# Patient Record
Sex: Male | Born: 1975 | Race: Black or African American | Hispanic: No | Marital: Married | Smoking: Former smoker
Health system: Southern US, Community
[De-identification: ages and names within clinical notes are randomized; demographics above are authoritative.]

## PROBLEM LIST (undated history)

## (undated) ENCOUNTER — Emergency Department (HOSPITAL_COMMUNITY): Payer: Managed Care, Other (non HMO)

## (undated) DIAGNOSIS — E119 Type 2 diabetes mellitus without complications: Secondary | ICD-10-CM

## (undated) DIAGNOSIS — L0390 Cellulitis, unspecified: Secondary | ICD-10-CM

## (undated) DIAGNOSIS — D869 Sarcoidosis, unspecified: Secondary | ICD-10-CM

## (undated) DIAGNOSIS — Z87442 Personal history of urinary calculi: Secondary | ICD-10-CM

## (undated) DIAGNOSIS — M549 Dorsalgia, unspecified: Secondary | ICD-10-CM

## (undated) DIAGNOSIS — I10 Essential (primary) hypertension: Secondary | ICD-10-CM

## (undated) HISTORY — DX: Cellulitis, unspecified: L03.90

## (undated) HISTORY — PX: OTHER SURGICAL HISTORY: SHX169

## (undated) HISTORY — DX: Dorsalgia, unspecified: M54.9

---

## 2012-07-08 ENCOUNTER — Encounter (HOSPITAL_BASED_OUTPATIENT_CLINIC_OR_DEPARTMENT_OTHER): Payer: Self-pay | Admitting: Emergency Medicine

## 2012-07-08 ENCOUNTER — Emergency Department (HOSPITAL_BASED_OUTPATIENT_CLINIC_OR_DEPARTMENT_OTHER)
Admission: EM | Admit: 2012-07-08 | Discharge: 2012-07-08 | Disposition: A | Discharge status: Home / Self Care | Payer: Self-pay | Attending: Emergency Medicine | Admitting: Emergency Medicine

## 2012-07-08 DIAGNOSIS — R109 Unspecified abdominal pain: Secondary | ICD-10-CM | POA: Insufficient documentation

## 2012-07-08 DIAGNOSIS — E860 Dehydration: Secondary | ICD-10-CM | POA: Insufficient documentation

## 2012-07-08 DIAGNOSIS — R197 Diarrhea, unspecified: Secondary | ICD-10-CM | POA: Insufficient documentation

## 2012-07-08 DIAGNOSIS — K529 Noninfective gastroenteritis and colitis, unspecified: Secondary | ICD-10-CM

## 2012-07-08 DIAGNOSIS — K5289 Other specified noninfective gastroenteritis and colitis: Secondary | ICD-10-CM | POA: Insufficient documentation

## 2012-07-08 LAB — CBC WITH DIFFERENTIAL/PLATELET
HCT: 47.2 % (ref 39.0–52.0)
Hemoglobin: 16.2 g/dL (ref 13.0–17.0)
Lymphocytes Relative: 38 % (ref 12–46)
Lymphs Abs: 1.9 10*3/uL (ref 0.7–4.0)
Monocytes Relative: 18 % — ABNORMAL HIGH (ref 3–12)
Neutro Abs: 2.2 10*3/uL (ref 1.7–7.7)
Neutrophils Relative %: 44 % (ref 43–77)
RBC: 6.17 MIL/uL — ABNORMAL HIGH (ref 4.22–5.81)

## 2012-07-08 LAB — URINALYSIS, ROUTINE W REFLEX MICROSCOPIC
Glucose, UA: NEGATIVE mg/dL
Leukocytes, UA: NEGATIVE
Nitrite: NEGATIVE
Specific Gravity, Urine: 1.031 — ABNORMAL HIGH (ref 1.005–1.030)
pH: 5.5 (ref 5.0–8.0)

## 2012-07-08 LAB — URINE MICROSCOPIC-ADD ON

## 2012-07-08 LAB — BASIC METABOLIC PANEL
BUN: 10 mg/dL (ref 6–23)
CO2: 25 mEq/L (ref 19–32)
Chloride: 100 mEq/L (ref 96–112)
GFR calc Af Amer: 82 mL/min — ABNORMAL LOW (ref >90)
Glucose, Bld: 99 mg/dL (ref 70–99)
Potassium: 4 mEq/L (ref 3.5–5.1)

## 2012-07-08 MED ORDER — IBUPROFEN 600 MG PO TABS: 600.0000 mg | ORAL_TABLET | Freq: Four times a day (QID) | ORAL | Status: DC | PRN

## 2012-07-08 MED ORDER — SODIUM CHLORIDE 0.9 % IV BOLUS (SEPSIS)
1000.0000 mL | Freq: Once | INTRAVENOUS | Status: AC
  Administered 2012-07-08: 1000 mL via INTRAVENOUS

## 2012-07-08 NOTE — ED Notes (Signed)
Vomiting and diarrhea since 07/06/12.  Also c/o chills, body aches, and fever on 07/06/12.  Pt. has taken Dayquil and Nyquil without relief.  Pt's son dx. with viral illness last Friday.

## 2012-07-08 NOTE — ED Provider Notes (Signed)
History     CSN: [id number]  Arrival date & time 07/08/12  1009   First MD Initiated Contact with Patient 07/08/12 1024      Chief Complaint  Patient presents with  . Emesis  . Diarrhea    (Consider location/radiation/quality/duration/timing/severity/associated sxs/prior treatment) HPI Comments: 37 y/o male with no medical hx comes in with cc of diarrhea, emesis. Pt started getting on Saturday. He has had between 5-10 loose, watery, non bloody BM since then, last BM being last night and about 2 episodes of emesis. He has no nausea, no hx of recent AB. Pt does have children with similar complains in the house.\ Pt has poor appetite, and does admit to feeling slightly dizzy when he gets up.  Patient is a 38 y.o. male presenting with vomiting and diarrhea. The history is provided by the patient.  Emesis  Associated symptoms include abdominal pain and diarrhea. Pertinent negatives include no cough.  Diarrhea The primary symptoms include fatigue, abdominal pain, vomiting and diarrhea. Primary symptoms do not include dysuria.    History reviewed. No pertinent past medical history.  History reviewed. No pertinent past surgical history.  No family history on file.  History  Substance Use Topics  . Smoking status: Never Smoker   . Smokeless tobacco: Not on file  . Alcohol Use: No      Review of Systems  Constitutional: Positive for fatigue. Negative for activity change and appetite change.  Respiratory: Negative for cough and shortness of breath.   Cardiovascular: Negative for chest pain.  Gastrointestinal: Positive for vomiting, abdominal pain and diarrhea.  Genitourinary: Negative for dysuria.  Neurological: Positive for dizziness.    Allergies  Review of patient's allergies indicates not on file.  Home Medications   Current Outpatient Rx  Name  Route  Sig  Dispense  Refill  . IBUPROFEN 600 MG PO TABS   Oral   Take 1 tablet (600 mg total) by mouth every 6 (six)  hours as needed for pain.   30 tablet   0     BP 123/93  Pulse 88  Temp 98.6 F (37 C) (Oral)  Resp 18  Ht 5\' 11"  (1.803 m)  Wt 204 lb (92.534 kg)  BMI 28.45 kg/m2  SpO2 100%  Physical Exam  Nursing note and vitals reviewed. Constitutional: He is oriented to person, place, and time. He appears well-developed.  HENT:  Head: Normocephalic and atraumatic.  Eyes: Conjunctivae normal and EOM are normal. Pupils are equal, round, and reactive to light.  Neck: Normal range of motion. Neck supple.  Cardiovascular: Normal rate and regular rhythm.   Pulmonary/Chest: Effort normal and breath sounds normal.  Abdominal: Soft. Bowel sounds are normal. He exhibits no distension. There is tenderness. There is no rebound and no guarding.       Pt has some lower quadrant, diffuse tenderness, mild - no rebound or guarding.  Neurological: He is alert and oriented to person, place, and time.  Skin: Skin is warm.    ED Course  Procedures (including critical care time)  Labs Reviewed  URINALYSIS, ROUTINE W REFLEX MICROSCOPIC - Abnormal; Notable for the following:    Specific Gravity, Urine 1.031 (*)     Hgb urine dipstick SMALL (*)     Bilirubin Urine SMALL (*)     Ketones, ur 15 (*)     Protein, ur 30 (*)     All other components within normal limits  CBC WITH DIFFERENTIAL - Abnormal; Notable for the following:  RBC 6.17 (*)     MCV 76.5 (*)     Monocytes Relative 18 (*)     All other components within normal limits  BASIC METABOLIC PANEL - Abnormal; Notable for the following:    GFR calc non Af Amer 70 (*)     GFR calc Af Amer 82 (*)     All other components within normal limits  URINE MICROSCOPIC-ADD ON - Abnormal; Notable for the following:    Bacteria, UA FEW (*)     Casts WBC CAST (*)     All other components within normal limits  URINE CULTURE   No results found.   1. Gastroenteritis   2. Dehydration       MDM  Pt comes in with cc of abd pain. Pt has diarrhea,  some emesis. He appears slightly dehydrated - we will hydrate in the ED. No emesis since Saturday night. No indication for stool studies per hx at this time, unless the [facility] is severely elevated -  No antibiotics.   Derwood Kaplan, MD 07/08/12 1138

## 2012-07-08 NOTE — ED Notes (Signed)
Pt. also c/o right shoulder pain that radiates to right lower back area since yesterday.

## 2012-07-10 LAB — URINE CULTURE: Culture: NO GROWTH

## 2012-09-19 ENCOUNTER — Emergency Department (HOSPITAL_BASED_OUTPATIENT_CLINIC_OR_DEPARTMENT_OTHER)
Admission: EM | Admit: 2012-09-19 | Discharge: 2012-09-19 | Disposition: A | Discharge status: Home / Self Care | Payer: Self-pay | Attending: Emergency Medicine | Admitting: Emergency Medicine

## 2012-09-19 ENCOUNTER — Encounter (HOSPITAL_BASED_OUTPATIENT_CLINIC_OR_DEPARTMENT_OTHER): Payer: Self-pay | Admitting: Emergency Medicine

## 2012-09-19 DIAGNOSIS — Y9389 Activity, other specified: Secondary | ICD-10-CM | POA: Insufficient documentation

## 2012-09-19 DIAGNOSIS — X500XXA Overexertion from strenuous movement or load, initial encounter: Secondary | ICD-10-CM | POA: Insufficient documentation

## 2012-09-19 DIAGNOSIS — Z87828 Personal history of other (healed) physical injury and trauma: Secondary | ICD-10-CM | POA: Insufficient documentation

## 2012-09-19 DIAGNOSIS — Y92009 Unspecified place in unspecified non-institutional (private) residence as the place of occurrence of the external cause: Secondary | ICD-10-CM | POA: Insufficient documentation

## 2012-09-19 DIAGNOSIS — S39012A Strain of muscle, fascia and tendon of lower back, initial encounter: Secondary | ICD-10-CM

## 2012-09-19 DIAGNOSIS — S335XXA Sprain of ligaments of lumbar spine, initial encounter: Secondary | ICD-10-CM | POA: Insufficient documentation

## 2012-09-19 MED ORDER — HYDROCODONE-ACETAMINOPHEN 5-325 MG PO TABS: 1.0000 | ORAL_TABLET | ORAL | Status: DC | PRN

## 2012-09-19 MED ORDER — CYCLOBENZAPRINE HCL 10 MG PO TABS: 10.0000 mg | ORAL_TABLET | Freq: Three times a day (TID) | ORAL | Status: DC | PRN

## 2012-09-19 NOTE — ED Notes (Signed)
MD at bedside. 
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
## 2013-01-13 ENCOUNTER — Encounter (HOSPITAL_BASED_OUTPATIENT_CLINIC_OR_DEPARTMENT_OTHER): Payer: Self-pay

## 2013-01-13 ENCOUNTER — Emergency Department (HOSPITAL_BASED_OUTPATIENT_CLINIC_OR_DEPARTMENT_OTHER)
Admission: EM | Admit: 2013-01-13 | Discharge: 2013-01-13 | Disposition: A | Discharge status: Home / Self Care | Payer: Self-pay | Attending: Emergency Medicine | Admitting: Emergency Medicine

## 2013-01-13 DIAGNOSIS — K029 Dental caries, unspecified: Secondary | ICD-10-CM | POA: Insufficient documentation

## 2013-01-13 DIAGNOSIS — K089 Disorder of teeth and supporting structures, unspecified: Secondary | ICD-10-CM | POA: Insufficient documentation

## 2013-01-13 MED ORDER — PENICILLIN V POTASSIUM 500 MG PO TABS: 500.0000 mg | ORAL_TABLET | Freq: Four times a day (QID) | ORAL | Status: DC

## 2013-01-13 MED ORDER — HYDROCODONE-ACETAMINOPHEN 5-325 MG PO TABS: ORAL_TABLET | ORAL | Status: DC

## 2013-01-13 MED ORDER — IBUPROFEN 800 MG PO TABS: 800.0000 mg | ORAL_TABLET | Freq: Three times a day (TID) | ORAL | Status: DC

## 2013-01-13 NOTE — ED Provider Notes (Signed)
Medical screening examination/treatment/procedure(s) were performed by non-physician practitioner and as supervising physician I was immediately available for consultation/collaboration.  Doug Sou, MD 01/13/13 1137

## 2013-01-13 NOTE — ED Provider Notes (Signed)
CSN: [id number]     Arrival date & time 01/13/13  1007 History     First MD Initiated Contact with Patient 01/13/13 1009     Chief Complaint  Patient presents with  . Dental Pain   (Consider location/radiation/quality/duration/timing/severity/associated sxs/prior Treatment) HPI Pt is a 37yo male c/o 1wk hx of left lower dental pain that is aching and throbbing, 10/10, worse with chewing.  States he did see dentist in [place] which quoted him amount for tooth extraction.  Pt states it is too expensive right now, he does get paid next week and believes he can go then to have to taken out.  Would like some pain medication to get him through until he gets paid.  Denies fever, n/v/d. Denies difficulty breathing or swallowing.    History reviewed. No pertinent past medical history. History reviewed. No pertinent past surgical history. No family history on file. History  Substance Use Topics  . Smoking status: Never Smoker   . Smokeless tobacco: Not on file  . Alcohol Use: No    Review of Systems  Constitutional: Negative for fever and chills.  HENT: Positive for dental problem.   All other systems reviewed and are negative.    Allergies  Review of patient's allergies indicates no known allergies.  Home Medications   Current Outpatient Rx  Name  Route  Sig  Dispense  Refill  . HYDROcodone-acetaminophen (NORCO/VICODIN) 5-325 MG per tablet      Take 1-2 pills every 4-6 hours as needed for pain   10 tablet   0   . ibuprofen (ADVIL,MOTRIN) 800 MG tablet   Oral   Take 1 tablet (800 mg total) by mouth 3 (three) times daily.   21 tablet   0   . penicillin v potassium (VEETID) 500 MG tablet   Oral   Take 1 tablet (500 mg total) by mouth 4 (four) times daily.   40 tablet   0    BP 121/70  Pulse 60  Temp(Src) 98.4 F (36.9 C) (Oral)  Resp 18  Ht 5\' 11"  (1.803 m)  Wt 202 lb (91.627 kg)  BMI 28.19 kg/m2  SpO2 100% Physical Exam  Nursing note and vitals  reviewed. Constitutional: He is oriented to person, place, and time. He appears well-developed and well-nourished.  HENT:  Head: Normocephalic and atraumatic.  Right Ear: Hearing, tympanic membrane, external ear and ear canal normal.  Left Ear: Hearing, tympanic membrane, external ear and ear canal normal.  Nose: Nose normal.  Mouth/Throat: Uvula is midline and oropharynx is clear and moist. Abnormal dentition. Dental caries present. No dental abscesses or edematous.    Tooth #18, cracked down to dentin.  No obvious abscess. No bleeding or drainage.  TTP.  Eyes: EOM are normal.  Neck: Normal range of motion.  Cardiovascular: Normal rate.   Pulmonary/Chest: Effort normal.  Musculoskeletal: Normal range of motion.  Neurological: He is alert and oriented to person, place, and time.  Skin: Skin is warm and dry.  Psychiatric: He has a normal mood and affect. His behavior is normal.    ED Course   Procedures (including critical care time)  Labs Reviewed - No data to display No results found. 1. Pain due to dental caries     MDM  Pt has dental pain with cracked tooth, waiting to get paid to have it removed.   Rx: norco and PCN. Will discharge pt home and have her f/u with [facility] and [facility] info provided. Return  precautions given. Dental References provided. Pt verbalized understanding and agreement with tx plan. Vitals: unremarkable. Discharged in stable condition.      Junius Finner, PA-C 01/13/13 1100

## 2013-01-13 NOTE — ED Notes (Signed)
Pt reports dental pain x 1 week unrelieved after taking Tylenol and Motrin.

## 2013-04-10 ENCOUNTER — Emergency Department (HOSPITAL_BASED_OUTPATIENT_CLINIC_OR_DEPARTMENT_OTHER)
Admission: EM | Admit: 2013-04-10 | Discharge: 2013-04-10 | Disposition: A | Discharge status: Home / Self Care | Payer: Self-pay | Attending: Emergency Medicine | Admitting: Emergency Medicine

## 2013-04-10 ENCOUNTER — Emergency Department (HOSPITAL_BASED_OUTPATIENT_CLINIC_OR_DEPARTMENT_OTHER): Payer: Self-pay

## 2013-04-10 ENCOUNTER — Encounter (HOSPITAL_BASED_OUTPATIENT_CLINIC_OR_DEPARTMENT_OTHER): Payer: Self-pay | Admitting: Emergency Medicine

## 2013-04-10 DIAGNOSIS — Z792 Long term (current) use of antibiotics: Secondary | ICD-10-CM | POA: Insufficient documentation

## 2013-04-10 DIAGNOSIS — R51 Headache: Secondary | ICD-10-CM | POA: Insufficient documentation

## 2013-04-10 DIAGNOSIS — Z791 Long term (current) use of non-steroidal anti-inflammatories (NSAID): Secondary | ICD-10-CM | POA: Insufficient documentation

## 2013-04-10 DIAGNOSIS — R52 Pain, unspecified: Secondary | ICD-10-CM | POA: Insufficient documentation

## 2013-04-10 DIAGNOSIS — R1110 Vomiting, unspecified: Secondary | ICD-10-CM | POA: Insufficient documentation

## 2013-04-10 MED ORDER — DIPHENHYDRAMINE HCL 50 MG/ML IJ SOLN
25.0000 mg | Freq: Once | INTRAMUSCULAR | Status: AC
  Administered 2013-04-10: 25 mg via INTRAVENOUS
  Filled 2013-04-10: qty 1

## 2013-04-10 MED ORDER — SODIUM CHLORIDE 0.9 % IV BOLUS (SEPSIS)
1000.0000 mL | Freq: Once | INTRAVENOUS | Status: AC
  Administered 2013-04-10: 1000 mL via INTRAVENOUS

## 2013-04-10 MED ORDER — METOCLOPRAMIDE HCL 5 MG/ML IJ SOLN
10.0000 mg | Freq: Once | INTRAMUSCULAR | Status: AC
  Administered 2013-04-10: 10 mg via INTRAVENOUS
  Filled 2013-04-10: qty 2

## 2013-04-10 NOTE — ED Notes (Signed)
Sudden onset of headache. He vomited afterward. Took Aleve but vomited afterward.

## 2013-04-10 NOTE — ED Provider Notes (Signed)
CSN: [id number]     Arrival date & time 04/10/13  1142 History   First MD Initiated Contact with Patient 04/10/13 1155     Chief Complaint  Patient presents with  . Headache   (Consider location/radiation/quality/duration/timing/severity/associated sxs/prior Treatment) HPI Comments: 37 year old male with an acute headache started about 2 hours ago. He was walking in the store looking for new socks for his son when the pain hit him all of a sudden. He states the pain increased in intensity over the next hour. When he got home the pain was as worst. It was  at this point was generalized. He took Aleve and then subsequently vomited. Since has not felt any nausea. The pain has subsided down to a 7 or 8 at this time. His mostly located on his right parietal head. Denies any neck tenderness, neck pain, photophobia, or blurry vision. Denies any fevers or chills. Denies any weakness or numbness. His grandfather died of an aneurysm recently.   History reviewed. No pertinent past medical history. History reviewed. No pertinent past surgical history. No family history on file. History  Substance Use Topics  . Smoking status: Never Smoker   . Smokeless tobacco: Not on file  . Alcohol Use: Yes     Comment: occassionally     Review of Systems  Constitutional: Negative for fever and chills.  Gastrointestinal: Positive for vomiting (once). Negative for nausea.  Musculoskeletal: Negative for neck pain and neck stiffness.  Neurological: Positive for headaches. Negative for syncope, speech difficulty, weakness and numbness.  All other systems reviewed and are negative.    Allergies  Review of patient's allergies indicates no known allergies.  Home Medications   Current Outpatient Rx  Name  Route  Sig  Dispense  Refill  . HYDROcodone-acetaminophen (NORCO/VICODIN) 5-325 MG per tablet      Take 1-2 pills every 4-6 hours as needed for pain   10 tablet   0   . ibuprofen (ADVIL,MOTRIN) 800 MG  tablet   Oral   Take 1 tablet (800 mg total) by mouth 3 (three) times daily.   21 tablet   0   . penicillin v potassium (VEETID) 500 MG tablet   Oral   Take 1 tablet (500 mg total) by mouth 4 (four) times daily.   40 tablet   0    BP 138/88  Pulse 79  Temp(Src) 98.4 F (36.9 C) (Oral)  Resp 16  SpO2 100% Physical Exam  Nursing note and vitals reviewed. Constitutional: He is oriented to person, place, and time. He appears well-developed and well-nourished. No distress.  HENT:  Head: Normocephalic and atraumatic.  Right Ear: External ear normal.  Left Ear: External ear normal.  Nose: Nose normal.  Mouth/Throat: Oropharynx is clear and moist. No oropharyngeal exudate.  No tenderness to head and scalp  Eyes: EOM are normal. Pupils are equal, round, and reactive to light. Right eye exhibits no discharge. Left eye exhibits no discharge.  Neck: Normal range of motion and full passive range of motion without pain. Neck supple. No rigidity.  Cardiovascular: Normal rate, regular rhythm, normal heart sounds and intact distal pulses.   Pulmonary/Chest: Effort normal and breath sounds normal.  Abdominal: Soft. There is no tenderness.  Musculoskeletal: He exhibits no edema.  Neurological: He is alert and oriented to person, place, and time. He has normal strength. No cranial nerve deficit or sensory deficit. He exhibits normal muscle tone. GCS eye subscore is 4. GCS verbal subscore is 5. GCS motor  subscore is 6.  Skin: Skin is warm and dry.    ED Course  Procedures (including critical care time) Labs Review Labs Reviewed - No data to display Imaging Review Ct Head Wo Contrast  04/10/2013   CLINICAL DATA:  Sudden onset headache. Family history of aneurysm.  EXAM: CT HEAD WITHOUT CONTRAST  TECHNIQUE: Contiguous axial images were obtained from the base of the skull through the vertex without intravenous contrast.  COMPARISON:  None.  FINDINGS: No acute intracranial hemorrhage. No focal  mass lesion. No CT evidence of acute infarction. No midline shift or mass effect. No hydrocephalus. Basilar cisterns are patent. Paranasal sinuses and mastoid air cells are clear.  IMPRESSION: Normal head CT.   Electronically Signed   By: Genevive Bi M.D.   On: 04/10/2013 12:48    EKG Interpretation   None       MDM   1. Acute headache    Patient appears well here, headache improved significantly prior to arrival. CT head neg for acute blood. Unlikely to be infectious cause based on history and exam. Pain resolved with treatment. I discussed good sensitivity of CT but to fully r/o SAH would need LP. Patient declines and understands that this involves small risk of missing a small SAH. Discussed return precautions with patient who verbalized understanding.    Audree Camel, MD 04/11/13 1005

## 2013-08-30 ENCOUNTER — Encounter (HOSPITAL_BASED_OUTPATIENT_CLINIC_OR_DEPARTMENT_OTHER): Payer: Self-pay | Admitting: Emergency Medicine

## 2013-08-30 ENCOUNTER — Emergency Department (HOSPITAL_BASED_OUTPATIENT_CLINIC_OR_DEPARTMENT_OTHER)
Admission: EM | Admit: 2013-08-30 | Discharge: 2013-08-30 | Disposition: A | Discharge status: Home / Self Care | Payer: Self-pay | Attending: Emergency Medicine | Admitting: Emergency Medicine

## 2013-08-30 DIAGNOSIS — G8929 Other chronic pain: Secondary | ICD-10-CM | POA: Insufficient documentation

## 2013-08-30 DIAGNOSIS — M545 Low back pain, unspecified: Secondary | ICD-10-CM | POA: Insufficient documentation

## 2013-08-30 DIAGNOSIS — H0289 Other specified disorders of eyelid: Secondary | ICD-10-CM | POA: Insufficient documentation

## 2013-08-30 DIAGNOSIS — H029 Unspecified disorder of eyelid: Secondary | ICD-10-CM

## 2013-08-30 DIAGNOSIS — M549 Dorsalgia, unspecified: Secondary | ICD-10-CM

## 2013-08-30 MED ORDER — TOBRAMYCIN-DEXAMETHASONE 0.3-0.1 % OP SUSP: 1.0000 [drp] | OPHTHALMIC | Status: DC

## 2013-08-30 MED ORDER — TRAMADOL HCL 50 MG PO TABS: 50.0000 mg | ORAL_TABLET | Freq: Four times a day (QID) | ORAL | Status: DC | PRN

## 2013-08-30 MED ORDER — NAPROXEN 500 MG PO TABS: 500.0000 mg | ORAL_TABLET | Freq: Two times a day (BID) | ORAL | Status: DC

## 2013-08-30 MED ORDER — METHOCARBAMOL 500 MG PO TABS: 500.0000 mg | ORAL_TABLET | Freq: Two times a day (BID) | ORAL | Status: DC

## 2013-08-30 NOTE — ED Notes (Signed)
MD at bedside. 

## 2013-08-30 NOTE — ED Notes (Signed)
Patient here with right upper eyelid swelling x 1 month, denies pain, denies trauma. Also complains of ongoing lower back pain, reports herniated disc that is acting up, no deficits noted
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
## 2013-08-30 NOTE — Discharge Instructions (Signed)
You have a mass protruding from the tissue of the right eyelid that'll require ophthalmology evaluation. Use the drops as recommended until your ophthalmology appointment. Clinic given to you as a followup for pain management is one of several in town. You may choose any you like to contact for followup  Chronic Back Pain  When back pain lasts longer than 3 months, it is called chronic back pain.People with chronic back pain often go through certain periods that are more intense (flare-ups).  CAUSES Chronic back pain can be caused by wear and tear (degeneration) on different structures in your back. These structures include:  The bones of your spine (vertebrae) and the joints surrounding your spinal cord and nerve roots (facets).  The strong, fibrous tissues that connect your vertebrae (ligaments). Degeneration of these structures may result in pressure on your nerves. This can lead to constant pain. HOME CARE INSTRUCTIONS  Avoid bending, heavy lifting, prolonged sitting, and activities which make the problem worse.  Take brief periods of rest throughout the day to reduce your pain. Lying down or standing usually is better than sitting while you are resting.  Take over-the-counter or prescription medicines only as directed by your caregiver. SEEK IMMEDIATE MEDICAL CARE IF:   You have weakness or numbness in one of your legs or feet.  You have trouble controlling your bladder or bowels.  You have nausea, vomiting, abdominal pain, shortness of breath, or fainting. Document Released: 07/13/2004 Document Revised: 08/28/2011 Document Reviewed: 05/20/2011 [facility] Patient Information 2014 [place], [place].

## 2014-10-03 ENCOUNTER — Encounter (HOSPITAL_BASED_OUTPATIENT_CLINIC_OR_DEPARTMENT_OTHER): Payer: Self-pay

## 2014-10-03 ENCOUNTER — Emergency Department (HOSPITAL_BASED_OUTPATIENT_CLINIC_OR_DEPARTMENT_OTHER)
Admission: EM | Admit: 2014-10-03 | Discharge: 2014-10-03 | Disposition: A | Discharge status: Home / Self Care | Payer: Self-pay | Attending: Emergency Medicine | Admitting: Emergency Medicine

## 2014-10-03 DIAGNOSIS — H5711 Ocular pain, right eye: Secondary | ICD-10-CM | POA: Insufficient documentation

## 2014-10-03 DIAGNOSIS — R630 Anorexia: Secondary | ICD-10-CM | POA: Insufficient documentation

## 2014-10-03 DIAGNOSIS — B349 Viral infection, unspecified: Secondary | ICD-10-CM | POA: Insufficient documentation

## 2014-10-03 DIAGNOSIS — Z79899 Other long term (current) drug therapy: Secondary | ICD-10-CM | POA: Insufficient documentation

## 2014-10-03 DIAGNOSIS — Z791 Long term (current) use of non-steroidal anti-inflammatories (NSAID): Secondary | ICD-10-CM | POA: Insufficient documentation

## 2014-10-03 DIAGNOSIS — Z720 Tobacco use: Secondary | ICD-10-CM | POA: Insufficient documentation

## 2014-10-03 HISTORY — DX: Sarcoidosis, unspecified: D86.9

## 2014-10-03 MED ORDER — IBUPROFEN 800 MG PO TABS
800.0000 mg | ORAL_TABLET | Freq: Once | ORAL | Status: AC
  Administered 2014-10-03: 800 mg via ORAL
  Filled 2014-10-03: qty 1

## 2014-10-03 NOTE — ED Provider Notes (Signed)
CSN: [id number]     Arrival date & time 10/03/14  1904 History  This chart was scribed for Jerelyn Scott, MD by Abel Presto, ED Scribe. This patient was seen in room MH05/MH05 and the patient's care was started at 10:03 PM.    Chief Complaint  Patient presents with  . Fever    Patient is a 39 y.o. male presenting with fever. The history is provided by the patient. No language interpreter was used.  Fever Associated symptoms: chills, diarrhea and myalgias   Associated symptoms: no cough, no sore throat and no vomiting    HPI Comments: Brett Allen is a 39 y.o. male who presents to the Emergency Department complaining of subjective fever with onset 3 days ago. Pt notes associated bilateral eye pain, watery diarrhea x3 with onset today, body aches, chills, night sweats, and decreased appetite. Pt able to tolerate PO food and fluid intake. Pt has tried Nyquil for relief. Pt was seen the day before and given an injection in his right eye to treat sarcoidosis. Pt notes recent sick contacts. Pt denies visual changes, vomiting, sore throat, abdominal pain, and cough. Pt reports he is feeling better currently.   Past Medical History  Diagnosis Date  . Sarcoidosis     right eye   History reviewed. No pertinent past surgical history. History reviewed. No pertinent family history. History  Substance Use Topics  . Smoking status: Current Every Day Smoker  . Smokeless tobacco: Not on file  . Alcohol Use: Yes     Comment: occassionally     Review of Systems  Constitutional: Positive for fever, chills and appetite change.  HENT: Negative for sore throat.   Eyes: Positive for pain.  Respiratory: Negative for cough.   Gastrointestinal: Positive for diarrhea. Negative for vomiting and abdominal pain.  Musculoskeletal: Positive for myalgias.  ROS reviewed and all otherwise negative except for mentioned in HPI    Allergies  Review of patient's allergies indicates no known  allergies.  Home Medications   Prior to Admission medications   Medication Sig Start Date End Date Taking? Authorizing Provider  methocarbamol (ROBAXIN) 500 MG tablet Take 1 tablet (500 mg total) by mouth 2 (two) times daily. 08/30/13   Rolland Porter, MD  naproxen (NAPROSYN) 500 MG tablet Take 1 tablet (500 mg total) by mouth 2 (two) times daily. 08/30/13   Rolland Porter, MD  tobramycin-dexamethasone [facility]) ophthalmic solution Place 1 drop into the right eye every 4 (four) hours while awake. 08/30/13   Rolland Porter, MD  traMADol (ULTRAM) 50 MG tablet Take 1 tablet (50 mg total) by mouth every 6 (six) hours as needed. 08/30/13   Rolland Porter, MD   BP 127/85 mmHg  Pulse 96  Temp(Src) 101.4 F (38.6 C) (Oral)  Resp 16  Ht  (1.803 m)  Wt 195 lb (88.451 kg)  BMI 27.21 kg/m2  SpO2 99% Physical Exam  Constitutional: He is oriented to person, place, and time. He appears well-developed and well-nourished.  HENT:  Head: Normocephalic.  Eyes: Conjunctivae are normal.  Neck: Normal range of motion. Neck supple.  Pulmonary/Chest: Effort normal.  Musculoskeletal: Normal range of motion.  Neurological: He is alert and oriented to person, place, and time.  Skin: Skin is warm and dry.  Psychiatric: He has a normal mood and affect. His behavior is normal.  Nursing note and vitals reviewed. Physical Examination: General appearance - alert, well appearing, and in no distress Mental status - alert, oriented to person, place,  and time Eyes - pupils equal and reactive, extraocular eye movements intact,no conjunctival injection no scleral icterus Mouth - mucous membranes moist, pharynx normal without lesions Chest - clear to auscultation, no wheezes, rales or rhonchi, symmetric air entry Heart - normal rate, regular rhythm, normal S1, S2, no murmurs, rubs, clicks or gallops Abdomen - soft, nontender, nondistended, no masses or organomegaly, nabs Extremities - peripheral pulses normal, no pedal edema, no  clubbing or cyanosis Skin - normal coloration and turgor, no rashes  ED Course  Procedures (including critical care time) DIAGNOSTIC STUDIES: Oxygen Saturation is 99% on room air, normal by my interpretation.    COORDINATION OF CARE: 10:13 PM Discussed treatment plan with patient at beside, the patient agrees with the plan and has no further questions at this time.   Labs Review Labs Reviewed - No data to display  Imaging Review No results found.   EKG Interpretation None     MDM   Final diagnoses:  Viral infection   Pt presenting with c/o diffuse myalgias, diarrhea, subjective fever/chills- pt had steroid injection in right eye several days ago for sarcoidosis- no changes in vision, eye exam is reassuring.  Doubt this is related to steroids- more likely viral infection.   Pt feels improved after ibuprofen.  D/w patient about symptomatic care, rest, OTC meds.  Discharged with strict return precautions.  Pt agreeable with plan.  I personally performed the services described in this documentation, which was scribed in my presence. The recorded information has been reviewed and is accurate.     Jerelyn ScottMartha Linker, MD 10/04/14 2120

## 2014-10-03 NOTE — ED Notes (Signed)
Pt with fever, night sweats, body aches, headache - pt thinks he is having an allergic reaction to a steroid injection on Monday in his right eye for sarcoidosis - states he forgot the name of medication.

## 2014-10-03 NOTE — Discharge Instructions (Signed)
Return to the ED with any concerns including difficulty breathing, vomiting and not able to keep down liquids, abdominal pain- especially if it localizes to the right lower abdomen, changes in vision, decreased level of alertness/lethargy, or any other alarming symptoms

## 2015-06-20 HISTORY — PX: WRIST FRACTURE SURGERY: SHX121

## 2015-07-09 DIAGNOSIS — E785 Hyperlipidemia, unspecified: Secondary | ICD-10-CM | POA: Insufficient documentation

## 2015-07-23 DIAGNOSIS — M545 Low back pain: Secondary | ICD-10-CM

## 2015-07-23 DIAGNOSIS — G8929 Other chronic pain: Secondary | ICD-10-CM | POA: Insufficient documentation

## 2015-10-29 DIAGNOSIS — D8689 Sarcoidosis of other sites: Secondary | ICD-10-CM | POA: Insufficient documentation

## 2016-10-07 ENCOUNTER — Encounter (HOSPITAL_BASED_OUTPATIENT_CLINIC_OR_DEPARTMENT_OTHER): Payer: Self-pay | Admitting: *Deleted

## 2016-10-07 ENCOUNTER — Emergency Department (HOSPITAL_BASED_OUTPATIENT_CLINIC_OR_DEPARTMENT_OTHER)
Admission: EM | Admit: 2016-10-07 | Discharge: 2016-10-07 | Disposition: A | Discharge status: Home / Self Care | Payer: Self-pay | Attending: Emergency Medicine | Admitting: Emergency Medicine

## 2016-10-07 DIAGNOSIS — F17200 Nicotine dependence, unspecified, uncomplicated: Secondary | ICD-10-CM | POA: Insufficient documentation

## 2016-10-07 DIAGNOSIS — H8112 Benign paroxysmal vertigo, left ear: Secondary | ICD-10-CM | POA: Insufficient documentation

## 2016-10-07 DIAGNOSIS — I10 Essential (primary) hypertension: Secondary | ICD-10-CM | POA: Insufficient documentation

## 2016-10-07 HISTORY — DX: Essential (primary) hypertension: I10

## 2016-10-07 LAB — COMPREHENSIVE METABOLIC PANEL
ALK PHOS: 63 U/L (ref 38–126)
ALT: 64 U/L — ABNORMAL HIGH (ref 17–63)
AST: 78 U/L — ABNORMAL HIGH (ref 15–41)
Albumin: 4.5 g/dL (ref 3.5–5.0)
Anion gap: 14 (ref 5–15)
BUN: 20 mg/dL (ref 6–20)
CALCIUM: 9.6 mg/dL (ref 8.9–10.3)
CO2: 24 mmol/L (ref 22–32)
CREATININE: 1.45 mg/dL — AB (ref 0.61–1.24)
Chloride: 100 mmol/L — ABNORMAL LOW (ref 101–111)
GFR, EST NON AFRICAN AMERICAN: 60 mL/min — AB (ref >60)
Glucose, Bld: 119 mg/dL — ABNORMAL HIGH (ref 65–99)
Potassium: 3.4 mmol/L — ABNORMAL LOW (ref 3.5–5.1)
SODIUM: 138 mmol/L (ref 135–145)
TOTAL PROTEIN: 8.3 g/dL — AB (ref 6.5–8.1)
Total Bilirubin: 0.6 mg/dL (ref 0.3–1.2)

## 2016-10-07 LAB — CBC WITH DIFFERENTIAL/PLATELET
BASOS ABS: 0 10*3/uL (ref 0.0–0.1)
Basophils Relative: 0 %
EOS PCT: 1 %
Eosinophils Absolute: 0.1 10*3/uL (ref 0.0–0.7)
HCT: 46.4 % (ref 39.0–52.0)
HEMOGLOBIN: 15.8 g/dL (ref 13.0–17.0)
LYMPHS ABS: 3.3 10*3/uL (ref 0.7–4.0)
Lymphocytes Relative: 56 %
MCH: 26.4 pg (ref 26.0–34.0)
MCHC: 34.1 g/dL (ref 30.0–36.0)
MCV: 77.6 fL — AB (ref 78.0–100.0)
Monocytes Absolute: 0.6 10*3/uL (ref 0.1–1.0)
Monocytes Relative: 10 %
NEUTROS PCT: 33 %
Neutro Abs: 1.9 10*3/uL (ref 1.7–7.7)
PLATELETS: 284 10*3/uL (ref 150–400)
RBC: 5.98 MIL/uL — AB (ref 4.22–5.81)
RDW: 14.2 % (ref 11.5–15.5)
WBC: 5.9 10*3/uL (ref 4.0–10.5)

## 2016-10-07 LAB — TROPONIN I: Troponin I: <0.03 ng/mL (ref <0.03)

## 2016-10-07 MED ORDER — MECLIZINE HCL 25 MG PO TABS: 25.0000 mg | ORAL_TABLET | Freq: Three times a day (TID) | ORAL | 0 refills | Status: DC | PRN

## 2016-10-07 MED ORDER — MECLIZINE HCL 25 MG PO TABS
25.0000 mg | ORAL_TABLET | Freq: Once | ORAL | Status: AC
  Administered 2016-10-07: 25 mg via ORAL
  Filled 2016-10-07: qty 1

## 2016-10-07 NOTE — ED Notes (Signed)
Orthostatic VS..Standing at 3 minutes: BP- 135/106 Pulse-94

## 2016-10-07 NOTE — ED Notes (Signed)
Pt given d/c instructions as per chart. Verbalizes understanding. No questions. 

## 2016-10-07 NOTE — ED Provider Notes (Signed)
[facility] Provider Note   CSN: [id number] Arrival date & time: 10/07/16  1856   By signing my name below, I, Doreatha Martin, attest that this documentation has been prepared under the direction and in the presence of Fayrene Helper, PA-C. Electronically Signed: Doreatha Martin, ED Scribe. 10/07/16. 7:55 PM.   History   Chief Complaint Chief Complaint  Patient presents with  . Dizziness   The history is provided by the patient. No language interpreter was used.    Brett Allen is a 41 y.o. male  who presents to the Emergency Department complaining of an episode of dizziness that lasted 30 seconds and occurred 1 hr PTA. 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 During interview, dizziness recurred after moving from a sitting to lying position. Pt states he was feeling otherwise well this week aside from experiencing mild throbbing right-sided HA, which is abnormal for him. Per pt, he has not been drinking enough water this week, but is eating as normal. No prolonged exposure to heat, recent changes in medications, alcohol or illicit drug use, He denies syncope, vomiting, diarrhea,rhinorrhea, sneezing, coughing, CP, back pain, SOB, abdominal pain. No h/o thyroid disease.    Past Medical History:  Diagnosis Date  . Hypertension   . Sarcoidosis    right eye    There are no active problems to display for this patient.   Past Surgical History:  Procedure Laterality Date  . WRIST FRACTURE SURGERY Left 2017       Home Medications    Prior to Admission medications   Medication Sig Start Date End Date Taking? Authorizing Provider  methocarbamol (ROBAXIN) 500 MG tablet Take 1 tablet (500 mg total) by mouth 2 (two) times daily. 08/30/13   Rolland Porter, MD  naproxen (NAPROSYN) 500 MG tablet Take 1 tablet (500 mg total) by mouth 2 (two) times daily. 08/30/13   Rolland Porter, MD  tobramycin-dexamethasone  [facility]) ophthalmic solution Place 1 drop into the right eye every 4 (four) hours while awake. 08/30/13   Rolland Porter, MD  traMADol (ULTRAM) 50 MG tablet Take 1 tablet (50 mg total) by mouth every 6 (six) hours as needed. 08/30/13   Rolland Porter, MD    Family History No family history on file.  Social History Social History  Substance Use Topics  . Smoking status: Current Some Day Smoker  . Smokeless tobacco: Never Used  . Alcohol use Yes     Comment: occassionally      Allergies   Patient has no known allergies.   Review of Systems Review of Systems  Constitutional: Negative for fever.  HENT: Negative for rhinorrhea, sneezing and tinnitus.   Eyes: Negative for visual disturbance.  Respiratory: Negative for cough and shortness of breath.   Cardiovascular: Negative for chest pain.  Gastrointestinal: Negative for abdominal pain.  Genitourinary: Negative for dysuria.  Musculoskeletal: Negative for back pain.  Skin: Negative for wound.  Neurological: Positive for dizziness, light-headedness and headaches. Negative for syncope.     Physical Exam Updated Vital Signs BP (!) 142/100 (BP Location: Left Arm)   Pulse 91   Temp 98.5 F (36.9 C) (Oral)   Resp 17   Ht  (1.803 m)   Wt 212 lb (96.2 kg)   SpO2 100%   BMI 29.57 kg/m   Physical Exam  Constitutional: He appears well-developed and well-nourished.  HENT:  Head: Normocephalic.  Right Ear: External ear normal.  Left Ear: External ear normal.  Mouth/Throat: Oropharynx is clear and moist.  Oral mucosa moist.   Eyes: Conjunctivae and EOM are normal. Pupils are equal, round, and reactive to light.  Cardiovascular: Normal rate, regular rhythm and normal heart sounds.  Exam reveals no gallop and no friction rub.   No murmur heard. Pulmonary/Chest: Effort normal and breath sounds normal. No respiratory distress. He has no wheezes. He has no rales.  Abdominal: He exhibits no distension.  Musculoskeletal: Normal range  of motion.  Neurological: He is alert.  Skin: Skin is warm and dry.  Psychiatric: He has a normal mood and affect. His behavior is normal.  Nursing note and vitals reviewed.    ED Treatments / Results  DIAGNOSTIC STUDIES: Oxygen Saturation is 100% on RA, NL by my interpretation.    COORDINATION OF CARE: 7:46 PM-Discussed next steps with pt which includes meclizine. Pt verbalized understanding and is agreeable with the plan.      Labs (all labs ordered are listed, but only abnormal results are displayed) Labs Reviewed  CBC WITH DIFFERENTIAL/PLATELET - Abnormal; Notable for the following:       Result Value   RBC 5.98 (*)    MCV 77.6 (*)    All other components within normal limits  COMPREHENSIVE METABOLIC PANEL - Abnormal; Notable for the following:    Potassium 3.4 (*)    Chloride 100 (*)    Glucose, Bld 119 (*)    Creatinine, Ser 1.45 (*)    Total Protein 8.3 (*)    AST 78 (*)    ALT 64 (*)    GFR calc non Af Amer 60 (*)    All other components within normal limits  TROPONIN I    EKG  EKG Interpretation  Date/Time:  Saturday October 07 2016 19:30:35 EDT Ventricular Rate:  88 PR Interval:    QRS Duration: 84 QT Interval:  342 QTC Calculation: 414 R Axis:   34 Text Interpretation:  Sinus rhythm Borderline T abnormalities, inferior leads No previous ECGs available Confirmed by LITTLE MD, RACHEL ([id number]) on 10/07/2016 9:06:09 PM     No CP  Radiology No results found.  Procedures Procedures (including critical care time)  Medications Ordered in ED Medications  meclizine (ANTIVERT) tablet 25 mg (25 mg Oral Given 10/07/16 1955)     Initial Impression / Assessment and Plan / ED Course  I have reviewed the triage vital signs and the nursing notes.  Pertinent labs & imaging results that were available during my care of the patient were reviewed by me and considered in my medical decision making (see chart for details).  Clinical Course as of Oct 08 2139  Sat  Oct 07, 2016  2138 Pt reevaluated and felt much improved. Plan to dc pt. Will dc with referral to HENT and meclizine to be used PRN. Discussed with him abnormal labs, including kidney and liver function.   [EM]    Clinical Course User Index [EM] Doreatha Martin    BP (!) 142/106   Pulse 66   Temp 98.5 F (36.9 C) (Oral)   Resp 15   Ht  (1.803 m)   Wt 96.2 kg   SpO2 99%   BMI 29.57 kg/m    Final Clinical Impressions(s) / ED Diagnoses   Final diagnoses:  Benign paroxysmal positional vertigo of left ear    New Prescriptions New Prescriptions   MECLIZINE (ANTIVERT) 25 MG TABLET    Take 1 tablet (25 mg total) by  mouth 3 (three) times daily as needed for dizziness.    I personally performed the services described in this documentation, which was scribed in my presence. The recorded information has been reviewed and is accurate.   Pt with dizziness with positional change.  Positive Gilberto Better towards the L ear.  Meclizine given.  Normal orthostatic vital sign.  Pt felt much better after treatment.  Labs remarkable for mild transaminitis.  Pt is not a drinker.  Evidence of AKI, IVF given.  Encourage outpt fu for further care.  Epley maneuver shown.  Return precaution discussed.    Fayrene Helper, PA-C 10/07/16 2143    Laurence Spates, MD 10/10/16 [phone]

## 2016-10-07 NOTE — ED Triage Notes (Signed)
Pt reports he was at work and got dizzy approx 1 hr PTA. Denies LOC/fall. Pt denies dizziness at this time. Denies n/v/d.

## 2018-02-06 ENCOUNTER — Emergency Department (HOSPITAL_BASED_OUTPATIENT_CLINIC_OR_DEPARTMENT_OTHER): Payer: Managed Care, Other (non HMO)

## 2018-02-06 ENCOUNTER — Emergency Department (HOSPITAL_BASED_OUTPATIENT_CLINIC_OR_DEPARTMENT_OTHER)
Admission: EM | Admit: 2018-02-06 | Discharge: 2018-02-06 | Disposition: A | Discharge status: Home / Self Care | Payer: Managed Care, Other (non HMO) | Attending: Emergency Medicine | Admitting: Emergency Medicine

## 2018-02-06 DIAGNOSIS — S46911A Strain of unspecified muscle, fascia and tendon at shoulder and upper arm level, right arm, initial encounter: Secondary | ICD-10-CM | POA: Insufficient documentation

## 2018-02-06 DIAGNOSIS — Y92003 Bedroom of unspecified non-institutional (private) residence as the place of occurrence of the external cause: Secondary | ICD-10-CM | POA: Insufficient documentation

## 2018-02-06 DIAGNOSIS — I10 Essential (primary) hypertension: Secondary | ICD-10-CM | POA: Diagnosis not present

## 2018-02-06 DIAGNOSIS — Y9384 Activity, sleeping: Secondary | ICD-10-CM | POA: Diagnosis not present

## 2018-02-06 DIAGNOSIS — S4991XA Unspecified injury of right shoulder and upper arm, initial encounter: Secondary | ICD-10-CM | POA: Diagnosis present

## 2018-02-06 DIAGNOSIS — X509XXA Other and unspecified overexertion or strenuous movements or postures, initial encounter: Secondary | ICD-10-CM | POA: Diagnosis not present

## 2018-02-06 DIAGNOSIS — F17200 Nicotine dependence, unspecified, uncomplicated: Secondary | ICD-10-CM | POA: Insufficient documentation

## 2018-02-06 DIAGNOSIS — Y999 Unspecified external cause status: Secondary | ICD-10-CM | POA: Insufficient documentation

## 2018-02-06 NOTE — ED Provider Notes (Signed)
[facility] EMERGENCY DEPARTMENT Provider Note   CSN: [id number] Arrival date & time: 02/06/18  0327     History   Chief Complaint Chief Complaint  Patient presents with  . Shoulder Injury    HPI Brett Allen is a 42 y.o. male.  The history is provided by the patient.  Shoulder Pain   This is a new problem. The current episode started less than 1 hour ago. The problem occurs constantly. The problem has been gradually improving. The pain is present in the right shoulder. The pain is moderate. Associated symptoms include limited range of motion.  patient with history of hypertension presents for shoulder pain.  He reports he has a history of a right shoulder dislocation that required reduction.  Since that time he will frequently feel that his shoulder "pops"out of place at times.  Tonight while sleeping he woke up in significant pain in the shoulder and he felt that it was dislocated.  He was able to reduce the shoulder and then he arrived for evaluation.  Past Medical History:  Diagnosis Date  . Hypertension   . Sarcoidosis    right eye    There are no active problems to display for this patient.   Past Surgical History:  Procedure Laterality Date  . WRIST FRACTURE SURGERY Left 2017        Home Medications    Prior to Admission medications   Medication Sig Start Date End Date Taking? Authorizing Provider  meclizine (ANTIVERT) 25 MG tablet Take 1 tablet (25 mg total) by mouth 3 (three) times daily as needed for dizziness. 10/07/16   Fayrene Helperran, Bowie, PA-C  methocarbamol (ROBAXIN) 500 MG tablet Take 1 tablet (500 mg total) by mouth 2 (two) times daily. 08/30/13   Rolland PorterJames, Mark, MD  naproxen (NAPROSYN) 500 MG tablet Take 1 tablet (500 mg total) by mouth 2 (two) times daily. 08/30/13   Rolland PorterJames, Mark, MD  tobramycin-dexamethasone [facility](TOBRADEX) ophthalmic solution Place 1 drop into the right eye every 4 (four) hours while awake. 08/30/13   Rolland PorterJames, Mark, MD  traMADol  (ULTRAM) 50 MG tablet Take 1 tablet (50 mg total) by mouth every 6 (six) hours as needed. 08/30/13   Rolland PorterJames, Mark, MD    Family History No family history on file.  Social History Social History   Tobacco Use  . Smoking status: Current Some Day Smoker  . Smokeless tobacco: Never Used  Substance Use Topics  . Alcohol use: Yes    Comment: occassionally   . Drug use: No     Allergies   Patient has no known allergies.   Review of Systems Review of Systems  Musculoskeletal: Positive for arthralgias.  Neurological: Negative for weakness.     Physical Exam Updated Vital Signs BP (!) 153/111   Pulse 67   Temp 97.8 F (36.6 C) (Oral)   Resp 20   Ht 1.803 m (5\' 11" )   Wt 96.2 kg   SpO2 100%   BMI 29.57 kg/m   Physical Exam CONSTITUTIONAL: Well developed/well nourished HEAD: Normocephalic/atraumatic EYES: EOMI ENMT: Mucous membranes moist NECK: supple no meningeal signs SPINE/BACK:entire spine nontender CV: S1/S2 noted, no murmurs/rubs/gallops noted LUNGS: Lungs are clear to auscultation bilaterally, no apparent distress ABDOMEN: soft, nontender NEURO: Pt is awake/alert/appropriate, moves all extremitiesx4.  No facial droop.   EXTREMITIES: pulses normal/equal, full ROM, tenderness to right shoulder/right clavicle, no deformities, patient is able to fully range the right shoulder but does have pain with limitation of movement above  his head SKIN: warm, color normal PSYCH: no abnormalities of mood noted, alert and oriented to situation   ED Treatments / Results  Labs (all labs ordered are listed, but only abnormal results are displayed) Labs Reviewed - No data to display  EKG None  Radiology Dg Shoulder Right  Result Date: 02/06/2018 CLINICAL DATA:  Patient reports right shoulder dislocation while sleeping. EXAM: RIGHT SHOULDER - 2+ VIEW COMPARISON:  None. FINDINGS: Normal glenohumeral alignment, no dislocation. Probable Hill-Sachs impaction injury to the lateral  humeral head. No other fracture. There is no evidence of arthropathy or other focal bone abnormality. Soft tissues are unremarkable. IMPRESSION: 1. No dislocation. 2. Probable Hill-Sachs impaction injury to the lateral humeral head suggests sequela of prior dislocation. Electronically Signed   By: Rubye OaksMelanie  Ehinger M.D.   On: 02/06/2018 04:18    Procedures Procedures  SPLINT APPLICATION Date/Time: 4:21 AM Authorized by: Joya Gaskinsonald W Deziah Renwick Consent: Verbal consent obtained. Risks and benefits: risks, benefits and alternatives were discussed Consent given by: patient Splint applied by: orthopedic technician Location details: right shoulder/upper extremity Splint type: sling immobilizer Supplies used: sling Post-procedure: The splinted body part was neurovascularly unchanged following the procedure. Patient tolerance: Patient tolerated the procedure well with no immediate complications.    Medications Ordered in ED Medications - No data to display   Initial Impression / Assessment and Plan / ED Course  I have reviewed the triage vital signs and the nursing notes.  Pertinent imaging results that were available during my care of the patient were reviewed by me and considered in my medical decision making (see chart for details).     Pt presents with right shoulder pain, suspicious for dislocation at home but he has since reduced.  He has had multiple episodes previously.  I feel he would be appropriate for outpatient management and referral to sports medicine.  He will be given a sling immobilizer and we discussed limitation in heavy lifting, limitation in working out, limitation in overhead work. 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  Follow-up is warranted soon. Final Clinical Impressions(s) / ED Diagnoses   Final diagnoses:  Strain of right shoulder, initial encounter    ED Discharge Orders    None       Zadie RhineWickline, Nita Whitmire,  MD 02/06/18 [phone]

## 2018-02-06 NOTE — ED Notes (Signed)
Pt understood dc material and follow up information. NAD noted. Work excuse given at [employer]

## 2018-02-06 NOTE — ED Triage Notes (Signed)
Pt states that his right shoulder "popped out" while he was sleeping. States he has had this happen before. Once they had to put him to sleep to reduce it. Pt states it is in place but feels loose. Pt has movement of arm in triage

## 2018-02-07 ENCOUNTER — Encounter: Payer: Self-pay | Admitting: Family Medicine

## 2018-02-07 ENCOUNTER — Ambulatory Visit (INDEPENDENT_AMBULATORY_CARE_PROVIDER_SITE_OTHER): Payer: Managed Care, Other (non HMO) | Admitting: Family Medicine

## 2018-02-07 VITALS — BP 155/104 | HR 80 | Ht 71.0 in | Wt 212.0 lb

## 2018-02-07 DIAGNOSIS — M25311 Other instability, right shoulder: Secondary | ICD-10-CM

## 2018-02-07 DIAGNOSIS — M25511 Pain in right shoulder: Secondary | ICD-10-CM | POA: Diagnosis not present

## 2018-02-07 NOTE — Progress Notes (Signed)
PCP: Clide DalesWright, Morgan Dionne, PA  Subjective:   HPI: Patient is a 42 y.o. male here for right shoulder pain.  Patient states that on 03 February 2018 he was playing basketball and felt as though his shoulder dislocated which he states he self reduced.  He went to the emergency department and x-rays were performed at that time.  He was discharged with a sling for immobilization.  He then reports that on 06 February 2018 while sleeping he felt as though his right shoulder dislocated once again.  Currently reports 4/10 pain which is worse with movement.  He localizes pain anteriorly.  He denies any numbness or tingling in the hand.  Reports he has fairly good range of motion with some discomfort when reaching overhead.  He also reports feeling of stiffness in his shoulder.  He acknowledges that back in 2010 he had an initial injury and dislocation to his right shoulder    Past Medical History:  Diagnosis Date  . Hypertension   . Sarcoidosis    right eye    No current outpatient medications on file prior to visit.   No current facility-administered medications on file prior to visit.     Past Surgical History:  Procedure Laterality Date  . WRIST FRACTURE SURGERY Left 2017    No Known Allergies  Social History   Socioeconomic History  . Marital status: Married    Spouse name: Not on file  . Number of children: Not on file  . Years of education: Not on file  . Highest education level: Not on file  Occupational History  . Not on file  Social Needs  . Financial resource strain: Not on file  . Food insecurity:    Worry: Not on file    Inability: Not on file  . Transportation needs:    Medical: Not on file    Non-medical: Not on file  Tobacco Use  . Smoking status: Current Some Day Smoker  . Smokeless tobacco: Never Used  Substance and Sexual Activity  . Alcohol use: Yes    Comment: occassionally   . Drug use: No  . Sexual activity: Not on file  Lifestyle  . Physical activity:     Days per week: Not on file    Minutes per session: Not on file  . Stress: Not on file  Relationships  . Social connections:    Talks on phone: Not on file    Gets together: Not on file    Attends religious service: Not on file    Active member of club or organization: Not on file    Attends meetings of clubs or organizations: Not on file    Relationship status: Not on file  . Intimate partner violence:    Fear of current or ex partner: Not on file    Emotionally abused: Not on file    Physically abused: Not on file    Forced sexual activity: Not on file  Other Topics Concern  . Not on file  Social History Narrative  . Not on file    No family history on file.  BP (!) 155/104   Pulse 80   Ht 5\' 11"  (1.803 m)   Wt 212 lb (96.2 kg)   BMI 29.57 kg/m   Review of Systems: See HPI above.     Objective:  Physical Exam:  Gen: awake, alert, NAD, comfortable in exam room Pulm: breathing unlabored  Right shoulder: No obvious deformity or asymmetry. No bruising. No swelling  Mild tenderness of the anterior shoulder.  No tenderness over the [facility] joint Only slightly reduced shoulder flexion and abduction.  Normal IR/ER NV intact distally.  No decreased sensation over the deltoid Special Tests:  - Impingement: General discomfort with Hawkins and Neer's.  - Supraspinatous: Pain with empty can.  4+/5 strength due to pain - Infraspinatous/Teres: Negative external rotation lag. Strength normal/symmetric with external rotation - Subscapularis: Pain with belly press, pain with bear hug. Strength normal/symmetric with internal rotation - Labrum: Equivocal Obriens.   Left shoulder: No obvious deformity or asymmetry. No bruising. No swelling No TTP Full ROM in flexion, abduction, internal/external rotation NV intact distally Special Tests:   - Supraspinatous: Negative empty can.  5/5 strength - Infraspinatous/Teres: 5/5 strength with external rotation - Subscapularis: 5/5 strength  with internal rotation    Assessment & Plan:  1.  Right shoulder pain and instability.  Based on patient's recent injury it sounds more consistent with subluxation as opposed to complete dislocation.  X-rays for emergency department were independently reviewed today.  No acute bony abnormalities or bony Bankart lesion seen.  On review the possible Hill-Sachs lesion is not apparent.  He is neurovascularly intact on exam.  He has good rotator cuff strength with reported pain.  Unlikely he has a rotator cuff tear.  Given his recurrence of shoulder subluxation underlying labral pathology is likely.  For only 4 days after initial injury he has good range of motion and strength, therefore will refer for physical therapy.  He will continue to wear his sling for the next 10 days following which he will initiate physical therapy.  Ice for 15 minutes 3-4 times per day.  Aleve 2 tablets twice daily or ibuprofen 800 mg 3 times daily as needed for pain taken with food.  He was provided a work note for the next 10 days while wearing sling.  He will follow-up in clinic in 10 days.  If despite physical therapy he continues to have pain or feelings of instability consider further evaluation with MR arthrogram.

## 2018-02-07 NOTE — Patient Instructions (Signed)
You dislocated/subluxed your shoulder. Wear sling regularly for next 10 days and follow up with me at that time. Start physical therapy in 1 week (don't start sooner than this). You can do simple motion exercises (arm circles, swings below shoulder level) twice a day. Aleve 2 tabs twice a day with food OR ibuprofen 600mg  three times a day with food for pain and inflammation for 7-10 days then as needed. See work note for details. Icing 15 minutes at a time 3-4 times a day.

## 2018-02-08 ENCOUNTER — Encounter: Payer: Self-pay | Admitting: Family Medicine

## 2018-02-13 ENCOUNTER — Encounter: Payer: Self-pay | Admitting: Physical Therapy

## 2018-02-13 ENCOUNTER — Other Ambulatory Visit: Payer: Self-pay

## 2018-02-13 ENCOUNTER — Ambulatory Visit: Payer: Managed Care, Other (non HMO) | Attending: Family Medicine | Admitting: Physical Therapy

## 2018-02-13 DIAGNOSIS — M25611 Stiffness of right shoulder, not elsewhere classified: Secondary | ICD-10-CM | POA: Diagnosis present

## 2018-02-13 DIAGNOSIS — R293 Abnormal posture: Secondary | ICD-10-CM | POA: Diagnosis present

## 2018-02-13 DIAGNOSIS — M25511 Pain in right shoulder: Secondary | ICD-10-CM | POA: Insufficient documentation

## 2018-02-13 NOTE — Therapy (Addendum)
[facility] [address]  [place], [place], [address] Phone: [phone]   Fax:  [phone]  Physical Therapy Evaluation  Patient Details  Name: Brett Allen MRN: [id number] Date of Birth: [birth year] Referring Provider: Karlton Lemon, MD   Encounter Date: 02/13/2018  PT End of Session - 02/13/18 0840    Visit Number  1    Number of Visits  8    Date for PT Re-Evaluation  03/15/18    Authorization Type  [employer]    PT Start Time  0840    PT Stop Time  0925    PT Time Calculation (min)  45 min    Activity Tolerance  Patient tolerated treatment well    Behavior During Therapy  [facility] for tasks assessed/performed       Past Medical History:  Diagnosis Date  . Hypertension   . Sarcoidosis    right eye    Past Surgical History:  Procedure Laterality Date  . WRIST FRACTURE SURGERY Left 2017    There were no vitals filed for this visit.   Subjective Assessment - 02/13/18 0840    Subjective  Pt reports first incidence of R shoulder subluxation/dislocation was ~6-7 yrs ago. Was placed in sling and referred to PT at the time, but didn't use the sling for long and did not do the therapy. Has had 3 incidences recently - 2 while playing basketball & the 3rd time when rolling over in bed. States subluxation typically occurs posteriorly.    Limitations  House hold activities    Patient Stated Goals  "to get back to working out"    Currently in Pain?  No/denies         [facility] PT Assessment - 02/13/18 0840      Assessment   Medical Diagnosis  R shoulder dislocation/subluxation    Referring Provider  Karlton Lemon, MD    Onset Date/Surgical Date  02/03/18    Hand Dominance  Right    Next MD Visit  03/18/18    Prior Therapy  PT for back ~1.5 yrs ago      Precautions   Required Braces or Orthoses  Sling   x 7-10 days     Balance Screen   Has the patient fallen in the past 6 months  No    Has the patient had  a decrease in activity level because of a fear of falling?   No    Is the patient reluctant to leave their home because of a fear of falling?   No      Home Film/video editor residence      Prior Function   Level of Independence  Independent    Vocation  Full time employment   currently out of work due to injury - not eligible for [employer]    Leisure  work out - 4 days/wk lifting, 2 days/wk cardio; play basketball; playing video games      Observation/Other Assessments   Focus on Therapeutic Outcomes (FOTO)   Shoulder - 70% (30% limitation); predicted 81% (19% limitation)      Posture/Postural Control   Posture/Postural Control  Postural limitations    Postural Limitations  Rounded Shoulders   slight     ROM / Strength   AROM / PROM / Strength  AROM;Strength      AROM   Overall AROM Comments  discomfort/tightness reported  at end ROM in R shoulder    AROM Assessment Site  Shoulder    Right/Left Shoulder  Right;Left    Right Shoulder Extension  34 Degrees    Right Shoulder Flexion  142 Degrees    Right Shoulder ABduction  152 Degrees    Right Shoulder Internal Rotation  66 Degrees    Right Shoulder External Rotation  80 Degrees    Left Shoulder Extension  44 Degrees    Left Shoulder Flexion  150 Degrees    Left Shoulder ABduction  154 Degrees    Left Shoulder Internal Rotation  80 Degrees    Left Shoulder External Rotation  84 Degrees      Strength   Strength Assessment Site  Shoulder    Right/Left Shoulder  Right;Left    Right Shoulder Flexion  4+/5    Right Shoulder ABduction  4+/5    Right Shoulder Internal Rotation  4+/5    Right Shoulder External Rotation  4+/5    Left Shoulder Flexion  5/5    Left Shoulder ABduction  5/5    Left Shoulder Internal Rotation  5/5    Left Shoulder External Rotation  5/5      Palpation   Palpation comment  increased muscle tension in pecs & posterior shoulder complex  with slight ttp over anterior shoulder at [facility] joint                Objective measurements completed on examination: See above findings.      [facility] Adult PT Treatment/Exercise - 02/13/18 0840      Exercises   Exercises  Shoulder      Shoulder Exercises: Standing   Row  Both;10 reps;Theraband;Strengthening   5" hold   Row Limitations  emphasis on scapular retraction while minimizing shoulder extension      Shoulder Exercises: ROM/Strengthening   Wall Pushups  10 reps   5" hold   Wall Pushups Limitations  serratus push-up      Shoulder Exercises: Isometric Strengthening   Flexion Limitations  10 x 5"    Extension Limitations  10 x 5"    External Rotation Limitations  10 x 5"    Internal Rotation Limitations  10 x 5"    ABduction Limitations  10 x 5"             PT Education - 02/13/18 0923    Education Details  PT eval findings, anticipated POC & initial HEP    Person(s) Educated  Patient    Methods  Explanation;Demonstration;Handout    Comprehension  Verbalized understanding;Returned demonstration          PT Long Term Goals - 02/13/18 0946      PT LONG TERM GOAL #1   Title  Independent with ongoing/advanced HEP    Status  New    Target Date  03/15/18      PT LONG TERM GOAL #2   Title  R shoulder AROM equivalent to L with restriction due to pain or tightness    Status  New    Target Date  03/15/18      PT LONG TERM GOAL #3   Title  R shoulder strength 5/5 w/o limitaiton due to pain or guarding    Status  New    Target Date  03/15/18      PT LONG TERM GOAL #4   Title  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 Patient reports pain has been minimal to nonexistent recently with no pain on eval. Patient presents today with slight reduced AROM R shoulder noting more restriction due to tightness than pain. Mild increased muscle tension noted in R pecs, biceps and posterior shoulder complex but only ttp in anterior shoulder over AC joint. Very mild weakness present in R shoulder vs L with greatest difference noted in IR/ER. Education today on initial HEP for isometric strengthening and scapular stabilization. 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    Clinical Presentation  Stable    Clinical Decision Making  Low    Rehab Potential  Excellent    PT Frequency  2x / week    PT Duration  4 weeks    PT Treatment/Interventions  Patient/family education;Neuromuscular re-education;Therapeutic exercise;Therapeutic activities;ADLs/Self Care Home Management;Manual techniques;Passive range of motion;Dry needling;Taping;Electrical Stimulation;Moist Heat;Cryotherapy;Vasopneumatic Device;Ultrasound;Iontophoresis 31m/ml Dexamethasone    Consulted and Agree with Plan of Care  Patient       Patient will benefit from skilled therapeutic intervention in order to improve the following deficits and impairments:  Decreased range of motion, Increased muscle spasms, Impaired flexibility, Decreased strength, Postural dysfunction, Improper body mechanics, Impaired UE functional use  Visit Diagnosis: Stiffness of right shoulder, not elsewhere classified  Acute pain of right shoulder  Abnormal posture     Problem List Patient Active Problem List   Diagnosis Date Noted  . 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 Chronic bilateral low back pain without sciatica 07/23/2015  . Hyperlipidemia 07/09/2015    JPercival Spanish PT, MPT 02/13/2018, 9:53 AM  [facility] [place] [address]Phone: [phone]  Fax:  [phone] Name: Brett STAMBAUGHMRN: [id number] of Birth: [birth year]  PHYSICAL THERAPY DISCHARGE SUMMARY  Visits from Start of Care: 1  Current functional level related to goals / functional outcomes:   Refer to above evaluation as pt cancelled first treatment visit and no showed for next 3 visits, at which point all remaining visits were cancelled due to Cx/NS policy. New referral received 04/04/18 - pt scheduled for new eval later today.   Remaining deficits:   Unable to assess   Education / Equipment:   Initial HEP  Plan: Patient agrees to discharge.  Patient goals were not met. Patient is being discharged due to not returning since the last visit.  ?????     JPercival Spanish PT, MPT 04/23/18, 9:47 AM  [facility] [address]Phone: [phone]  Fax:  [phone]

## 2018-02-15 ENCOUNTER — Ambulatory Visit (INDEPENDENT_AMBULATORY_CARE_PROVIDER_SITE_OTHER): Payer: Managed Care, Other (non HMO) | Admitting: Family Medicine

## 2018-02-15 ENCOUNTER — Encounter: Payer: Self-pay | Admitting: Family Medicine

## 2018-02-15 VITALS — BP 155/101 | HR 73 | Ht 71.0 in | Wt 212.0 lb

## 2018-02-15 DIAGNOSIS — M25311 Other instability, right shoulder: Secondary | ICD-10-CM

## 2018-02-15 NOTE — Patient Instructions (Signed)
Continue physical therapy and do home exercises on days you don't go to therapy. Avoid military press, lat pull downs.  Other exercises should be ok including bench, incline bench, etc. But try them out at about 50% of normal weight you would use. Stop the sling. Tylenol, ibuprofen, icing only if needed. Follow up with me in 4 weeks.

## 2018-02-17 ENCOUNTER — Encounter: Payer: Self-pay | Admitting: Family Medicine

## 2018-02-17 NOTE — Progress Notes (Signed)
PCP: Clide Dales, PA  Subjective:   HPI:  8/22: Patient is a 42 y.o. male here for right shoulder pain.  Patient states that on 03 February 2018 he was playing basketball and felt as though his shoulder dislocated which he states he self reduced.  He went to the emergency department and x-rays were performed at that time.  He was discharged with a sling for immobilization.  He then reports that on 06 February 2018 while sleeping he felt as though his right shoulder dislocated once again.  Currently reports 4/10 pain which is worse with movement.  He localizes pain anteriorly.  He denies any numbness or tingling in the hand.  Reports he has fairly good range of motion with some discomfort when reaching overhead.  He also reports feeling of stiffness in his shoulder.  He acknowledges that back in 2010 he had an initial injury and dislocation to his right shoulder  8/30: Patient reports he feels about 80% improved. Wearing sling but able to move fully out of this. Pain level 0/10. Started physical therapy and doing home exercises. No new injuries. No numbness.  Past Medical History:  Diagnosis Date  . Hypertension   . Sarcoidosis    right eye    No current outpatient medications on file prior to visit.   No current facility-administered medications on file prior to visit.     Past Surgical History:  Procedure Laterality Date  . WRIST FRACTURE SURGERY Left 2017    No Known Allergies  Social History   Socioeconomic History  . Marital status: Married    Spouse name: Not on file  . Number of children: Not on file  . Years of education: Not on file  . Highest education level: Not on file  Occupational History  . Not on file  Social Needs  . Financial resource strain: Not on file  . Food insecurity:    Worry: Not on file    Inability: Not on file  . Transportation needs:    Medical: Not on file    Non-medical: Not on file  Tobacco Use  . Smoking status: Current  Some Day Smoker  . Smokeless tobacco: Never Used  Substance and Sexual Activity  . Alcohol use: Yes    Comment: occassionally   . Drug use: No  . Sexual activity: Not on file  Lifestyle  . Physical activity:    Days per week: Not on file    Minutes per session: Not on file  . Stress: Not on file  Relationships  . Social connections:    Talks on phone: Not on file    Gets together: Not on file    Attends religious service: Not on file    Active member of club or organization: Not on file    Attends meetings of clubs or organizations: Not on file    Relationship status: Not on file  . Intimate partner violence:    Fear of current or ex partner: Not on file    Emotionally abused: Not on file    Physically abused: Not on file    Forced sexual activity: Not on file  Other Topics Concern  . Not on file  Social History Narrative  . Not on file    History reviewed. No pertinent family history.  BP (!) 155/101   Pulse 73   Ht 5\' 11"  (1.803 m)   Wt 212 lb (96.2 kg)   BMI 29.57 kg/m   Review of Systems:  See HPI above.     Objective:  Physical Exam:  Gen: NAD, comfortable in exam room  Right shoulder: No swelling, ecchymoses.  No gross deformity. No TTP. FROM. Negative Hawkins, Neers. Negative Yergasons. Strength 5/5 with empty can and resisted internal/external rotation. Negative apprehension, minimal discomfort.  Mild discomfort empty can NV intact distally. Equivocal o'briens.   Assessment & Plan:  1.  Right shoulder pain and instability.  Recent subluxations of shoulder. Radiographs reassuring. Clinically improving.  Stop sling at this time.  Continue physical therapy and home exercises.  Tylenol, ibuprofen, icing only if needed.  Discussed activities to avoid and how to progress in the gym.  F/u in 4 weeks.  Return to work full duty.

## 2018-02-19 ENCOUNTER — Ambulatory Visit: Payer: Managed Care, Other (non HMO)

## 2018-02-21 ENCOUNTER — Encounter

## 2018-02-25 ENCOUNTER — Ambulatory Visit: Payer: Managed Care, Other (non HMO) | Attending: Family Medicine | Admitting: Physical Therapy

## 2018-02-28 ENCOUNTER — Ambulatory Visit: Payer: Managed Care, Other (non HMO)

## 2018-03-04 ENCOUNTER — Ambulatory Visit: Payer: Managed Care, Other (non HMO) | Admitting: Physical Therapy

## 2018-03-11 ENCOUNTER — Encounter: Payer: Managed Care, Other (non HMO) | Admitting: Physical Therapy

## 2018-03-14 ENCOUNTER — Ambulatory Visit: Payer: Managed Care, Other (non HMO) | Admitting: Family Medicine

## 2018-03-18 ENCOUNTER — Ambulatory Visit: Payer: Managed Care, Other (non HMO) | Admitting: Family Medicine

## 2018-03-21 ENCOUNTER — Encounter: Payer: Managed Care, Other (non HMO) | Admitting: Physical Therapy

## 2018-04-04 ENCOUNTER — Ambulatory Visit (INDEPENDENT_AMBULATORY_CARE_PROVIDER_SITE_OTHER): Payer: Managed Care, Other (non HMO) | Admitting: Family Medicine

## 2018-04-04 ENCOUNTER — Encounter: Payer: Self-pay | Admitting: Family Medicine

## 2018-04-04 VITALS — BP 145/107 | HR 86 | Ht 71.0 in | Wt 215.0 lb

## 2018-04-04 DIAGNOSIS — M25311 Other instability, right shoulder: Secondary | ICD-10-CM

## 2018-04-04 NOTE — Patient Instructions (Signed)
Start physical therapy and do home exercises on days you don't go to therapy. Avoid military press, lat pull downs still.   Tylenol, ibuprofen, icing only if needed. Follow up with me in 6 weeks.

## 2018-04-04 NOTE — Progress Notes (Signed)
PCP: Clide Dales, PA  Subjective:   HPI:  8/22: Patient is a 42 y.o. male here for right shoulder pain.  Patient states that on 03 February 2018 he was playing basketball and felt as though his shoulder dislocated which he states he self reduced.  He went to the emergency department and x-rays were performed at that time.  He was discharged with a sling for immobilization.  He then reports that on 06 February 2018 while sleeping he felt as though his right shoulder dislocated once again.  Currently reports 4/10 pain which is worse with movement.  He localizes pain anteriorly.  He denies any numbness or tingling in the hand.  Reports he has fairly good range of motion with some discomfort when reaching overhead.  He also reports feeling of stiffness in his shoulder.  He acknowledges that back in 2010 he had an initial injury and dislocation to his right shoulder  8/30: Patient reports he feels about 80% improved. Wearing sling but able to move fully out of this. Pain level 0/10. Started physical therapy and doing home exercises. No new injuries. No numbness.  10/17: Patient reports he has been going to the gym 4 times a week but avoiding overhead motions. He reports on Tuesday felt like his shoulder slipped out again when he was getting out of bed then popped back into place. Pain level 0/10, can get a mild soreness anteriorly since then. Did only 1 visit of PT and has not been doing rehab exercises though. No skin changes, numbness.  Past Medical History:  Diagnosis Date  . Hypertension   . Sarcoidosis    right eye    Current Outpatient Medications on File Prior to Visit  Medication Sig Dispense Refill  . penicillin v potassium (VEETID) 500 MG tablet TK 1 T PO QID UNTIL GONE  0   No current facility-administered medications on file prior to visit.     Past Surgical History:  Procedure Laterality Date  . WRIST FRACTURE SURGERY Left 2017    No Known Allergies  Social  History   Socioeconomic History  . Marital status: Married    Spouse name: Not on file  . Number of children: Not on file  . Years of education: Not on file  . Highest education level: Not on file  Occupational History  . Not on file  Social Needs  . Financial resource strain: Not on file  . Food insecurity:    Worry: Not on file    Inability: Not on file  . Transportation needs:    Medical: Not on file    Non-medical: Not on file  Tobacco Use  . Smoking status: Current Some Day Smoker  . Smokeless tobacco: Never Used  Substance and Sexual Activity  . Alcohol use: Yes    Comment: occassionally   . Drug use: No  . Sexual activity: Not on file  Lifestyle  . Physical activity:    Days per week: Not on file    Minutes per session: Not on file  . Stress: Not on file  Relationships  . Social connections:    Talks on phone: Not on file    Gets together: Not on file    Attends religious service: Not on file    Active member of club or organization: Not on file    Attends meetings of clubs or organizations: Not on file    Relationship status: Not on file  . Intimate partner violence:  Fear of current or ex partner: Not on file    Emotionally abused: Not on file    Physically abused: Not on file    Forced sexual activity: Not on file  Other Topics Concern  . Not on file  Social History Narrative  . Not on file    History reviewed. No pertinent family history.  BP (!) 145/107   Pulse 86   Ht 5\' 11"  (1.803 m)   Wt 215 lb (97.5 kg)   BMI 29.99 kg/m   Review of Systems: See HPI above.     Objective:  Physical Exam:  Gen: NAD, comfortable in exam room  Right shoulder: No swelling, ecchymoses.  No gross deformity. No TTP. FROM. Negative Hawkins, Neers. Negative Yergasons. Strength 5/5 with empty can and resisted internal/external rotation. Minimal pain with apprehension. NV intact distally. Negative o'briens.   Assessment & Plan:  1.  Right shoulder  pain and instability - most recent subluxation on Tuesday.  Exam is reassuring.  He did not follow through with PT and HEP - he is going to do so for next 6 weeks and follow-up.  If he continues to have instability despite therapy will go ahead with MR arthrogram and surgical referral.  Tylenol, ibuprofen only if needed.

## 2018-04-23 ENCOUNTER — Ambulatory Visit: Payer: Managed Care, Other (non HMO) | Admitting: Physical Therapy

## 2018-04-25 ENCOUNTER — Other Ambulatory Visit: Payer: Self-pay

## 2018-04-25 ENCOUNTER — Ambulatory Visit: Payer: Managed Care, Other (non HMO) | Attending: Family Medicine | Admitting: Physical Therapy

## 2018-04-25 DIAGNOSIS — M25611 Stiffness of right shoulder, not elsewhere classified: Secondary | ICD-10-CM | POA: Insufficient documentation

## 2018-04-25 DIAGNOSIS — M25511 Pain in right shoulder: Secondary | ICD-10-CM | POA: Insufficient documentation

## 2018-04-25 DIAGNOSIS — R293 Abnormal posture: Secondary | ICD-10-CM | POA: Insufficient documentation

## 2018-04-25 NOTE — Therapy (Signed)
[facility] [address]  [address] [place], [place], [address] Phone: [phone]   Fax:  [phone]  Physical Therapy Evaluation  Patient Details  Name: Brett Allen MRN: [id number] Date of Birth: 1976/04/29 Referring Provider (PT): Norton Blizzard, MD   Encounter Date: 04/25/2018  PT End of Session - 04/25/18 1536    Visit Number  1    Number of Visits  8    Date for PT Re-Evaluation  05/23/18    Authorization Type  [employer]    Authorization - Number of Visits  19    PT Start Time  1536    PT Stop Time  1621    PT Time Calculation (min)  45 min    Activity Tolerance  Patient tolerated treatment well    Behavior During Therapy  [facility] for tasks assessed/performed       Past Medical History:  Diagnosis Date  . Hypertension   . Sarcoidosis    right eye    Past Surgical History:  Procedure Laterality Date  . WRIST FRACTURE SURGERY Left 2017    There were no vitals filed for this visit.   Subjective Assessment - 04/25/18 1539    Subjective  On prior eval on 02/13/18, pt reported first incidence of R shoulder subluxation/dislocation was ~6-7 yrs ago. Was placed in sling and referred to PT at the time but didn't use the sling for long and did not do the therapy. At the time of the 02/13/18 eval, he had had 3 recent incidences of subluxation - 2 while playing basketball & the 3rd time when rolling over in bed and has stated subluxation typically occurs posteriorly.  Reports he did not return to PT after that eval due to issues with depression but states he tried to keep up with the initial HEP. He now has had 3 more instances of subluxation since prior eval - all occurring when trying to get up from lying prone which is his preferred sleeping position.    Limitations  House hold activities    Patient Stated Goals  "to get back to playing basketball"    Currently in Pain?  No/denies    Pain Score  0-No pain          [facility] PT Assessment - 04/25/18 1536      Assessment   Medical Diagnosis  R shoulder instability    Referring Provider (PT)  Norton Blizzard, MD    Onset Date/Surgical Date  02/03/18    Hand Dominance  Right    Next MD Visit  05/14/18    Prior Therapy  PT for back ~1.5 yrs ago      Balance Screen   Has the patient fallen in the past 6 months  No    Has the patient had a decrease in activity level because of a fear of falling?   No    Is the patient reluctant to leave their home because of a fear of falling?   No      Home Public house manager residence      Prior Function   Level of Independence  Independent    Vocation  Full time employment    Psychologist, forensic    Leisure  work out - 4 days/wk lifting, 2 days/wk cardio; play basketball; playing video games      Observation/Other Assessments   Focus on Therapeutic Outcomes (FOTO)   Shoulder -  70% (30% limitation); predicted 75% (25% limitation)      Posture/Postural Control   Posture/Postural Control  Postural limitations    Postural Limitations  Rounded Shoulders   slight R>L     ROM / Strength   AROM / PROM / Strength  AROM;Strength      AROM   Overall AROM   Within functional limits for tasks performed      Strength   Strength Assessment Site  Shoulder    Right Shoulder Flexion  5/5    Right Shoulder ABduction  5/5    Right Shoulder Internal Rotation  5/5    Right Shoulder External Rotation  4+/5    Left Shoulder Flexion  5/5    Left Shoulder ABduction  5/5    Left Shoulder Internal Rotation  5/5    Left Shoulder External Rotation  5/5      Palpation   Palpation comment  increased muscle tension in lateral pecs & posterior shoulder complex, but denies ttp                Objective measurements completed on examination: See above findings.      [facility] Adult PT Treatment/Exercise - 04/25/18 1536      Exercises   Exercises  Shoulder      Shoulder  Exercises: Standing   Extension  Both;10 reps;Theraband;Strengthening   5" hold   Theraband Level (Shoulder Extension)  Level 3 (Green)    Extension Limitations  cues for scap retraction/depression with mini-shoulder extension only to neutral     Row  Both;10 reps;Theraband;Strengthening   5" hold   Theraband Level (Shoulder Row)  Level 3 (Green)    Row Limitations  emphasis on scapular retraction while minimizing shoulder extension      Shoulder Exercises: ROM/Strengthening   Lat Pull  10 reps    Lat Pull Limitations  10# - extension pull-down + scap retraction/depression    Cybex Row  10 reps    Cybex Row Limitations  25# - low row - cues for scap retraction with 5" hold             PT Education - 04/25/18 1620    Education Details  PT eval findings, anticipated POC, and review/update of prior HEP    Person(s) Educated  Patient    Methods  Explanation;Demonstration;Handout    Comprehension  Verbalized understanding;Returned demonstration;Need further instruction          PT Long Term Goals - 04/25/18 1621      PT LONG TERM GOAL #1   Title  Independent with ongoing/advanced HEP    Status  New    Target Date  05/23/18      PT LONG TERM GOAL #2   Title  R shoulder strength 5/5 w/o limitaiton due to pain or guarding    Status  New    Target Date  05/23/18      PT LONG TERM GOAL #3   Title  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 Pt initially evaluated on 02/13/18 for same issue following an acute subluxation but failed to return for any subsequent visits. Pt reports 3 additional instances of subluxation since prior eval, all of which occurred when trying to push up from prone lying in bed. Patient continues to report no pain and R shoulder AROM today now WNL with only very mild weakness in R shoulder ER on MMT. Mild increased muscle tension noted in R pecs, biceps and posterior shoulder complex persists but no ttp evident. Reviewed some of prior initial HEP, with pt requiring significant correction of technique, and addressed appropriate gym machine exercises at pt request as he reports he will be more likely to follow through with exercises at the gym - emphasized proper technique and cautioned pt to keep weights low on machines to ensure good form. 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    Clinical Presentation  Evolving    Clinical Presentation due to:  ongoing instances of shoulder subluxation (at least 6 since summer 2019)    Clinical Decision Making  Low    Rehab Potential  Excellent    PT Frequency  2x / week    PT Duration  4 weeks    PT Treatment/Interventions  Patient/family education;Neuromuscular re-education;Therapeutic exercise;Therapeutic activities;ADLs/Self Care Home Management;Manual techniques;Passive range of motion;Dry needling;Taping;Electrical Stimulation;Moist Heat;Cryotherapy;Vasopneumatic Device;Ultrasound;Iontophoresis 4mg /ml Dexamethasone    Consulted and Agree with Plan of Care  Patient       Patient will benefit from skilled therapeutic intervention in order to improve the  following deficits and impairments:  Increased muscle spasms, Impaired flexibility, Decreased strength, Postural dysfunction, Improper body mechanics, Impaired UE functional use, Hypermobility  Visit Diagnosis: Stiffness of right shoulder, not elsewhere classified  Abnormal posture  Acute pain of right shoulder     Problem List Patient Active Problem List   Diagnosis Date Noted  . 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 Chronic bilateral low back pain without sciatica 07/23/2015  . Hyperlipidemia 07/09/2015    Marry Guan, PT, MPT 04/25/2018, 6:30 PM  [facility] [address]  [address] [place], [place], [address] Phone: [phone]   Fax:  [phone]  Name: Brett Allen MRN: [id number] Date of Birth: [birth year]

## 2018-05-09 ENCOUNTER — Ambulatory Visit: Payer: Managed Care, Other (non HMO) | Attending: Family Medicine

## 2018-05-14 ENCOUNTER — Ambulatory Visit: Payer: Managed Care, Other (non HMO) | Admitting: Physical Therapy

## 2018-05-14 ENCOUNTER — Encounter: Payer: Self-pay | Admitting: Family Medicine

## 2018-05-14 ENCOUNTER — Encounter: Payer: Self-pay | Admitting: Physical Therapy

## 2018-05-14 ENCOUNTER — Ambulatory Visit (INDEPENDENT_AMBULATORY_CARE_PROVIDER_SITE_OTHER): Payer: Managed Care, Other (non HMO) | Admitting: Family Medicine

## 2018-05-14 VITALS — BP 154/88 | HR 68 | Ht 71.0 in | Wt 216.0 lb

## 2018-05-14 DIAGNOSIS — M25511 Pain in right shoulder: Secondary | ICD-10-CM

## 2018-05-14 DIAGNOSIS — M25611 Stiffness of right shoulder, not elsewhere classified: Secondary | ICD-10-CM

## 2018-05-14 DIAGNOSIS — R293 Abnormal posture: Secondary | ICD-10-CM

## 2018-05-14 NOTE — Therapy (Addendum)
[facility] [address]  [place], [place], [address] Phone: [phone]   Fax:  [phone]  Physical Therapy Treatment  Patient Details  Name: Brett Allen MRN: [id number] Date of Birth: 01-30-1976 Referring Provider (PT): Karlton Lemon, MD   Encounter Date: 05/14/2018  PT End of Session - 05/14/18 1551    Visit Number  2    Number of Visits  8    Date for PT Re-Evaluation  05/23/18    Authorization Type  [employer]    Authorization - Number of Visits  19    PT Start Time  1551    PT Stop Time  1636    PT Time Calculation (min)  45 min    Activity Tolerance  Patient tolerated treatment well    Behavior During Therapy  [facility] for tasks assessed/performed       Past Medical History:  Diagnosis Date  . Hypertension   . Sarcoidosis    right eye    Past Surgical History:  Procedure Laterality Date  . WRIST FRACTURE SURGERY Left 2017    There were no vitals filed for this visit.  Subjective Assessment - 05/14/18 1551    Subjective  Pt reporting completing HEP and gym exercises 3x/wk. States he has not had an epsiode of subluxation since working on the HEP.  Saw Dr. Barbaraann Barthel today and was released from his care with instructions to finish out PT until PT indicates readiness for discharge.    Limitations  House hold activities    Patient Stated Goals  "to get back to playing basketball"    Currently in Pain?  No/denies         [facility] PT Assessment - 05/14/18 1551      Assessment   Next MD Visit  PRN                   [facility] Adult PT Treatment/Exercise - 05/14/18 1551      Exercises   Exercises  Shoulder      Shoulder Exercises: Prone   Extension  Both;10 reps;Strengthening    Extension Limitations  I's over green Pball    External Rotation  Both;10 reps;Strengthening    External Rotation Limitations  WI's over green Pball    Horizontal ABduction 1  Both;10 reps;Strengthening    Horizontal ABduction 1 Limitations  T's over green Pball    Horizontal ABduction 2  Both;10 reps;Strengthening    Horizontal ABduction 2 Limitations  Y's over green Pball    Other Prone Exercises  UE walk-out on green Pball x10      Shoulder Exercises: Standing   External Rotation  Right;15 reps;Theraband;Strengthening    Theraband Level (Shoulder External Rotation)  Level 3 (Green)    External Rotation Limitations  neutral shoulder with towel under elbow    Internal Rotation  Right;15 reps;Theraband;Strengthening    Theraband Level (Shoulder Internal Rotation)  Level 3 (Green)    Internal Rotation Limitations  neutral shoulder with towel under elbow    Extension  Both;15 reps;Theraband;Strengthening   5" hold   Theraband Level (Shoulder Extension)  Level 4 (Blue)    Extension Limitations  cues for scap retraction/depression with mini-shoulder extension only to neutral     Row  Both;15 reps;Theraband;Strengthening   5" hold   Theraband Level (Shoulder Row)  Level 4 (Blue)    Row Limitations  emphasis on scapular retraction while minimizing shoulder extension & avoid posterior rotation  Other Standing Exercises  B wall clocks with red TB at wrists x10      Shoulder Exercises: ROM/Strengthening   UBE (Upper Arm Bike)  L3.0 x 6 min (3' fwd/3' back)    Wall Pushups  15 reps    Wall Pushups Limitations  push-up plus on orange Pball    "W" Arms  W row with green TB 15 x 3"    Other ROM/Strengthening Exercises  Serratus roll-up on orange Pball with red TB at forearms x 15             PT Education - 05/14/18 1637    Education Details  HEP update    Person(s) Educated  Patient    Methods  Explanation;Demonstration;Handout    Comprehension  Verbalized understanding;Returned demonstration          PT Long Term Goals - 05/14/18 1555      PT LONG TERM GOAL #1   Title  Independent with ongoing/advanced HEP    Status  On-going      PT LONG TERM GOAL #2   Title  R shoulder  strength 5/5 w/o limitaiton due to pain or guarding    Status  On-going      PT LONG TERM GOAL #3   Title  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 No recent instances of subluxation. Progressed scapular and shoulder stabilization exercises with some fatigue and exercise related muscle soreness noted but no pain reported. HEP updated to advance existing rows to blue TB and adding W rows with green TB and scapular wall clocks with red TB. Will plan to complete remaining scheduled visits, then anticipate transtion to HEP.    Rehab Potential  Excellent    PT Treatment/Interventions  Patient/family education;Neuromuscular re-education;Therapeutic exercise;Therapeutic activities;ADLs/Self Care Home Management;Manual techniques;Passive range of motion;Dry needling;Taping;Electrical Stimulation;Moist Heat;Cryotherapy;Vasopneumatic Device;Ultrasound;Iontophoresis 72m/ml Dexamethasone    PT Next Visit Plan  Progress scapular and shoulder stabilization and strengthening as tolerated    Consulted and Agree with Plan of Care  Patient       Patient will benefit from skilled therapeutic intervention in order to improve the following deficits and impairments:  Increased muscle spasms, Impaired flexibility, Decreased strength, Postural dysfunction, Improper body mechanics, Impaired UE functional use, Hypermobility  Visit Diagnosis: Stiffness of right shoulder, not elsewhere classified  Abnormal  posture  Acute pain of right shoulder     Problem List Patient Active Problem List   Diagnosis Date Noted  . 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 Chronic bilateral low back pain without sciatica 07/23/2015  . Hyperlipidemia 07/09/2015    JPercival Spanish PT, MPT 05/14/2018, 4:44 PM  [facility] [place] [address]: [phone]  Fax:  [phone] Name:  Brett Allen MRN: [id number] Date of Birth: 1976/05/27  PHYSICAL THERAPY DISCHARGE SUMMARY  Visits from Start of Care: 2  Current functional level related to goals / functional outcomes:   Refer to above note for status as of only treatment visit since eval. Pt being discharged from PT per Cx/NS policy due to 3 NS.   Remaining deficits:   Unable to assess as above.   Education / Equipment:   HEP  Plan: Patient agrees to discharge.  Patient goals were not met. Patient is being discharged due to not returning since the last visit.  ?????     Percival Spanish, PT, MPT 05/23/18, 4:05 PM  [facility] [address]  [place], [place], [address] Phone: [phone]   Fax:  [phone]

## 2018-05-14 NOTE — Progress Notes (Signed)
PCP: Clide DalesWright, Morgan Dionne, PA  Subjective:   HPI:  8/22: Patient is a 42 y.o. male here for right shoulder pain.  Patient states that on 03 February 2018 he was playing basketball and felt as though his shoulder dislocated which he states he self reduced.  He went to the emergency department and x-rays were performed at that time.  He was discharged with a sling for immobilization.  He then reports that on 06 February 2018 while sleeping he felt as though his right shoulder dislocated once again.  Currently reports 4/10 pain which is worse with movement.  He localizes pain anteriorly.  He denies any numbness or tingling in the hand.  Reports he has fairly good range of motion with some discomfort when reaching overhead.  He also reports feeling of stiffness in his shoulder.  He acknowledges that back in 2010 he had an initial injury and dislocation to his right shoulder  8/30: Patient reports he feels about 80% improved. Wearing sling but able to move fully out of this. Pain level 0/10. Started physical therapy and doing home exercises. No new injuries. No numbness.  10/17: Patient reports he has been going to the gym 4 times a week but avoiding overhead motions. He reports on Tuesday felt like his shoulder slipped out again when he was getting out of bed then popped back into place. Pain level 0/10, can get a mild soreness anteriorly since then. Did only 1 visit of PT and has not been doing rehab exercises though. No skin changes, numbness.  11/26: Patient reports he's doing well. No pain, feeling of instability. He states he's doing home exercises regularly. Able to work out at gym without problems. No skin changes, numbness.  Past Medical History:  Diagnosis Date  . Hypertension   . Sarcoidosis    right eye    Current Outpatient Medications on File Prior to Visit  Medication Sig Dispense Refill  . penicillin v potassium (VEETID) 500 MG tablet TK 1 T PO QID UNTIL GONE  0   No  current facility-administered medications on file prior to visit.     Past Surgical History:  Procedure Laterality Date  . WRIST FRACTURE SURGERY Left 2017    No Known Allergies  Social History   Socioeconomic History  . Marital status: Married    Spouse name: Not on file  . Number of children: Not on file  . Years of education: Not on file  . Highest education level: Not on file  Occupational History  . Not on file  Social Needs  . Financial resource strain: Not on file  . Food insecurity:    Worry: Not on file    Inability: Not on file  . Transportation needs:    Medical: Not on file    Non-medical: Not on file  Tobacco Use  . Smoking status: Current Some Day Smoker  . Smokeless tobacco: Never Used  Substance and Sexual Activity  . Alcohol use: Yes    Comment: occassionally   . Drug use: No  . Sexual activity: Not on file  Lifestyle  . Physical activity:    Days per week: Not on file    Minutes per session: Not on file  . Stress: Not on file  Relationships  . Social connections:    Talks on phone: Not on file    Gets together: Not on file    Attends religious service: Not on file    Active member of club or organization:  Not on file    Attends meetings of clubs or organizations: Not on file    Relationship status: Not on file  . Intimate partner violence:    Fear of current or ex partner: Not on file    Emotionally abused: Not on file    Physically abused: Not on file    Forced sexual activity: Not on file  Other Topics Concern  . Not on file  Social History Narrative  . Not on file    History reviewed. No pertinent family history.  BP (!) 154/88   Pulse 68   Ht 5\' 11"  (1.803 m)   Wt 216 lb (98 kg)   BMI 30.13 kg/m   Review of Systems: See HPI above.     Objective:  Physical Exam:  Gen: NAD, comfortable in exam room  Right shoulder: No swelling, ecchymoses.  No gross deformity. No TTP. FROM. Negative Hawkins, Neers. Negative  Yergasons. Strength 5/5 with empty can and resisted internal/external rotation. Negative apprehension. NV intact distally. Negative o'briens.   Assessment & Plan:  1.  Right shoulder pain and instability - clinically improved with home exercise program - only done a visit of PT to date and has 3 scheduled including today - continue with this, transition to home exercises as tolerated.  F/u prn.

## 2018-05-21 ENCOUNTER — Ambulatory Visit: Payer: Managed Care, Other (non HMO) | Attending: Family Medicine

## 2018-05-23 ENCOUNTER — Ambulatory Visit: Payer: Managed Care, Other (non HMO) | Admitting: Physical Therapy

## 2019-06-25 ENCOUNTER — Emergency Department (HOSPITAL_BASED_OUTPATIENT_CLINIC_OR_DEPARTMENT_OTHER)
Admission: EM | Admit: 2019-06-25 | Discharge: 2019-06-25 | Disposition: A | Discharge status: Home / Self Care | Payer: Managed Care, Other (non HMO) | Attending: Emergency Medicine | Admitting: Emergency Medicine

## 2019-06-25 ENCOUNTER — Encounter (HOSPITAL_BASED_OUTPATIENT_CLINIC_OR_DEPARTMENT_OTHER): Payer: Self-pay

## 2019-06-25 ENCOUNTER — Other Ambulatory Visit: Payer: Self-pay

## 2019-06-25 DIAGNOSIS — Z5321 Procedure and treatment not carried out due to patient leaving prior to being seen by health care provider: Secondary | ICD-10-CM | POA: Insufficient documentation

## 2019-06-25 DIAGNOSIS — M25511 Pain in right shoulder: Secondary | ICD-10-CM | POA: Insufficient documentation

## 2019-06-25 NOTE — ED Triage Notes (Addendum)
Pt c/o right shoulder pain since having a dislocation ~5 months ago-denies recent injury-pt states he had PT then stopped going-NAD-steady gait

## 2019-06-30 ENCOUNTER — Emergency Department (HOSPITAL_BASED_OUTPATIENT_CLINIC_OR_DEPARTMENT_OTHER)
Admission: EM | Admit: 2019-06-30 | Discharge: 2019-07-01 | Disposition: A | Discharge status: Home / Self Care | Payer: Managed Care, Other (non HMO) | Attending: Emergency Medicine | Admitting: Emergency Medicine

## 2019-06-30 ENCOUNTER — Encounter (HOSPITAL_BASED_OUTPATIENT_CLINIC_OR_DEPARTMENT_OTHER): Payer: Self-pay | Admitting: Emergency Medicine

## 2019-06-30 ENCOUNTER — Emergency Department (HOSPITAL_BASED_OUTPATIENT_CLINIC_OR_DEPARTMENT_OTHER): Payer: Managed Care, Other (non HMO)

## 2019-06-30 ENCOUNTER — Other Ambulatory Visit: Payer: Self-pay

## 2019-06-30 DIAGNOSIS — I10 Essential (primary) hypertension: Secondary | ICD-10-CM | POA: Diagnosis not present

## 2019-06-30 DIAGNOSIS — Z79899 Other long term (current) drug therapy: Secondary | ICD-10-CM | POA: Diagnosis not present

## 2019-06-30 DIAGNOSIS — Z87891 Personal history of nicotine dependence: Secondary | ICD-10-CM | POA: Insufficient documentation

## 2019-06-30 DIAGNOSIS — M25511 Pain in right shoulder: Secondary | ICD-10-CM | POA: Diagnosis not present

## 2019-06-30 MED ORDER — IBUPROFEN 800 MG PO TABS
800.0000 mg | ORAL_TABLET | Freq: Once | ORAL | Status: AC
  Administered 2019-06-30: 800 mg via ORAL
  Filled 2019-06-30: qty 1

## 2019-06-30 MED ORDER — DICLOFENAC SODIUM ER 100 MG PO TB24: 100.0000 mg | ORAL_TABLET | Freq: Every day | ORAL | 0 refills | Status: DC

## 2019-06-30 MED ORDER — LIDOCAINE 5 % EX PTCH: 1.0000 | MEDICATED_PATCH | CUTANEOUS | 0 refills | Status: DC

## 2019-06-30 MED ORDER — ACETAMINOPHEN 500 MG PO TABS
1000.0000 mg | ORAL_TABLET | Freq: Once | ORAL | Status: AC
  Administered 2019-06-30: 1000 mg via ORAL
  Filled 2019-06-30: qty 2

## 2019-06-30 NOTE — ED Notes (Signed)
ED Provider at bedside. 

## 2019-06-30 NOTE — ED Triage Notes (Signed)
Patient complains of right shoulder pain; states dislocation approx 5 months ago; states went to rehab then discontinued it. States having shooting pains down right arm onset approx 5-7 days ago; states took [employer] 2000 today

## 2019-07-01 ENCOUNTER — Encounter (HOSPITAL_BASED_OUTPATIENT_CLINIC_OR_DEPARTMENT_OTHER): Payer: Self-pay | Admitting: Emergency Medicine
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
## 2019-07-02 ENCOUNTER — Ambulatory Visit (INDEPENDENT_AMBULATORY_CARE_PROVIDER_SITE_OTHER): Payer: Managed Care, Other (non HMO) | Admitting: Family Medicine

## 2019-07-02 ENCOUNTER — Other Ambulatory Visit: Payer: Self-pay

## 2019-07-02 ENCOUNTER — Encounter: Payer: Self-pay | Admitting: Family Medicine

## 2019-07-02 VITALS — BP 138/100 | Ht 71.0 in | Wt 225.0 lb

## 2019-07-02 DIAGNOSIS — M5412 Radiculopathy, cervical region: Secondary | ICD-10-CM

## 2019-07-02 MED ORDER — PREDNISONE 10 MG PO TABS: ORAL_TABLET | ORAL | 0 refills | Status: DC

## 2019-07-02 NOTE — Patient Instructions (Signed)
Your shoulder and arm pain is caused by a pinched nerve in the neck -Start taking the steroid Dosepak that was sent to the pharmacy for you.  This will last to 6 days.  Take the medication as directed on the packaging -Do not take anti-inflammatories while you are taking the steroid -If the steroids do not improve your pain we will consider getting x-rays of your neck to further evaluate the cause of your pain  We will see you back as needed

## 2019-07-02 NOTE — Progress Notes (Signed)
PCP: Fortino Sic, PA  Subjective:   HPI: Patient is a 44 y.o. male here for evaluation of right shoulder and arm pain.  Patient notes pain is been present for the last 2 weeks.  The pain starts in the shoulder and radiates down his arm into his hands.  He also has numbness and tingling in the same distribution.  Patient has been struggling with shoulder pain for the last several months however he notes his pain is slightly different.  Previously the pain did not radiate down his arm.  He had been receiving treatment for rotator cuff tendinitis with doing physical therapy.  Patient denies any specific injury or trauma that preceded the pain this time around.  Patient denies pain with reaching his arm above his head or behind his back.  He denies any weakness in his arm.  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   Review of Systems: See HPI above.  Past Medical History:  Diagnosis Date  . Hypertension   . Sarcoidosis    right eye    Current Outpatient Medications on File Prior to Visit  Medication Sig Dispense Refill  . Diclofenac Sodium CR 100 MG 24 hr tablet Take 1 tablet (100 mg total) by mouth daily. 7 tablet 0  . lidocaine (LIDODERM) 5 % Place 1 patch onto the skin daily. Remove & Discard patch within 12 hours or as directed by MD 30 patch 0  . penicillin v potassium (VEETID) 500 MG tablet TK 1 T PO QID UNTIL GONE  0   No current facility-administered medications on file prior to visit.    Past Surgical History:  Procedure Laterality Date  . WRIST FRACTURE SURGERY Left 2017    No Known Allergies  Social History   Socioeconomic History  . Marital status: Married    Spouse name: Not on file  . Number of children: Not on file  . Years of education: Not on file  . Highest education level: Not on file  Occupational History  . Not on file  Tobacco Use  . Smoking status: Former Research scientist (life sciences)  . Smokeless  tobacco: Never Used  Substance and Sexual Activity  . Alcohol use: Yes    Comment: occassionally   . Drug use: No  . Sexual activity: Not on file  Other Topics Concern  . Not on file  Social History Narrative  . Not on file   Social Determinants of Health   Financial Resource Strain:   . Difficulty of Paying Living Expenses: Not on file  Food Insecurity:   . Worried About Charity fundraiser in the Last Year: Not on file  . Ran Out of Food in the Last Year: Not on file  Transportation Needs:   . Lack of Transportation (Medical): Not on file  . Lack of Transportation (Non-Medical): Not on file  Physical Activity:   . Days of Exercise per Week: Not on file  . Minutes of Exercise per Session: Not on file  Stress:   . Feeling of Stress : Not on file  Social Connections:   . Frequency of Communication with Friends and Family: Not on file  . Frequency of Social Gatherings with Friends and Family: Not on file  . Attends Religious Services: Not on file  . Active Member of Clubs or Organizations: Not on file  . Attends Archivist Meetings: Not on file  . Marital Status: Not  on file  Intimate Partner Violence:   . Fear of Current or Ex-Partner: Not on file  . Emotionally Abused: Not on file  . Physically Abused: Not on file  . Sexually Abused: Not on file    History reviewed. No pertinent family history.      Objective:  Physical Exam: There were no vitals taken for this visit. Gen: NAD, comfortable in exam room Lungs: Breathing comfortably on room air Shoulder Exam Right -Inspection: No discoloration, no deformity -Palpation: No tenderness to palpation -ROM (active): Abduction: 180 degrees; Forward Flexion: 180 degrees; Internal Rotation: T10 -Strength: Abduction: 5/5; Forward Flexion: 5/5; Internal Rotation: 5/5; External Rotation: 5/5 -Special Tests: Hawkins: Positive; Neers: Negative; Jobs: Negative; O'briens: Negative; Speeds: Negative; Yergasons: Negative;  Cross arm: negative; Apprehension: Negative -Scapular Motion: Normal alignment. No notable protraction/retraction. No scapular winging -Limb neurovascularly intact, no instability noted  Contralateral Shoulder -Inspection: No discoloration, no deformity -Palpation: No tenderness to palpation -ROM (active): Abduction: 180 degrees; Forward Flexion: 180 degrees; Internal Rotation: T10 -Strength: Abduction: 5/5; Forward Flexion: 5/5; Internal Rotation: 5/5; External Rotation: 5/5 -Limb neurovascularly intact, no instability noted  Cervical Exam:  -Full range of motion with flexion, extension, lateral rotation.  -Spurlings: Positive on right    Assessment & Plan:  Patient is a 44 y.o. male here for right shoulder pain  1.  Right cervical radiculopathy -Patient given a prescription for prednisone Dosepak -Patient vies not to take other over-the-counter anti-inflammatories while taking his Dosepak -Patient was offered x-rays today however he declined.  If his pain does not resolve in the future will consider x-rays along with more advanced imaging the form of an MRI  Patient will follow up on an as-needed basis

## 2019-10-21 ENCOUNTER — Emergency Department (HOSPITAL_COMMUNITY): Payer: Managed Care, Other (non HMO)

## 2019-10-21 ENCOUNTER — Encounter (HOSPITAL_COMMUNITY): Payer: Self-pay

## 2019-10-21 ENCOUNTER — Emergency Department (HOSPITAL_COMMUNITY)
Admission: EM | Admit: 2019-10-21 | Discharge: 2019-10-21 | Disposition: A | Discharge status: Home / Self Care | Payer: Managed Care, Other (non HMO) | Attending: Emergency Medicine | Admitting: Emergency Medicine

## 2019-10-21 DIAGNOSIS — Y9389 Activity, other specified: Secondary | ICD-10-CM | POA: Diagnosis not present

## 2019-10-21 DIAGNOSIS — S59901A Unspecified injury of right elbow, initial encounter: Secondary | ICD-10-CM | POA: Diagnosis present

## 2019-10-21 DIAGNOSIS — Z79899 Other long term (current) drug therapy: Secondary | ICD-10-CM | POA: Diagnosis not present

## 2019-10-21 DIAGNOSIS — Z87891 Personal history of nicotine dependence: Secondary | ICD-10-CM | POA: Insufficient documentation

## 2019-10-21 DIAGNOSIS — I10 Essential (primary) hypertension: Secondary | ICD-10-CM | POA: Insufficient documentation

## 2019-10-21 DIAGNOSIS — Z23 Encounter for immunization: Secondary | ICD-10-CM | POA: Insufficient documentation

## 2019-10-21 DIAGNOSIS — S50311A Abrasion of right elbow, initial encounter: Secondary | ICD-10-CM | POA: Insufficient documentation

## 2019-10-21 DIAGNOSIS — Y999 Unspecified external cause status: Secondary | ICD-10-CM | POA: Insufficient documentation

## 2019-10-21 DIAGNOSIS — Y92410 Unspecified street and highway as the place of occurrence of the external cause: Secondary | ICD-10-CM | POA: Diagnosis not present

## 2019-10-21 MED ORDER — TETANUS-DIPHTH-ACELL PERTUSSIS 5-2.5-18.5 LF-MCG/0.5 IM SUSP
0.5000 mL | Freq: Once | INTRAMUSCULAR | Status: AC
  Administered 2019-10-21: 20:00:00 0.5 mL via INTRAMUSCULAR
  Filled 2019-10-21: qty 0.5

## 2019-10-21 MED ORDER — TETANUS-DIPHTHERIA TOXOIDS TD 5-2 LFU IM INJ: 0.5000 mL | INJECTION | Freq: Once | INTRAMUSCULAR | Status: DC

## 2019-10-21 MED ORDER — ONDANSETRON HCL 4 MG PO TABS
4.0000 mg | ORAL_TABLET | Freq: Once | ORAL | Status: AC
  Administered 2019-10-21: 22:00:00 4 mg via ORAL
  Filled 2019-10-21: qty 1

## 2019-10-21 NOTE — Discharge Instructions (Addendum)
Return for any problem.  Follow-up with your regular care provider as instructed. °
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
## 2020-12-09 ENCOUNTER — Ambulatory Visit (HOSPITAL_COMMUNITY): Payer: No Payment, Other | Admitting: Behavioral Health

## 2021-03-14 ENCOUNTER — Encounter (HOSPITAL_BASED_OUTPATIENT_CLINIC_OR_DEPARTMENT_OTHER): Payer: Self-pay

## 2021-03-14 ENCOUNTER — Emergency Department (HOSPITAL_BASED_OUTPATIENT_CLINIC_OR_DEPARTMENT_OTHER): Payer: BC Managed Care – PPO

## 2021-03-14 ENCOUNTER — Other Ambulatory Visit: Payer: Self-pay

## 2021-03-14 ENCOUNTER — Emergency Department (HOSPITAL_BASED_OUTPATIENT_CLINIC_OR_DEPARTMENT_OTHER)
Admission: EM | Admit: 2021-03-14 | Discharge: 2021-03-14 | Disposition: A | Discharge status: Home / Self Care | Payer: BC Managed Care – PPO | Attending: Emergency Medicine | Admitting: Emergency Medicine

## 2021-03-14 DIAGNOSIS — Z87891 Personal history of nicotine dependence: Secondary | ICD-10-CM | POA: Insufficient documentation

## 2021-03-14 DIAGNOSIS — X58XXXA Exposure to other specified factors, initial encounter: Secondary | ICD-10-CM | POA: Diagnosis not present

## 2021-03-14 DIAGNOSIS — S4991XA Unspecified injury of right shoulder and upper arm, initial encounter: Secondary | ICD-10-CM | POA: Insufficient documentation

## 2021-03-14 DIAGNOSIS — M24411 Recurrent dislocation, right shoulder: Secondary | ICD-10-CM | POA: Diagnosis not present

## 2021-03-14 DIAGNOSIS — I10 Essential (primary) hypertension: Secondary | ICD-10-CM | POA: Insufficient documentation

## 2021-03-14 DIAGNOSIS — M25511 Pain in right shoulder: Secondary | ICD-10-CM

## 2021-03-14 DIAGNOSIS — M2440 Recurrent dislocation, unspecified joint: Secondary | ICD-10-CM

## 2021-03-14 NOTE — ED Provider Notes (Signed)
Emergency Department Provider Note   I have reviewed the triage vital signs and the nursing notes.   HISTORY  Chief Complaint Shoulder Injury   HPI Brett Allen is a 45 y.o. male presents to the ED with right shoulder pain and intermittent dislocation/relocation of the joint. He has prior history of similar in the past. Has seen sports medicine with PT but symptoms are recurring. He is not seen an orthopedic surgeon. He notes multiple episodes of shoulder dislocation this week and today. He is able to relocate the joint once it is out but describes pain. No numbness/tingling in the area. No fever or joint redness.    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  No family history on file.  Social History Social History   Tobacco Use   Smoking status: Former    Types: Cigarettes   Smokeless tobacco: Never  Vaping Use   Vaping Use: Never used  Substance Use Topics   Alcohol use: Yes    Comment: occassionally    Drug use: No    Review of Systems  Constitutional: No fever/chills Eyes: No visual changes. ENT: No sore throat. Cardiovascular: Denies chest pain. Respiratory: Denies shortness of breath. Gastrointestinal: No abdominal pain.  No nausea, no vomiting.  No diarrhea.  No constipation. Genitourinary: Negative for dysuria. Musculoskeletal: Positive shoulder pain/dislocation.  Skin: Negative for rash. Neurological: Negative for headaches, focal weakness or numbness.  10-point ROS otherwise negative.  ____________________________________________   PHYSICAL EXAM:  VITAL  SIGNS: ED Triage Vitals  Enc Vitals Group     BP 03/14/21 1148 (!) 148/116     Pulse Rate 03/14/21 1148 99     Resp 03/14/21 1148 20     Temp 03/14/21 1148 98.7 F (37.1 C)     Temp Source 03/14/21 1148 Oral     SpO2 03/14/21 1148 98 %     Weight 03/14/21 1146 219 lb (99.3 kg)     Height 03/14/21 1146 5\' 11"  (1.803 m)   Constitutional: Alert and oriented. Well appearing and in no acute distress. Eyes: Conjunctivae are normal.  Head: Atraumatic. Nose: No congestion/rhinnorhea. Mouth/Throat: Mucous membranes are moist.   Neck: No stridor.   Cardiovascular: Good peripheral circulation.  Respiratory: Normal respiratory effort.  Gastrointestinal:  No distention.  Musculoskeletal: Normal ROM of the right shoulder and elbow. Joint appears well seated at this time. . Neurologic:  Normal speech and language. No gross focal neurologic deficits are appreciated.  Skin:  Skin is warm, dry and intact. No rash noted.  ____________________________________________  RADIOLOGY  DG Shoulder Right  Result Date: 03/14/2021 CLINICAL DATA:  History of shoulder popping out of joint, popped out yesterday EXAM: RIGHT SHOULDER - 2+ VIEW COMPARISON:  Shoulder radiographs 06/30/2019 FINDINGS: There is no acute fracture or dislocation. A probable Hill-Sachs lesion is again seen. Glenohumeral alignment is normal. The acromioclavicular joint is maintained. There is mild degenerative change of the [facility] joint. The soft tissues are unremarkable. IMPRESSION: No acute fracture or dislocation. Electronically Signed   By: [facility] M.D.   On: 03/14/2021 12:35  ____________________________________________   PROCEDURES  Procedure(s) performed:   Procedures  None ____________________________________________   INITIAL IMPRESSION / ASSESSMENT AND PLAN / ED COURSE  Pertinent labs & imaging results that were available during my care of the patient were reviewed by me and considered in my medical decision making  (see chart for details).   Patient with intermittent shoulder dislocation/relocation and pain. Plain films similar to prior with Tamera Reason lesion noted as on prior studies. No shoulder dislocation here. Patient referred to ortho surgeon on call. He has a sling at home. Advised to use with ROM exercises and NSAIDs. Provided a work note. Discussed ED return precautions.    ____________________________________________  FINAL CLINICAL IMPRESSION(S) / ED DIAGNOSES  Final diagnoses:  Acute pain of right shoulder  Recurrent dislocation       Note:  This document was prepared using Dragon voice recognition software and may include unintentional dictation errors.  Alona Bene, MD, [facility]    Brett Allen, Brett Repress, MD 03/18/21 1031

## 2021-03-14 NOTE — Discharge Instructions (Signed)
You were seen in the emergency department today with shoulder pain.  I have listed the name of the orthopedic surgeon on-call.  Please reach out to their office today to schedule the next available appointment.  They can further assess your shoulder pain and decide if additional treatments and/or surgery are indicated.  You may use your sling for comfort but be sure to perform range of motion exercises with the shoulder intermittently throughout the day to keep the joint healthy and moving.  If your shoulder dislocates and you are unable to get it back into the joint please return to the emergency department.

## 2021-03-14 NOTE — ED Triage Notes (Addendum)
Pt c/o intermittent right shoulder dislocation x ~1 year-worse x 2 yesterday-pt states he is usually able to reduce-pt reports hx of rehab with possible surgery-NAD-steady gait

## 2021-05-21 DIAGNOSIS — J019 Acute sinusitis, unspecified: Secondary | ICD-10-CM | POA: Diagnosis not present

## 2021-05-21 DIAGNOSIS — R6883 Chills (without fever): Secondary | ICD-10-CM | POA: Diagnosis not present

## 2021-05-21 DIAGNOSIS — J3489 Other specified disorders of nose and nasal sinuses: Secondary | ICD-10-CM | POA: Diagnosis not present

## 2021-05-21 DIAGNOSIS — J101 Influenza due to other identified influenza virus with other respiratory manifestations: Secondary | ICD-10-CM | POA: Diagnosis not present

## 2022-02-22 ENCOUNTER — Encounter (HOSPITAL_BASED_OUTPATIENT_CLINIC_OR_DEPARTMENT_OTHER): Payer: Self-pay

## 2022-02-22 ENCOUNTER — Other Ambulatory Visit: Payer: Self-pay

## 2022-02-22 ENCOUNTER — Emergency Department (HOSPITAL_BASED_OUTPATIENT_CLINIC_OR_DEPARTMENT_OTHER)
Admission: EM | Admit: 2022-02-22 | Discharge: 2022-02-22 | Disposition: A | Discharge status: Home / Self Care | Payer: 59 | Attending: Emergency Medicine | Admitting: Emergency Medicine

## 2022-02-22 DIAGNOSIS — Y990 Civilian activity done for income or pay: Secondary | ICD-10-CM | POA: Insufficient documentation

## 2022-02-22 DIAGNOSIS — M5442 Lumbago with sciatica, left side: Secondary | ICD-10-CM

## 2022-02-22 DIAGNOSIS — X58XXXA Exposure to other specified factors, initial encounter: Secondary | ICD-10-CM | POA: Diagnosis not present

## 2022-02-22 DIAGNOSIS — S39012A Strain of muscle, fascia and tendon of lower back, initial encounter: Secondary | ICD-10-CM | POA: Diagnosis not present

## 2022-02-22 DIAGNOSIS — S3992XA Unspecified injury of lower back, initial encounter: Secondary | ICD-10-CM | POA: Diagnosis present

## 2022-02-22 DIAGNOSIS — I10 Essential (primary) hypertension: Secondary | ICD-10-CM | POA: Insufficient documentation

## 2022-02-22 DIAGNOSIS — Z760 Encounter for issue of repeat prescription: Secondary | ICD-10-CM | POA: Insufficient documentation

## 2022-02-22 DIAGNOSIS — Z79899 Other long term (current) drug therapy: Secondary | ICD-10-CM | POA: Insufficient documentation

## 2022-02-22 DIAGNOSIS — E119 Type 2 diabetes mellitus without complications: Secondary | ICD-10-CM | POA: Diagnosis not present

## 2022-02-22 LAB — CBG MONITORING, ED: Glucose-Capillary: 175 mg/dL — ABNORMAL HIGH (ref 70–99)

## 2022-02-22 MED ORDER — METHOCARBAMOL 750 MG PO TABS: 750.0000 mg | ORAL_TABLET | Freq: Three times a day (TID) | ORAL | 0 refills | Status: AC | PRN

## 2022-02-22 MED ORDER — METFORMIN HCL ER 750 MG PO TB24: 750.0000 mg | ORAL_TABLET | Freq: Every day | ORAL | 0 refills | Status: DC

## 2022-02-22 MED ORDER — DEXAMETHASONE SODIUM PHOSPHATE 10 MG/ML IJ SOLN
10.0000 mg | Freq: Once | INTRAMUSCULAR | Status: DC
  Filled 2022-02-22: qty 1

## 2022-02-22 MED ORDER — LISINOPRIL-HYDROCHLOROTHIAZIDE 20-25 MG PO TABS: 1.0000 | ORAL_TABLET | Freq: Every day | ORAL | 0 refills | Status: DC

## 2022-02-22 MED ORDER — DEXAMETHASONE SODIUM PHOSPHATE 10 MG/ML IJ SOLN
10.0000 mg | Freq: Once | INTRAMUSCULAR | Status: AC
  Administered 2022-02-22: 10 mg via INTRAMUSCULAR

## 2022-02-22 MED ORDER — PREDNISONE 50 MG PO TABS: 50.0000 mg | ORAL_TABLET | ORAL | 0 refills | Status: AC

## 2022-02-22 NOTE — ED Triage Notes (Signed)
C/o lumbar back pain, has had it for 10 years however started a new job where he has increasing bending and lifting.  States feet intermittently go numb

## 2022-02-22 NOTE — ED Notes (Signed)
Patients blood pressure was elevated. Patient denied any s/s of hypertension. Education on the important of taking blood pressure medication.

## 2022-02-22 NOTE — ED Notes (Signed)
Patient states that he is having pain on his left lower back . Alert x4

## 2022-02-22 NOTE — Discharge Instructions (Addendum)
Take all the prescribed medications.  Avoid driving while taking muscle relaxant.  You may make sleepy or drowsy. Heating pad to lower back.  Bending and stretching exercises as tolerated. Refill was given for metformin and lisinopril-hydrochlorothiazide.  Follow-up with your primary care physician for future refills.
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
## 2022-03-26 ENCOUNTER — Other Ambulatory Visit: Payer: Self-pay | Admitting: Family Medicine

## 2022-07-29 IMAGING — CR DG SHOULDER 2+V*R*
3 series · 3 of 3 positions shown · non-contrast
Comparison: Shoulder radiographs 06/30/2019

CLINICAL DATA: History of shoulder popping out of joint, popped out
yesterday

EXAM:
RIGHT SHOULDER - 2+ VIEW

[w shoulder grashey right]
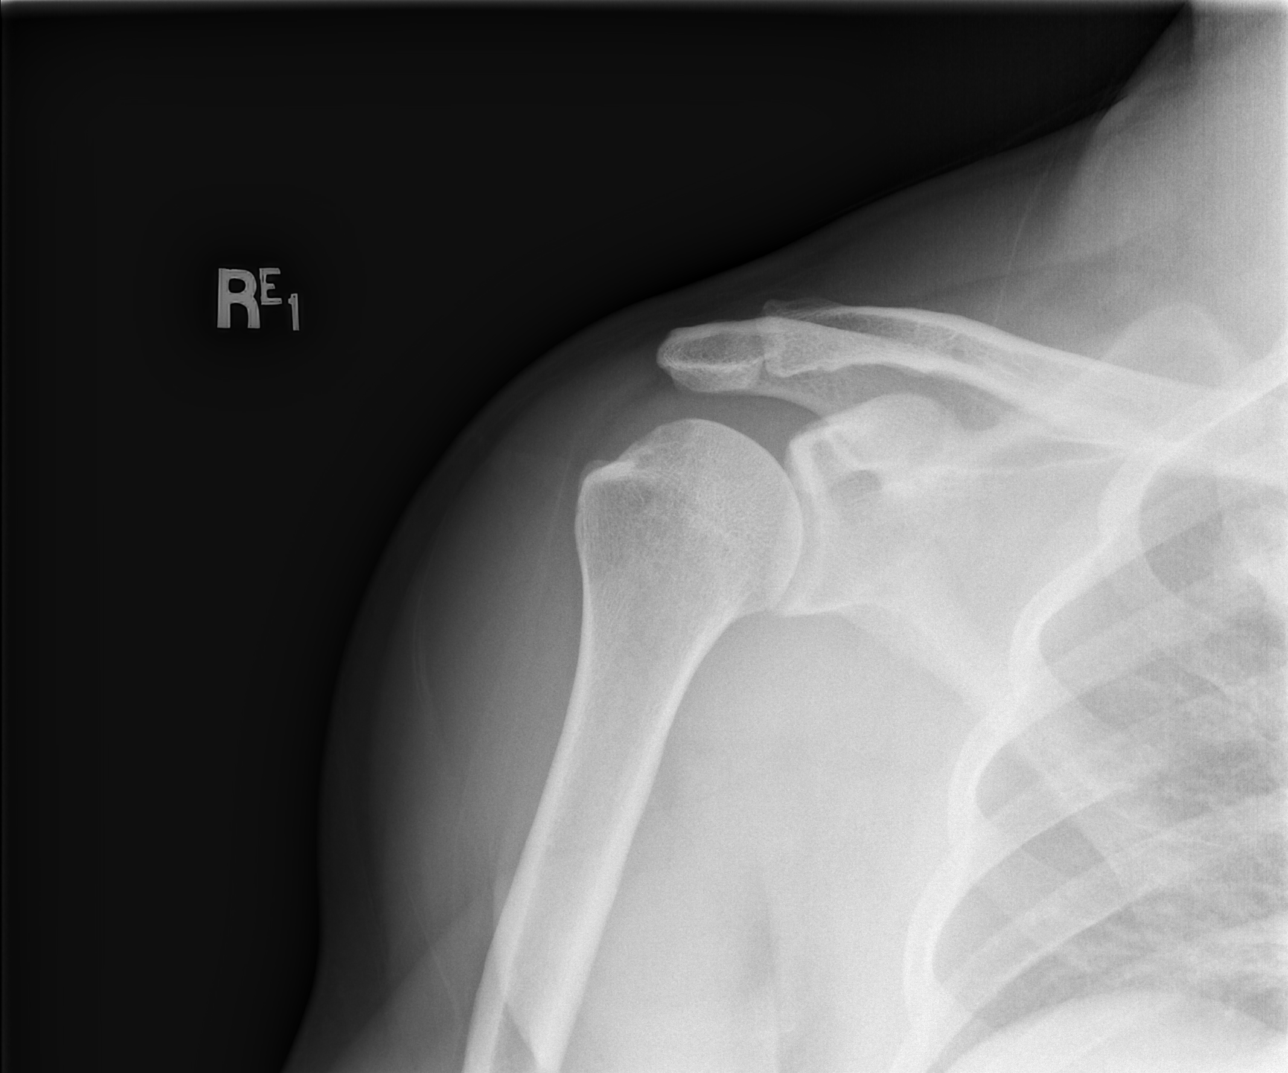

[w shoulder y view right]
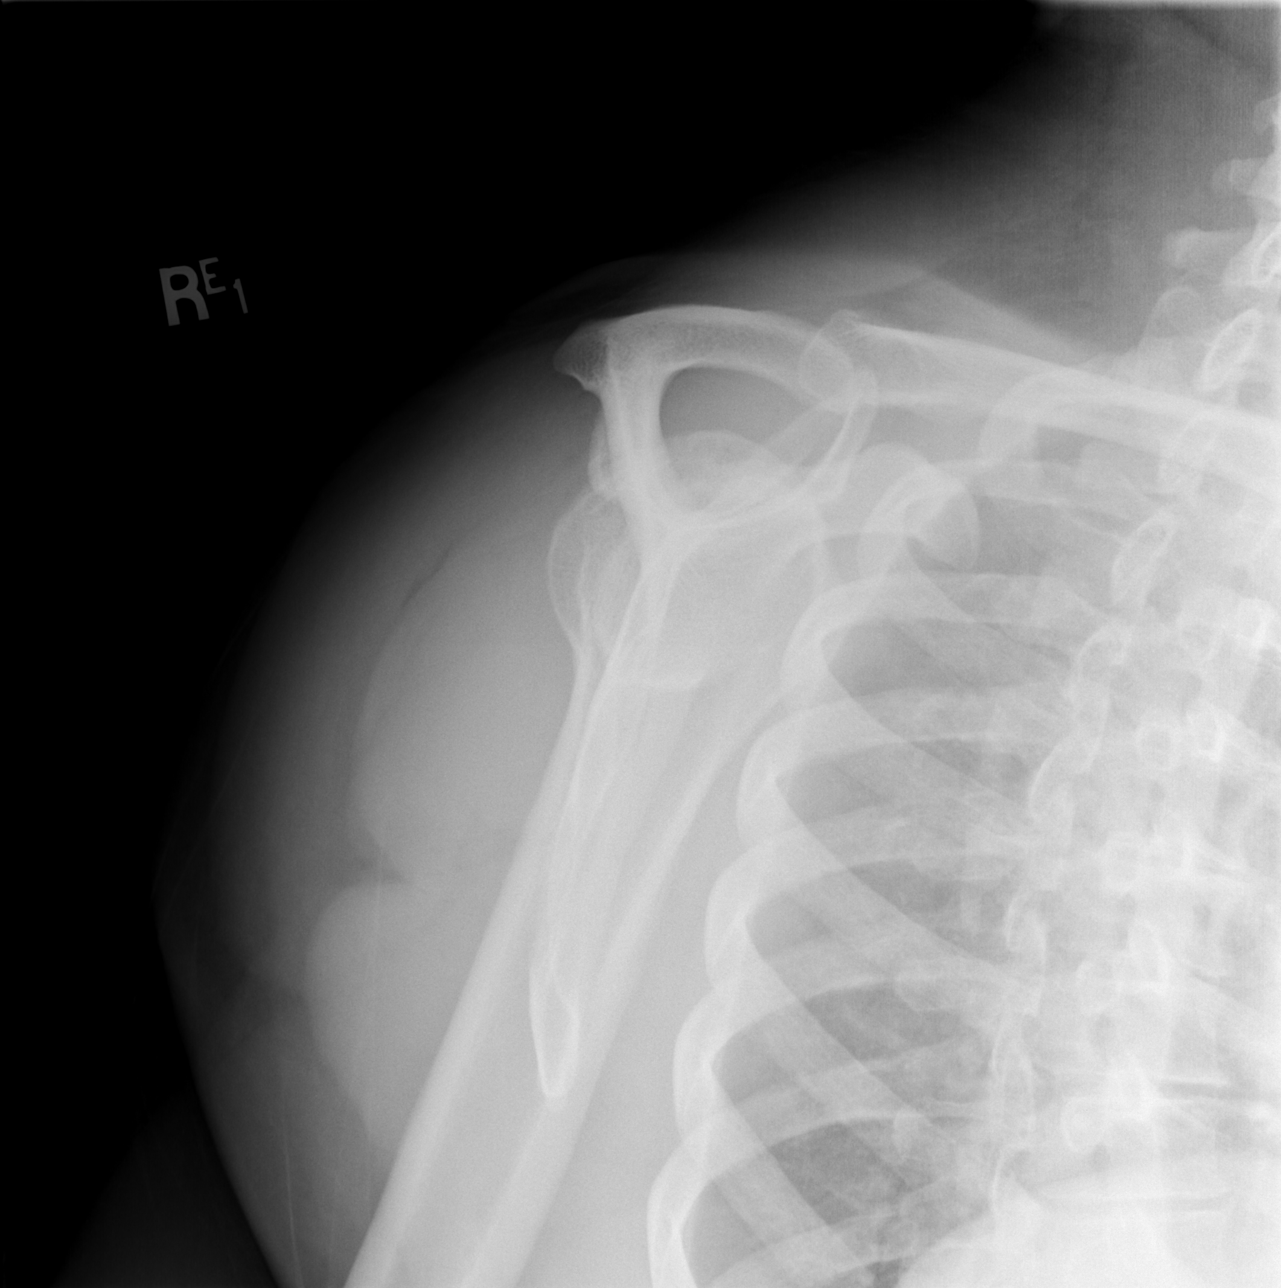

[x shoulder axillary right]
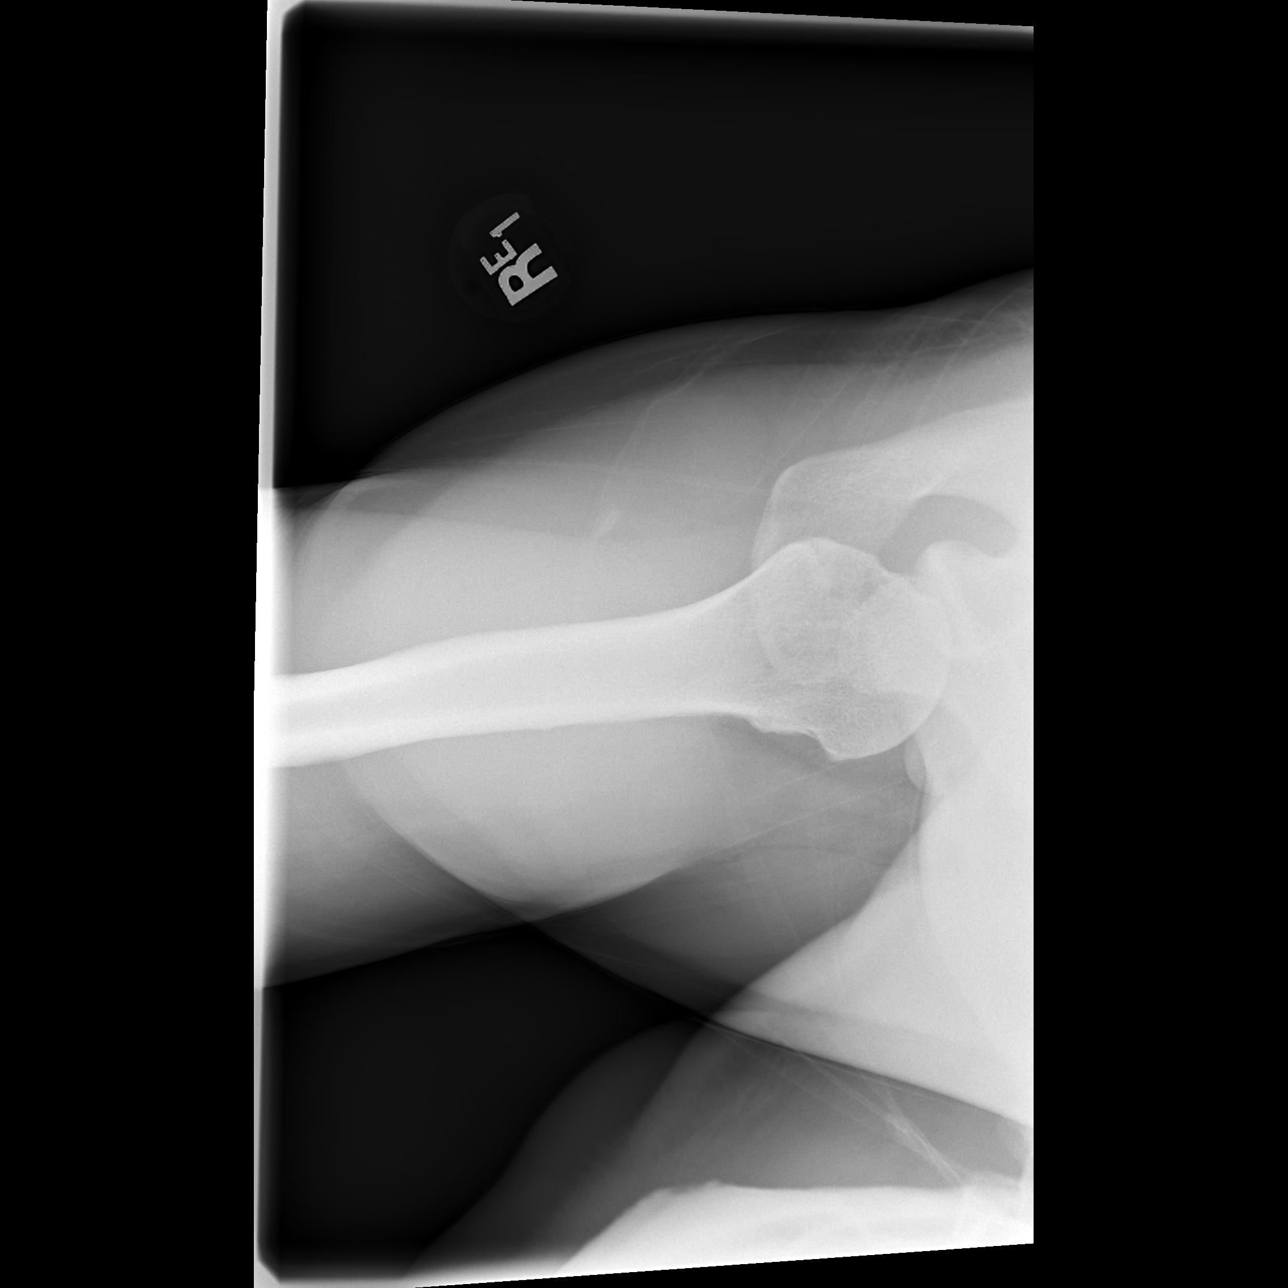

[3 of 3 positions shown; findings below may reference images not displayed]

FINDINGS: There is no acute fracture or dislocation. A probable Hill-Sachs
lesion is again seen. Glenohumeral alignment is normal. The
acromioclavicular joint is maintained. There is mild degenerative
change of the AC joint. The soft tissues are unremarkable.
IMPRESSION: No acute fracture or dislocation.

## 2022-08-02 ENCOUNTER — Encounter (HOSPITAL_BASED_OUTPATIENT_CLINIC_OR_DEPARTMENT_OTHER): Payer: Self-pay | Admitting: Emergency Medicine

## 2022-08-02 ENCOUNTER — Emergency Department (HOSPITAL_BASED_OUTPATIENT_CLINIC_OR_DEPARTMENT_OTHER): Payer: BC Managed Care – PPO

## 2022-08-02 ENCOUNTER — Emergency Department (HOSPITAL_BASED_OUTPATIENT_CLINIC_OR_DEPARTMENT_OTHER)
Admission: EM | Admit: 2022-08-02 | Discharge: 2022-08-02 | Disposition: A | Discharge status: Home / Self Care | Payer: BC Managed Care – PPO | Attending: Emergency Medicine | Admitting: Emergency Medicine

## 2022-08-02 ENCOUNTER — Other Ambulatory Visit: Payer: Self-pay

## 2022-08-02 DIAGNOSIS — E860 Dehydration: Secondary | ICD-10-CM

## 2022-08-02 DIAGNOSIS — Z7984 Long term (current) use of oral hypoglycemic drugs: Secondary | ICD-10-CM | POA: Insufficient documentation

## 2022-08-02 DIAGNOSIS — K921 Melena: Secondary | ICD-10-CM

## 2022-08-02 DIAGNOSIS — E1165 Type 2 diabetes mellitus with hyperglycemia: Secondary | ICD-10-CM | POA: Insufficient documentation

## 2022-08-02 DIAGNOSIS — I10 Essential (primary) hypertension: Secondary | ICD-10-CM | POA: Diagnosis not present

## 2022-08-02 DIAGNOSIS — R42 Dizziness and giddiness: Secondary | ICD-10-CM | POA: Diagnosis not present

## 2022-08-02 DIAGNOSIS — Z91148 Patient's other noncompliance with medication regimen for other reason: Secondary | ICD-10-CM

## 2022-08-02 DIAGNOSIS — R531 Weakness: Secondary | ICD-10-CM

## 2022-08-02 DIAGNOSIS — Z1152 Encounter for screening for COVID-19: Secondary | ICD-10-CM | POA: Diagnosis not present

## 2022-08-02 HISTORY — DX: Type 2 diabetes mellitus without complications: E11.9

## 2022-08-02 LAB — COMPREHENSIVE METABOLIC PANEL
ALT: 20 U/L (ref 0–44)
AST: 21 U/L (ref 15–41)
Albumin: 4.4 g/dL (ref 3.5–5.0)
Alkaline Phosphatase: 88 U/L (ref 38–126)
Anion gap: 11 (ref 5–15)
BUN: 17 mg/dL (ref 6–20)
CO2: 26 mmol/L (ref 22–32)
Calcium: 9.7 mg/dL (ref 8.9–10.3)
Chloride: 94 mmol/L — ABNORMAL LOW (ref 98–111)
Creatinine, Ser: 1.34 mg/dL — ABNORMAL HIGH (ref 0.61–1.24)
GFR, Estimated: >60 mL/min (ref >60)
Glucose, Bld: 317 mg/dL — ABNORMAL HIGH (ref 70–99)
Potassium: 3.6 mmol/L (ref 3.5–5.1)
Sodium: 131 mmol/L — ABNORMAL LOW (ref 135–145)
Total Bilirubin: 0.5 mg/dL (ref 0.3–1.2)
Total Protein: 8.5 g/dL — ABNORMAL HIGH (ref 6.5–8.1)

## 2022-08-02 LAB — CBC WITH DIFFERENTIAL/PLATELET
Abs Immature Granulocytes: 0.01 10*3/uL (ref 0.00–0.07)
Basophils Absolute: 0 10*3/uL (ref 0.0–0.1)
Basophils Relative: 1 %
Eosinophils Absolute: 0.1 10*3/uL (ref 0.0–0.5)
Eosinophils Relative: 1 %
HCT: 49.5 % (ref 39.0–52.0)
Hemoglobin: 16.4 g/dL (ref 13.0–17.0)
Immature Granulocytes: 0 %
Lymphocytes Relative: 58 %
Lymphs Abs: 3.5 10*3/uL (ref 0.7–4.0)
MCH: 25.3 pg — ABNORMAL LOW (ref 26.0–34.0)
MCHC: 33.1 g/dL (ref 30.0–36.0)
MCV: 76.5 fL — ABNORMAL LOW (ref 80.0–100.0)
Monocytes Absolute: 0.4 10*3/uL (ref 0.1–1.0)
Monocytes Relative: 7 %
Neutro Abs: 2 10*3/uL (ref 1.7–7.7)
Neutrophils Relative %: 33 %
Platelets: 276 10*3/uL (ref 150–400)
RBC: 6.47 MIL/uL — ABNORMAL HIGH (ref 4.22–5.81)
RDW: 12.5 % (ref 11.5–15.5)
WBC: 6.1 10*3/uL (ref 4.0–10.5)
nRBC: 0 % (ref 0.0–0.2)

## 2022-08-02 LAB — I-STAT VENOUS BLOOD GAS, ED
Acid-Base Excess: 2 mmol/L (ref 0.0–2.0)
Bicarbonate: 28.5 mmol/L — ABNORMAL HIGH (ref 20.0–28.0)
Calcium, Ion: 1.25 mmol/L (ref 1.15–1.40)
HCT: 53 % — ABNORMAL HIGH (ref 39.0–52.0)
Hemoglobin: 18 g/dL — ABNORMAL HIGH (ref 13.0–17.0)
O2 Saturation: 77 %
Patient temperature: 98.6
Potassium: 3.6 mmol/L (ref 3.5–5.1)
Sodium: 135 mmol/L (ref 135–145)
TCO2: 30 mmol/L (ref 22–32)
pCO2, Ven: 47 mmHg (ref 44–60)
pH, Ven: 7.39 (ref 7.25–7.43)
pO2, Ven: 43 mmHg (ref 32–45)

## 2022-08-02 LAB — RESP PANEL BY RT-PCR (RSV, FLU A&B, COVID)  RVPGX2
Influenza A by PCR: NEGATIVE
Influenza B by PCR: NEGATIVE
Resp Syncytial Virus by PCR: NEGATIVE
SARS Coronavirus 2 by RT PCR: NEGATIVE

## 2022-08-02 LAB — URINALYSIS, MICROSCOPIC (REFLEX)

## 2022-08-02 LAB — URINALYSIS, ROUTINE W REFLEX MICROSCOPIC
Glucose, UA: >=500 mg/dL — AB
Ketones, ur: NEGATIVE mg/dL
Leukocytes,Ua: NEGATIVE
Nitrite: NEGATIVE
Protein, ur: 30 mg/dL — AB
Specific Gravity, Urine: >=1.03 (ref 1.005–1.030)
pH: 5 (ref 5.0–8.0)

## 2022-08-02 LAB — HEMOGLOBIN A1C
Hgb A1c MFr Bld: 11.4 % — ABNORMAL HIGH (ref 4.8–5.6)
Mean Plasma Glucose: 280.48 mg/dL

## 2022-08-02 LAB — CBG MONITORING, ED: Glucose-Capillary: 282 mg/dL — ABNORMAL HIGH (ref 70–99)

## 2022-08-02 LAB — LIPASE, BLOOD: Lipase: 33 U/L (ref 11–51)

## 2022-08-02 MED ORDER — SODIUM CHLORIDE 0.9 % IV BOLUS
1000.0000 mL | Freq: Once | INTRAVENOUS | Status: AC
  Administered 2022-08-02: 1000 mL via INTRAVENOUS

## 2022-08-02 MED ORDER — GLIPIZIDE 5 MG PO TABS: 5.0000 mg | ORAL_TABLET | Freq: Every day | ORAL | 0 refills | Status: DC

## 2022-08-02 MED ORDER — HYDROCORTISONE ACETATE 25 MG RE SUPP: 25.0000 mg | Freq: Two times a day (BID) | RECTAL | 0 refills | Status: DC | PRN

## 2022-08-02 NOTE — ED Notes (Signed)
Discharge paperwork reviewed entirely with patient, including Rx's and follow up care. Pain was under control. Pt verbalized understanding as well as all parties involved. No questions or concerns voiced at the time of discharge. No acute distress noted.   Pt ambulated out to PVA without incident or assistance.  

## 2022-08-02 NOTE — Discharge Instructions (Addendum)
Stop the Metformin and start Glucotrol.  Talk to your doctor about a GI referral.

## 2022-08-02 NOTE — ED Provider Notes (Signed)
[facility] [facility] Provider Note   CSN: [id number] Arrival date & time: 08/02/22  1956     History  Chief Complaint  Patient presents with   Fatigue    GRANDVILLE GALEY is a 47 y.o. male.  Pt is a 47 yo male with pmhx significant for dm, htn, and sarcoidosis.  Pt has not been feeling well for the past few days.  He had a fever over the weekend.  He went to UC on 2/11 and had a rapid covid/flu test that were neg.  He has not been compliant with his dm and bp meds.  He said the metformin just causes diarrhea.  Pt said he felt dizzy today and vomited.  He said he just doses not feel well.  He has seen blood on his stool intermittently.  He said the stool is hard.       Home Medications Prior to Admission medications   Medication Sig Start Date End Date Taking? Authorizing Provider  glipiZIDE (GLUCOTROL) 5 MG tablet Take 1 tablet (5 mg total) by mouth daily before breakfast. 08/02/22  Yes Isla Pence, MD  hydrocortisone (ANUSOL-HC) 25 MG suppository Place 1 suppository (25 mg total) rectally 2 (two) times daily as needed (rectal bleeding). 08/02/22  Yes Isla Pence, MD  Diclofenac Sodium CR 100 MG 24 hr tablet Take 1 tablet (100 mg total) by mouth daily. 06/30/19   Palumbo, April, MD  lidocaine (LIDODERM) 5 % Place 1 patch onto the skin daily. Remove & Discard patch within 12 hours or as directed by MD 06/30/19   Randal Buba, April, MD  lisinopril-hydrochlorothiazide (ZESTORETIC) 20-25 MG tablet Take 1 tablet by mouth daily. 02/22/22 03/24/22  Multani, Bhupinder, MD  penicillin v potassium (VEETID) 500 MG tablet TK 1 T PO QID UNTIL GONE 03/29/18   [provider]      Allergies    Patient has no known allergies.    Review of Systems   Review of Systems  Constitutional:  Positive for chills and fever.  Respiratory:  Positive for cough.   Gastrointestinal:  Positive for blood in stool, nausea and vomiting.  Neurological:  Positive for  dizziness and weakness.  All other systems reviewed and are negative.   Physical Exam Updated Vital Signs BP (!) 130/100 (BP Location: Right Arm)   Pulse 99   Temp 98.7 F (37.1 C) (Oral)   Resp 20   Ht 5' 11"$  (1.803 m)   Wt 93.4 kg   SpO2 96%   BMI 28.73 kg/m  Physical Exam Vitals and nursing note reviewed.  Constitutional:      Appearance: Normal appearance.  HENT:     Head: Normocephalic and atraumatic.     Right Ear: External ear normal.     Left Ear: External ear normal.     Nose: Nose normal.     Mouth/Throat:     Mouth: Mucous membranes are dry.  Eyes:     Extraocular Movements: Extraocular movements intact.     Conjunctiva/sclera: Conjunctivae normal.     Pupils: Pupils are equal, round, and reactive to light.  Cardiovascular:     Rate and Rhythm: Normal rate and regular rhythm.     Pulses: Normal pulses.     Heart sounds: Normal heart sounds.  Pulmonary:     Effort: Pulmonary effort is normal.     Breath sounds: Normal breath sounds.  Abdominal:     General: Abdomen is flat. Bowel sounds are normal.  Palpations: Abdomen is soft.  Musculoskeletal:        General: Normal range of motion.     Cervical back: Normal range of motion and neck supple.  Skin:    General: Skin is warm.     Capillary Refill: Capillary refill takes less than 2 seconds.  Neurological:     General: No focal deficit present.     Mental Status: He is alert and oriented to person, place, and time.  Psychiatric:        Mood and Affect: Mood normal.        Behavior: Behavior normal.     ED Results / Procedures / Treatments   Labs (all labs ordered are listed, but only abnormal results are displayed) Labs Reviewed  CBC WITH DIFFERENTIAL/PLATELET - Abnormal; Notable for the following components:      Result Value   RBC 6.47 (*)    MCV 76.5 (*)    MCH 25.3 (*)    All other components within normal limits  COMPREHENSIVE METABOLIC PANEL - Abnormal; Notable for the following  components:   Sodium 131 (*)    Chloride 94 (*)    Glucose, Bld 317 (*)    Creatinine, Ser 1.34 (*)    Total Protein 8.5 (*)    All other components within normal limits  URINALYSIS, ROUTINE W REFLEX MICROSCOPIC - Abnormal; Notable for the following components:   Glucose, UA >=500 (*)    Hgb urine dipstick TRACE (*)    Bilirubin Urine SMALL (*)    Protein, ur 30 (*)    All other components within normal limits  URINALYSIS, MICROSCOPIC (REFLEX) - Abnormal; Notable for the following components:   Bacteria, UA RARE (*)    All other components within normal limits  I-STAT VENOUS BLOOD GAS, ED - Abnormal; Notable for the following components:   Bicarbonate 28.5 (*)    HCT 53.0 (*)    Hemoglobin 18.0 (*)    All other components within normal limits  CBG MONITORING, ED - Abnormal; Notable for the following components:   Glucose-Capillary 282 (*)    All other components within normal limits  RESP PANEL BY RT-PCR (RSV, FLU A&B, COVID)  RVPGX2  LIPASE, BLOOD  HEMOGLOBIN A1C    EKG EKG Interpretation  Date/Time:  Wednesday August 02 2022 20:52:36 EST Ventricular Rate:  85 PR Interval:  145 QRS Duration: 106 QT Interval:  349 QTC Calculation: 415 R Axis:   25 Text Interpretation: Sinus rhythm Nonspecific T abnormalities, inferior leads Borderline ST elevation, anterolateral leads No significant change since last tracing Confirmed by Isla Pence [phone]) on 08/02/2022 8:58:58 PM  Radiology DG Chest 2 View  Result Date: 08/02/2022 CLINICAL DATA:  Dizziness EXAM: CHEST - 2 VIEW COMPARISON:  Chest x-ray 10/21/2019 FINDINGS: The heart size and mediastinal contours are within normal limits. Both lungs are clear. The visualized skeletal structures are unremarkable. IMPRESSION: No active cardiopulmonary disease. Electronically Signed   By: Ronney Asters M.D.   On: 08/02/2022 20:52    Procedures Procedures    Medications Ordered in ED Medications  sodium chloride 0.9 % bolus 1,000  mL (1,000 mLs Intravenous New Bag/Given 08/02/22 2056)    ED Course/ Medical Decision Making/ A&P                             Medical Decision Making Amount and/or Complexity of Data Reviewed Labs: ordered. Radiology: ordered.  Risk Prescription drug management.  This patient presents to the ED for concern of weakness, this involves an extensive number of treatment options, and is a complaint that carries with it a high risk of complications and morbidity.  The differential diagnosis includes hhs, electrolyte abn, infection   Co morbidities that complicate the patient evaluation  Htn and dm2   Additional history obtained:  Additional history obtained from epic chart review External records from outside source obtained and reviewed including wife   Lab Tests:  I Ordered, and personally interpreted labs.  The pertinent results include:  vbg nl, cmp with elevated glucose at 317 and cr 1.34 (chronic), lip 33, cbc nl, covid/flu/rsv neg   Imaging Studies ordered:  I ordered imaging studies including cxr  I independently visualized and interpreted imaging which showed : No active cardiopulmonary disease.  I agree with the radiologist interpretation   Cardiac Monitoring:  The patient was maintained on a cardiac monitor.  I personally viewed and interpreted the cardiac monitored which showed an underlying rhythm of: nsr   Medicines ordered and prescription drug management:  I ordered medication including ivfs  for dehydration  Reevaluation of the patient after these medicines showed that the patient improved I have reviewed the patients home medicines and have made adjustments as needed   Critical Interventions:  ivfs    Problem List / ED Course:  Hyperglycemia:  pt has not been able to tolerate his metformin.  I am going to hold that and have him take glipizide.  Pt is told to f/u with pcp regarding dm. Dizziness:  likely from dehydration from recent fever,  diarrhea from the metformin and elevated blood sugar. Hematochezia:  likely due to constipation/internal hemorrhoid.  Pt given rx for anusol and told to increase fiber/fluids.  He is to f/u with gi.    Reevaluation:  After the interventions noted above, I reevaluated the patient and found that they have :improved   Social Determinants of Health:  Lives at home   Dispostion:  After consideration of the diagnostic results and the patients response to treatment, I feel that the patent would benefit from discharge with outpatient f/u.          Final Clinical Impression(s) / ED Diagnoses Final diagnoses:  Weakness  Dehydration  Poorly controlled type 2 diabetes mellitus (HCC)  Medication nonadherence due to intolerance  Hematochezia    Rx / DC Orders ED Discharge Orders          Ordered    glipiZIDE (GLUCOTROL) 5 MG tablet  Daily before breakfast        08/02/22 2210    hydrocortisone (ANUSOL-HC) 25 MG suppository  2 times daily PRN        08/02/22 2215              Isla Pence, MD 08/02/22 2220

## 2022-08-02 NOTE — ED Triage Notes (Addendum)
Pt c/o general malaise since Thurs; vomited today, felt dizzy earlier; also sts he has had blood in his stool x 1 yr and this has not been evaluated

## 2022-08-02 NOTE — ED Notes (Signed)
Called lab to add on A1c

## 2022-08-02 NOTE — ED Notes (Signed)
Patient transported to X-ray 

## 2022-08-29 ENCOUNTER — Encounter (HOSPITAL_BASED_OUTPATIENT_CLINIC_OR_DEPARTMENT_OTHER): Payer: Self-pay

## 2022-08-29 ENCOUNTER — Other Ambulatory Visit: Payer: Self-pay

## 2022-08-29 ENCOUNTER — Emergency Department (HOSPITAL_BASED_OUTPATIENT_CLINIC_OR_DEPARTMENT_OTHER)
Admission: EM | Admit: 2022-08-29 | Discharge: 2022-08-29 | Disposition: A | Discharge status: Home / Self Care | Payer: BC Managed Care – PPO | Attending: Emergency Medicine | Admitting: Emergency Medicine

## 2022-08-29 DIAGNOSIS — R112 Nausea with vomiting, unspecified: Secondary | ICD-10-CM | POA: Diagnosis not present

## 2022-08-29 DIAGNOSIS — E1165 Type 2 diabetes mellitus with hyperglycemia: Secondary | ICD-10-CM | POA: Diagnosis not present

## 2022-08-29 DIAGNOSIS — R739 Hyperglycemia, unspecified: Secondary | ICD-10-CM

## 2022-08-29 DIAGNOSIS — I10 Essential (primary) hypertension: Secondary | ICD-10-CM | POA: Diagnosis not present

## 2022-08-29 LAB — CBC
HCT: 45.9 % (ref 39.0–52.0)
Hemoglobin: 15.1 g/dL (ref 13.0–17.0)
MCH: 25.2 pg — ABNORMAL LOW (ref 26.0–34.0)
MCHC: 32.9 g/dL (ref 30.0–36.0)
MCV: 76.5 fL — ABNORMAL LOW (ref 80.0–100.0)
Platelets: 277 10*3/uL (ref 150–400)
RBC: 6 MIL/uL — ABNORMAL HIGH (ref 4.22–5.81)
RDW: 12.8 % (ref 11.5–15.5)
WBC: 5.4 10*3/uL (ref 4.0–10.5)
nRBC: 0 % (ref 0.0–0.2)

## 2022-08-29 LAB — URINALYSIS, ROUTINE W REFLEX MICROSCOPIC
Bilirubin Urine: NEGATIVE
Glucose, UA: >=500 mg/dL — AB
Ketones, ur: NEGATIVE mg/dL
Leukocytes,Ua: NEGATIVE
Nitrite: NEGATIVE
Protein, ur: 30 mg/dL — AB
Specific Gravity, Urine: >=1.03 (ref 1.005–1.030)
pH: 5 (ref 5.0–8.0)

## 2022-08-29 LAB — URINALYSIS, MICROSCOPIC (REFLEX): Squamous Epithelial / HPF: NONE SEEN /HPF (ref 0–5)

## 2022-08-29 LAB — BASIC METABOLIC PANEL
Anion gap: 7 (ref 5–15)
BUN: 15 mg/dL (ref 6–20)
CO2: 27 mmol/L (ref 22–32)
Calcium: 9.1 mg/dL (ref 8.9–10.3)
Chloride: 99 mmol/L (ref 98–111)
Creatinine, Ser: 1.18 mg/dL (ref 0.61–1.24)
GFR, Estimated: >60 mL/min (ref >60)
Glucose, Bld: 313 mg/dL — ABNORMAL HIGH (ref 70–99)
Potassium: 3.3 mmol/L — ABNORMAL LOW (ref 3.5–5.1)
Sodium: 133 mmol/L — ABNORMAL LOW (ref 135–145)

## 2022-08-29 LAB — CBG MONITORING, ED
Glucose-Capillary: 342 mg/dL — ABNORMAL HIGH (ref 70–99)
Glucose-Capillary: 244 mg/dL — ABNORMAL HIGH (ref 70–99)

## 2022-08-29 MED ORDER — LACTATED RINGERS IV BOLUS
1000.0000 mL | Freq: Once | INTRAVENOUS | Status: AC
  Administered 2022-08-29: 1000 mL via INTRAVENOUS

## 2022-08-29 MED ORDER — METFORMIN HCL 500 MG PO TABS: 500.0000 mg | ORAL_TABLET | Freq: Two times a day (BID) | ORAL | 0 refills | Status: DC

## 2022-08-29 MED ORDER — GLIPIZIDE 5 MG PO TABS: 5.0000 mg | ORAL_TABLET | Freq: Every day | ORAL | 0 refills | Status: DC

## 2022-08-29 NOTE — ED Triage Notes (Signed)
Hx of type 2 diabetes. States CBG was 310 last night. Emesis x 1 today at work. Feels fatigued. Similar happened last month. Taking glucotrol regularly.

## 2022-08-29 NOTE — ED Notes (Signed)
Pt and spouse verbally understood discharge instructions.

## 2022-08-29 NOTE — Discharge Instructions (Addendum)
Recommend you schedule an appointment outpatient for follow-up with endocrinology.  A prescription for metformin and glipizide has been provided until you can follow-up with endocrinology.

## 2022-08-29 NOTE — ED Provider Notes (Signed)
[facility] [place] Provider Note   CSN: [id number] Arrival date & time: 08/29/22  [id number]     History  Chief Complaint  Patient presents with   Hyperglycemia    Brett Allen is a 47 y.o. male.   Hyperglycemia Associated symptoms: increased thirst, nausea, polyuria and vomiting      47 year old male with medical significant for type 2 diabetes, HTN, sarcoidosis who presents to the emergency department with uncontrolled blood sugars.  The patient states that he has had polyuria and polydipsia for the last week.  He had 1 episode of NBNB emesis earlier today.  His CBG was 310 last night.  He presents to the emergency department due to concern for DKA.  He is on glipizide outpatient which was prescribed by the emergency department.  He has been taking the medication.  He does not follow with a PCP currently because he has not paid a medical bill with his prior PCP office.  He is requesting referral to an endocrinologist.  Denies any abdominal pain, no fevers or chills.  Home Medications Prior to Admission medications   Medication Sig Start Date End Date Taking? Authorizing Provider  metFORMIN (GLUCOPHAGE) 500 MG tablet Take 1 tablet (500 mg total) by mouth 2 (two) times daily with a meal. 08/29/22 09/28/22 Yes Regan Lemming, MD  Diclofenac Sodium CR 100 MG 24 hr tablet Take 1 tablet (100 mg total) by mouth daily. 06/30/19   Palumbo, April, MD  glipiZIDE (GLUCOTROL) 5 MG tablet Take 1 tablet (5 mg total) by mouth daily before breakfast. 08/29/22   Regan Lemming, MD  hydrocortisone (ANUSOL-HC) 25 MG suppository Place 1 suppository (25 mg total) rectally 2 (two) times daily as needed (rectal bleeding). 08/02/22   Isla Pence, MD  lidocaine (LIDODERM) 5 % Place 1 patch onto the skin daily. Remove & Discard patch within 12 hours or as directed by MD 06/30/19   Randal Buba, April, MD  lisinopril-hydrochlorothiazide (ZESTORETIC) 20-25 MG tablet Take 1 tablet  by mouth daily. 02/22/22 03/24/22  Multani, Bhupinder, MD  penicillin v potassium (VEETID) 500 MG tablet TK 1 T PO QID UNTIL GONE 03/29/18   [provider]      Allergies    Patient has no known allergies.    Review of Systems   Review of Systems  Gastrointestinal:  Positive for nausea and vomiting.  Endocrine: Positive for polydipsia and polyuria.  All other systems reviewed and are negative.   Physical Exam Updated Vital Signs BP 116/79 (BP Location: Right Arm)   Pulse 70   Temp 97.7 F (36.5 C) (Oral)   Resp 18   Ht '5\' 11"'$  (1.803 m)   Wt 93 kg   SpO2 100%   BMI 28.59 kg/m  Physical Exam Vitals and nursing note reviewed.  Constitutional:      General: He is not in acute distress.    Appearance: He is well-developed.  HENT:     Head: Normocephalic and atraumatic.  Eyes:     Conjunctiva/sclera: Conjunctivae normal.  Cardiovascular:     Rate and Rhythm: Normal rate and regular rhythm.  Pulmonary:     Effort: Pulmonary effort is normal. No respiratory distress.     Breath sounds: Normal breath sounds.  Abdominal:     Palpations: Abdomen is soft.     Tenderness: There is no abdominal tenderness.  Musculoskeletal:        General: No swelling.     Cervical back: Neck supple.  Skin:    General: Skin is warm and dry.     Capillary Refill: Capillary refill takes less than 2 seconds.  Neurological:     Mental Status: He is alert.  Psychiatric:        Mood and Affect: Mood normal.     ED Results / Procedures / Treatments   Labs (all labs ordered are listed, but only abnormal results are displayed) Labs Reviewed  BASIC METABOLIC PANEL - Abnormal; Notable for the following components:      Result Value   Sodium 133 (*)    Potassium 3.3 (*)    Glucose, Bld 313 (*)    All other components within normal limits  CBC - Abnormal; Notable for the following components:   RBC 6.00 (*)    MCV 76.5 (*)    MCH 25.2 (*)    All other components within normal limits   URINALYSIS, ROUTINE W REFLEX MICROSCOPIC - Abnormal; Notable for the following components:   Glucose, UA >=500 (*)    Hgb urine dipstick TRACE (*)    Protein, ur 30 (*)    All other components within normal limits  URINALYSIS, MICROSCOPIC (REFLEX) - Abnormal; Notable for the following components:   Bacteria, UA RARE (*)    All other components within normal limits  CBG MONITORING, ED - Abnormal; Notable for the following components:   Glucose-Capillary 342 (*)    All other components within normal limits  CBG MONITORING, ED - Abnormal; Notable for the following components:   Glucose-Capillary 244 (*)    All other components within normal limits  CBG MONITORING, ED    EKG None  Radiology No results found.  Procedures Procedures    Medications Ordered in ED Medications  lactated ringers bolus 1,000 mL ( Intravenous Stopped 08/29/22 1154)    ED Course/ Medical Decision Making/ A&P                             Medical Decision Making Amount and/or Complexity of Data Reviewed Labs: ordered.  Risk Prescription drug management.    47 year old male with medical significant for type 2 diabetes, HTN, sarcoidosis who presents to the emergency department with uncontrolled blood sugars.  The patient states that he has had polyuria and polydipsia for the last week.  He had 1 episode of NBNB emesis earlier today.  His CBG was 310 last night.  He presents to the emergency department due to concern for DKA.  He is on glipizide outpatient which was prescribed by the emergency department.  He has been taking the medication.  He does not follow with a PCP currently because he has not paid a medical bill with his prior PCP office.  He is requesting referral to an endocrinologist.  Denies any abdominal pain, no fevers or chills.  On arrival, the patient was afebrile, not tachycardic or tachypneic, BP 124/97, saturating 97% on room air.  Sinus rhythm noted on cardiac telemetry.  Physical exam  generally unremarkable.  Patient presenting with signs and symptoms of hyperglycemia.  Additional differential includes DKA.  Laboratory evaluation significant for urinalysis with glucosuria, negative for UTI, initial CBG was elevated at 342, CBC without a leukocytosis or anemia, BMP with mild hypokalemia to 3.3, no evidence of DKA with a bicarbonate of 27 and anion gap of 7.  Patient was administered an IV fluid bolus for volume resuscitation in the setting of hyperglycemia.  Repeat blood glucose was downtrending  to 244.  The patient was restarted on metformin and his glipizide was reordered.  A referral was placed for outpatient follow-up with an endocrinologist.  Stable for discharge at this time.   Final Clinical Impression(s) / ED Diagnoses Final diagnoses:  Hyperglycemia    Rx / DC Orders ED Discharge Orders          Ordered    Ambulatory referral to Endocrinology        08/29/22 1146    glipiZIDE (GLUCOTROL) 5 MG tablet  Daily before breakfast        08/29/22 1234    metFORMIN (GLUCOPHAGE) 500 MG tablet  2 times daily with meals        08/29/22 1235              Regan Lemming, MD 08/29/22 1419

## 2022-09-15 ENCOUNTER — Other Ambulatory Visit (HOSPITAL_BASED_OUTPATIENT_CLINIC_OR_DEPARTMENT_OTHER): Payer: Self-pay

## 2022-10-09 ENCOUNTER — Other Ambulatory Visit: Payer: Self-pay

## 2022-10-09 ENCOUNTER — Emergency Department (HOSPITAL_BASED_OUTPATIENT_CLINIC_OR_DEPARTMENT_OTHER)
Admission: EM | Admit: 2022-10-09 | Discharge: 2022-10-09 | Disposition: A | Discharge status: Home / Self Care | Payer: Self-pay | Attending: Emergency Medicine | Admitting: Emergency Medicine

## 2022-10-09 ENCOUNTER — Emergency Department (HOSPITAL_BASED_OUTPATIENT_CLINIC_OR_DEPARTMENT_OTHER): Payer: Self-pay

## 2022-10-09 ENCOUNTER — Encounter (HOSPITAL_BASED_OUTPATIENT_CLINIC_OR_DEPARTMENT_OTHER): Payer: Self-pay | Admitting: Urology

## 2022-10-09 DIAGNOSIS — J40 Bronchitis, not specified as acute or chronic: Secondary | ICD-10-CM | POA: Insufficient documentation

## 2022-10-09 DIAGNOSIS — I10 Essential (primary) hypertension: Secondary | ICD-10-CM | POA: Insufficient documentation

## 2022-10-09 DIAGNOSIS — E119 Type 2 diabetes mellitus without complications: Secondary | ICD-10-CM | POA: Insufficient documentation

## 2022-10-09 DIAGNOSIS — Z20822 Contact with and (suspected) exposure to covid-19: Secondary | ICD-10-CM | POA: Insufficient documentation

## 2022-10-09 DIAGNOSIS — Z87891 Personal history of nicotine dependence: Secondary | ICD-10-CM | POA: Insufficient documentation

## 2022-10-09 DIAGNOSIS — R0789 Other chest pain: Secondary | ICD-10-CM | POA: Diagnosis not present

## 2022-10-09 LAB — CBG MONITORING, ED: Glucose-Capillary: 82 mg/dL (ref 70–99)

## 2022-10-09 LAB — RESP PANEL BY RT-PCR (RSV, FLU A&B, COVID)  RVPGX2
Influenza A by PCR: NEGATIVE
Influenza B by PCR: NEGATIVE
Resp Syncytial Virus by PCR: NEGATIVE
SARS Coronavirus 2 by RT PCR: NEGATIVE

## 2022-10-09 MED ORDER — GUAIFENESIN ER 600 MG PO TB12: 600.0000 mg | ORAL_TABLET | Freq: Two times a day (BID) | ORAL | 0 refills | Status: DC | PRN

## 2022-10-09 MED ORDER — DEXAMETHASONE 4 MG PO TABS
8.0000 mg | ORAL_TABLET | Freq: Once | ORAL | Status: AC
  Administered 2022-10-09: 8 mg via ORAL
  Filled 2022-10-09: qty 2

## 2022-10-09 MED ORDER — NAPROXEN 500 MG PO TABS: 500.0000 mg | ORAL_TABLET | Freq: Two times a day (BID) | ORAL | 0 refills | Status: DC

## 2022-10-09 NOTE — Discharge Instructions (Signed)
You were evaluated in the Emergency Department and after careful evaluation, we did not find any emergent condition requiring admission or further testing in the hospital.  Your exam/testing today was overall reassuring.  Symptoms seem to be due to a viral illness causing bronchitis.  Your x-ray did not show any signs of pneumonia.  Take the Naprosyn anti-inflammatory twice daily to help with pain and inflammation.  Take Mucinex to help with congestion.  Please return to the Emergency Department if you experience any worsening of your condition.  Thank you for allowing [place] to be a part of your care.

## 2022-10-09 NOTE — ED Notes (Signed)
Pt. Reports he started with cough on Friday.  Pt. Reports he has a burning feeling in his center chest after coughing.  Pt. In no distress and is asking for a cough med.  Pt. Reports no sleep due to coughing all night while trying to rest.

## 2022-10-09 NOTE — ED Provider Notes (Signed)
[facility] Emergency Department Provider Note MRN:  [id number]  Arrival date & time: 10/09/22     Chief Complaint   Cough   History of Present Illness   Brett Allen is a 47 y.o. year-old male with a history of hypertension, diabetes presenting to the ED with chief complaint of cough.  Cough, body aches, subjective fever for the past few days.  No headache, no shortness of breath.  Burning chest pain that occurs after each cough.  Review of Systems  A thorough review of systems was obtained and all systems are negative except as noted in the HPI and PMH.   Patient's Health History    Past Medical History:  Diagnosis Date   Diabetes mellitus without complication    Hypertension    Sarcoidosis    right eye    Past Surgical History:  Procedure Laterality Date   WRIST FRACTURE SURGERY Left 2017    History reviewed. No pertinent family history.  Social History   Socioeconomic History   Marital status: Married    Spouse name: Not on file   Number of children: Not on file   Years of education: Not on file   Highest education level: Not on file  Occupational History   Not on file  Tobacco Use   Smoking status: Former    Types: Cigarettes   Smokeless tobacco: Never  Vaping Use   Vaping Use: Never used  Substance and Sexual Activity   Alcohol use: Not Currently    Comment: occassionally    Drug use: No   Sexual activity: Not on file  Other Topics Concern   Not on file  Social History Narrative   Not on file   Social Determinants of Health   Financial Resource Strain: Not on file  Food Insecurity: Not on file  Transportation Needs: Not on file  Physical Activity: Not on file  Stress: Not on file  Social Connections: Not on file  Intimate Partner Violence: Not on file     Physical Exam   Vitals:   10/09/22 1846 10/09/22 2259  BP: (!) 150/104 (!) 137/105  Pulse: 81 75  Resp: 18 17  Temp: 98 F (36.7 C)   SpO2: 96%  100%    CONSTITUTIONAL: Well-appearing, NAD NEURO/PSYCH:  Alert and oriented x 3, no focal deficits EYES:  eyes equal and reactive ENT/NECK:  no LAD, no JVD CARDIO: Regular rate, well-perfused, normal S1 and S2 PULM:  CTAB no wheezing or rhonchi GI/GU:  non-distended, non-tender MSK/SPINE:  No gross deformities, no edema SKIN:  no rash, atraumatic   *Additional and/or pertinent findings included in MDM below  Diagnostic and Interventional Summary    EKG Interpretation  Date/Time:    Ventricular Rate:    PR Interval:    QRS Duration:   QT Interval:    QTC Calculation:   R Axis:     Text Interpretation:         Labs Reviewed  RESP PANEL BY RT-PCR (RSV, FLU A&B, COVID)  [id number]  CBG MONITORING, ED    DG Chest 2 View  Final Result      Medications  dexamethasone (DECADRON) tablet 8 mg (8 mg Oral Given 10/09/22 2315)     Procedures  /  Critical Care Procedures  ED Course and Medical Decision Making  Initial Impression and Ddx Suspect viral illness, possibly bronchitis given the persistent cough.  Reassuring vital signs, no increased work of breathing, clear lungs, no meningismus.  Chest pain is burning, very atypical, occurring after each cough and so highly doubt cardiac cause.  Past medical/surgical history that increases complexity of ED encounter: Hypertension, diabetes  Interpretation of Diagnostics I personally reviewed the EKG and my interpretation is as follows: Sinus rhythm without significant change from prior  Chest x-ray normal  Patient Reassessment and Ultimate Disposition/Management     We discussed the pros and cons of Decadron for symptomatic management, especially given his diabetes.  He is desperate for relief.  Will provide single lower dose here in the emergency department and he will keep an eye on his sugars.  Patient management required discussion with the following services or consulting groups:  None  Complexity of Problems  Addressed Acute complicated illness or Injury  Additional Data Reviewed and Analyzed Further history obtained from: None  Additional Factors Impacting ED Encounter Risk Prescriptions  Elmer Sow. Pilar Plate, MD [facility] Emergency Medicine [facility] [email or web address]  Final Clinical Impressions(s) / ED Diagnoses     ICD-10-CM   1. Bronchitis  J40       ED Discharge Orders          Ordered    naproxen (NAPROSYN) 500 MG tablet  2 times daily        10/09/22 2323    guaiFENesin (MUCINEX) 600 MG 12 hr tablet  2 times daily PRN        10/09/22 2323             Discharge Instructions Discussed with and Provided to Patient:    Discharge Instructions      You were evaluated in the Emergency Department and after careful evaluation, we did not find any emergent condition requiring admission or further testing in the hospital.  Your exam/testing today was overall reassuring.  Symptoms seem to be due to a viral illness causing bronchitis.  Your x-ray did not show any signs of pneumonia.  Take the Naprosyn anti-inflammatory twice daily to help with pain and inflammation.  Take Mucinex to help with congestion.  Please return to the Emergency Department if you experience any worsening of your condition.  Thank you for allowing [place] to be a part of your care.       Sabas Sous, MD 10/09/22 [phone]

## 2022-10-09 NOTE — ED Notes (Signed)
Pt discharged to home, NAD noted 

## 2022-10-09 NOTE — ED Triage Notes (Signed)
Pt states has had cough x 3 days and states burning sensation with cough  States hot on Saturday but unknown fever  Reports congestion and runny nose as well

## 2022-10-27 ENCOUNTER — Encounter (HOSPITAL_BASED_OUTPATIENT_CLINIC_OR_DEPARTMENT_OTHER): Payer: Self-pay | Admitting: Emergency Medicine

## 2022-10-27 ENCOUNTER — Inpatient Hospital Stay (HOSPITAL_BASED_OUTPATIENT_CLINIC_OR_DEPARTMENT_OTHER)
Admission: EM | Admit: 2022-10-27 | Discharge: 2022-10-29 | DRG: 603 | Disposition: A | Discharge status: Home / Self Care | Payer: Self-pay | Attending: Internal Medicine | Admitting: Internal Medicine

## 2022-10-27 ENCOUNTER — Emergency Department (HOSPITAL_BASED_OUTPATIENT_CLINIC_OR_DEPARTMENT_OTHER): Payer: Self-pay

## 2022-10-27 ENCOUNTER — Other Ambulatory Visit: Payer: Self-pay

## 2022-10-27 ENCOUNTER — Ambulatory Visit: Payer: Self-pay

## 2022-10-27 DIAGNOSIS — D869 Sarcoidosis, unspecified: Secondary | ICD-10-CM | POA: Diagnosis present

## 2022-10-27 DIAGNOSIS — L03317 Cellulitis of buttock: Principal | ICD-10-CM | POA: Diagnosis present

## 2022-10-27 DIAGNOSIS — E669 Obesity, unspecified: Secondary | ICD-10-CM | POA: Diagnosis present

## 2022-10-27 DIAGNOSIS — E119 Type 2 diabetes mellitus without complications: Secondary | ICD-10-CM

## 2022-10-27 DIAGNOSIS — Z7984 Long term (current) use of oral hypoglycemic drugs: Secondary | ICD-10-CM

## 2022-10-27 DIAGNOSIS — Z6830 Body mass index (BMI) 30.0-30.9, adult: Secondary | ICD-10-CM

## 2022-10-27 DIAGNOSIS — L0390 Cellulitis, unspecified: Secondary | ICD-10-CM | POA: Diagnosis present

## 2022-10-27 DIAGNOSIS — I10 Essential (primary) hypertension: Secondary | ICD-10-CM | POA: Diagnosis present

## 2022-10-27 DIAGNOSIS — Z79899 Other long term (current) drug therapy: Secondary | ICD-10-CM

## 2022-10-27 DIAGNOSIS — E876 Hypokalemia: Secondary | ICD-10-CM | POA: Diagnosis present

## 2022-10-27 DIAGNOSIS — E1165 Type 2 diabetes mellitus with hyperglycemia: Secondary | ICD-10-CM | POA: Diagnosis present

## 2022-10-27 DIAGNOSIS — Z87891 Personal history of nicotine dependence: Secondary | ICD-10-CM

## 2022-10-27 LAB — CBC WITH DIFFERENTIAL/PLATELET
Abs Immature Granulocytes: 0.02 10*3/uL (ref 0.00–0.07)
Basophils Absolute: 0 10*3/uL (ref 0.0–0.1)
Basophils Relative: 1 %
Eosinophils Absolute: 0.1 10*3/uL (ref 0.0–0.5)
Eosinophils Relative: 1 %
HCT: 43.8 % (ref 39.0–52.0)
Hemoglobin: 14 g/dL (ref 13.0–17.0)
Immature Granulocytes: 0 %
Lymphocytes Relative: 26 %
Lymphs Abs: 2.2 10*3/uL (ref 0.7–4.0)
MCH: 25 pg — ABNORMAL LOW (ref 26.0–34.0)
MCHC: 32 g/dL (ref 30.0–36.0)
MCV: 78.4 fL — ABNORMAL LOW (ref 80.0–100.0)
Monocytes Absolute: 0.7 10*3/uL (ref 0.1–1.0)
Monocytes Relative: 9 %
Neutro Abs: 5.5 10*3/uL (ref 1.7–7.7)
Neutrophils Relative %: 63 %
Platelets: 384 10*3/uL (ref 150–400)
RBC: 5.59 MIL/uL (ref 4.22–5.81)
RDW: 13.2 % (ref 11.5–15.5)
WBC: 8.5 10*3/uL (ref 4.0–10.5)
nRBC: 0 % (ref 0.0–0.2)

## 2022-10-27 LAB — BASIC METABOLIC PANEL
Anion gap: 9 (ref 5–15)
BUN: 10 mg/dL (ref 6–20)
CO2: 26 mmol/L (ref 22–32)
Calcium: 8.9 mg/dL (ref 8.9–10.3)
Chloride: 102 mmol/L (ref 98–111)
Creatinine, Ser: 1.04 mg/dL (ref 0.61–1.24)
GFR, Estimated: >60 mL/min (ref >60)
Glucose, Bld: 123 mg/dL — ABNORMAL HIGH (ref 70–99)
Potassium: 3.3 mmol/L — ABNORMAL LOW (ref 3.5–5.1)
Sodium: 137 mmol/L (ref 135–145)

## 2022-10-27 LAB — GLUCOSE, CAPILLARY
Glucose-Capillary: 184 mg/dL — ABNORMAL HIGH (ref 70–99)
Glucose-Capillary: 214 mg/dL — ABNORMAL HIGH (ref 70–99)

## 2022-10-27 LAB — LACTIC ACID, PLASMA: Lactic Acid, Venous: 1.2 mmol/L (ref 0.5–1.9)

## 2022-10-27 LAB — CBG MONITORING, ED: Glucose-Capillary: 169 mg/dL — ABNORMAL HIGH (ref 70–99)

## 2022-10-27 MED ORDER — LISINOPRIL 5 MG PO TABS
5.0000 mg | ORAL_TABLET | Freq: Every day | ORAL | Status: DC
  Administered 2022-10-27 – 2022-10-29 (×3): 5 mg via ORAL
  Filled 2022-10-27 (×3): qty 1

## 2022-10-27 MED ORDER — ACETAMINOPHEN 325 MG PO TABS: 650.0000 mg | ORAL_TABLET | Freq: Four times a day (QID) | ORAL | Status: DC | PRN

## 2022-10-27 MED ORDER — SODIUM CHLORIDE 0.9 % IV SOLN
2.0000 g | Freq: Once | INTRAVENOUS | Status: AC
  Administered 2022-10-27: 2 g via INTRAVENOUS
  Filled 2022-10-27: qty 12.5

## 2022-10-27 MED ORDER — HYDROMORPHONE HCL 1 MG/ML IJ SOLN
1.0000 mg | Freq: Once | INTRAMUSCULAR | Status: AC
  Administered 2022-10-27: 1 mg via INTRAVENOUS
  Filled 2022-10-27: qty 1

## 2022-10-27 MED ORDER — IOHEXOL 300 MG/ML  SOLN
100.0000 mL | Freq: Once | INTRAMUSCULAR | Status: AC | PRN
  Administered 2022-10-27: 100 mL via INTRAVENOUS

## 2022-10-27 MED ORDER — SODIUM CHLORIDE 0.9 % IV SOLN
2.0000 g | INTRAVENOUS | Status: DC
  Administered 2022-10-27 – 2022-10-28 (×2): 2 g via INTRAVENOUS
  Filled 2022-10-27 (×2): qty 20

## 2022-10-27 MED ORDER — INSULIN ASPART 100 UNIT/ML IJ SOLN
0.0000 [IU] | Freq: Every day | INTRAMUSCULAR | Status: DC
  Administered 2022-10-27: 2 [IU] via SUBCUTANEOUS

## 2022-10-27 MED ORDER — POTASSIUM CHLORIDE CRYS ER 20 MEQ PO TBCR
30.0000 meq | EXTENDED_RELEASE_TABLET | ORAL | Status: AC
  Administered 2022-10-27 (×2): 30 meq via ORAL
  Filled 2022-10-27 (×2): qty 1

## 2022-10-27 MED ORDER — SODIUM CHLORIDE 0.9 % IV SOLN: INTRAVENOUS | Status: DC | PRN

## 2022-10-27 MED ORDER — VANCOMYCIN HCL IN DEXTROSE 1-5 GM/200ML-% IV SOLN
1000.0000 mg | Freq: Two times a day (BID) | INTRAVENOUS | Status: DC
  Administered 2022-10-27 – 2022-10-29 (×4): 1000 mg via INTRAVENOUS
  Filled 2022-10-27 (×4): qty 200

## 2022-10-27 MED ORDER — INSULIN ASPART 100 UNIT/ML IJ SOLN
0.0000 [IU] | Freq: Three times a day (TID) | INTRAMUSCULAR | Status: DC
  Administered 2022-10-28: 2 [IU] via SUBCUTANEOUS
  Administered 2022-10-28 – 2022-10-29 (×3): 1 [IU] via SUBCUTANEOUS

## 2022-10-27 MED ORDER — ORAL CARE MOUTH RINSE: 15.0000 mL | OROMUCOSAL | Status: DC | PRN

## 2022-10-27 MED ORDER — ENOXAPARIN SODIUM 40 MG/0.4ML IJ SOSY
40.0000 mg | PREFILLED_SYRINGE | INTRAMUSCULAR | Status: DC
  Administered 2022-10-27 – 2022-10-28 (×2): 40 mg via SUBCUTANEOUS
  Filled 2022-10-27 (×2): qty 0.4

## 2022-10-27 MED ORDER — OXYCODONE HCL 5 MG PO TABS
5.0000 mg | ORAL_TABLET | ORAL | Status: DC | PRN
  Administered 2022-10-27 – 2022-10-28 (×4): 5 mg via ORAL
  Filled 2022-10-27 (×4): qty 1

## 2022-10-27 MED ORDER — VANCOMYCIN HCL IN DEXTROSE 1-5 GM/200ML-% IV SOLN
1000.0000 mg | Freq: Once | INTRAVENOUS | Status: AC
  Administered 2022-10-27: 1000 mg via INTRAVENOUS
  Filled 2022-10-27: qty 200

## 2022-10-27 MED ORDER — POLYETHYLENE GLYCOL 3350 17 G PO PACK: 17.0000 g | PACK | Freq: Every day | ORAL | Status: DC | PRN

## 2022-10-27 MED ORDER — ACETAMINOPHEN 650 MG RE SUPP: 650.0000 mg | Freq: Four times a day (QID) | RECTAL | Status: DC | PRN

## 2022-10-27 MED ORDER — HYDROMORPHONE HCL 1 MG/ML IJ SOLN
0.5000 mg | INTRAMUSCULAR | Status: DC | PRN
  Administered 2022-10-28: 1 mg via INTRAVENOUS
  Filled 2022-10-27: qty 1

## 2022-10-27 MED ORDER — KETOROLAC TROMETHAMINE 15 MG/ML IJ SOLN
15.0000 mg | Freq: Once | INTRAMUSCULAR | Status: AC
  Administered 2022-10-27: 15 mg via INTRAVENOUS
  Filled 2022-10-27: qty 1

## 2022-10-27 NOTE — Progress Notes (Signed)
Pharmacy Antibiotic Note  Brett Allen is a 47 y.o. male admitted on 10/27/2022 with cellulitis.  Pharmacy has been consulted for vancomycin dosing.  Today, 10/27/22 WBC WNL Afebrile  Plan: Vancomycin 1000 mg IV q12h for estimated AUC of 484. Goal AUC 400-550.  Monitor renal function. Check levels at steady state if needed.   Height: 5\' 11"  (180.3 cm) Weight: 97.8 kg (215 lb 9.8 oz) IBW/kg (Calculated) : 75.3  Temp (24hrs), Avg:98.2 F (36.8 C), Min:98.1 F (36.7 C), Max:98.6 F (37 C)  Recent Labs  Lab 10/27/22 0955  WBC 8.5  CREATININE 1.04  LATICACIDVEN 1.2    Estimated Creatinine Clearance: 108.1 mL/min (by C-G formula based on SCr of 1.04 mg/dL).    No Known Allergies  Antimicrobials this admission: vancomycin 5/10 >>  ceftriaxone 5/10 >>   Dose adjustments this admission:  Microbiology results: 5/10 BCx:   Cindi Carbon, PharmD 10/27/2022 6:36 PM

## 2022-10-27 NOTE — Consult Note (Signed)
WOC Nurse Consult Note: Reason for Consult:left buttock with full thickness lesion. Wound type:infectious Pressure Injury POA: N/A Measurement:To be obtained by bedside RN and documented on Nursing Flow Sheet Wound ZOX:[id number] provided by EDP. Red, yellow  Drainage (amount, consistency, odor) small serous Periwound:indurated, erythematous Dressing procedure/placement/frequency: I have provided Nursing with conservative guidance for the topical care of this lesion using a silver hydrofiber topped with dry gauze and secured with foam applied after cleansing with soap and water.  Recommend consultation with Surgery. If you agree, please order;arrange consult.  WOC nursing team will not follow, but will remain available to this patient, the nursing and medical teams.  Please re-consult if needed.  Thank you for inviting [place] to participate in this patient's Plan of Care.  Ladona Mow, MSN, RN, CNS, GNP, Leda Min, [employer], [employer] phone:  [phone]

## 2022-10-27 NOTE — ED Triage Notes (Signed)
Perineal abscess x 6 days , Hx diabetes , drainage noted .

## 2022-10-27 NOTE — H&P (Signed)
History and Physical    Patient: Brett Allen:[id number] DOB: [birth year] DOA: 10/27/2022 DOS: the patient was seen and examined on 10/27/2022 PCP: Pcp, No  Patient coming from: Home  Chief Complaint: Cellulitis Chief Complaint  Patient presents with   Abscess   HPI: Brett Allen is a 47 y.o. male with medical history significant of HTN, non-insulin-dependent diabetes mellitus, obesity who presented to [facility] on 5/10 with progressive swelling, erythema and drainage from his left buttock.  Patient reports initial swelling 6 days prior to admission that has progressed with now purulent drainage.  He attempted Epsom salt bath soaks and warm compresses without improvement.  Denies fever/chills, denies headache, no visual changes, no chest pain, no palpitations, no shortness of breath, no abdominal pain, no focal weakness, no fatigue, no cough/congestion, no paresthesias.  In the ED, temperature 98.2 F, HR 89, RR 18, BP 140/95, SpO2 99% on room air.  WBC 8.5, hemoglobin 14.0, platelets 384.  Sodium 137, potassium 3.3, chloride 102, CO2 26, BUN 10, creatinine 1.04.  Glucose 123.  Lactic acid 1.2.  CT abdomen/pelvis with subcutaneous fat stranding left lower quadrant anterior abdominal wall, left inguinal region, left gluteal region consistent with cellulitis without fluid collection or abscess.  Given progression of symptoms, history of diabetes with apparent poor control, EDP consulted [facility] for admission.  Patient was transferred to [facility] for further evaluation and management of cellulitis.   Review of Systems: As mentioned in the history of present illness. All other systems reviewed and are negative. Past Medical History:  Diagnosis Date   Diabetes mellitus without complication (HCC)    Hypertension    Sarcoidosis    right eye   Past Surgical History:  Procedure Laterality Date   WRIST FRACTURE SURGERY Left 2017   Social History:  reports that he  has quit smoking. His smoking use included cigarettes. He has never used smokeless tobacco. He reports that he does not currently use alcohol. He reports that he does not use drugs.  No Known Allergies  History reviewed. No pertinent family history.  Prior to Admission medications   Medication Sig Start Date End Date Taking? Authorizing Provider  glipiZIDE (GLUCOTROL) 5 MG tablet Take 1 tablet (5 mg total) by mouth daily before breakfast. 08/29/22  Yes Ernie Avena, MD  lisinopril (ZESTRIL) 5 MG tablet Take 5 mg by mouth daily. 05/23/22  Yes [provider]  Diclofenac Sodium CR 100 MG 24 hr tablet Take 1 tablet (100 mg total) by mouth daily. Patient not taking: Reported on 10/27/2022 06/30/19   Palumbo, April, MD  guaiFENesin (MUCINEX) 600 MG 12 hr tablet Take 1 tablet (600 mg total) by mouth 2 (two) times daily as needed. Patient not taking: Reported on 10/27/2022 10/09/22   Sabas Sous, MD  hydrocortisone (ANUSOL-HC) 25 MG suppository Place 1 suppository (25 mg total) rectally 2 (two) times daily as needed (rectal bleeding). Patient not taking: Reported on 10/27/2022 08/02/22   Jacalyn Lefevre, MD  lidocaine (LIDODERM) 5 % Place 1 patch onto the skin daily. Remove & Discard patch within 12 hours or as directed by MD Patient not taking: Reported on 10/27/2022 06/30/19   Palumbo, April, MD  lisinopril-hydrochlorothiazide (ZESTORETIC) 20-25 MG tablet Take 1 tablet by mouth daily. Patient not taking: Reported on 10/27/2022 02/22/22 10/27/22  Reinaldo Raddle, MD  metFORMIN (GLUCOPHAGE) 500 MG tablet Take 1 tablet (500 mg total) by mouth 2 (two) times daily with a meal. Patient not taking: Reported on 10/27/2022  08/29/22 10/27/22  Ernie Avena, MD  naproxen (NAPROSYN) 500 MG tablet Take 1 tablet (500 mg total) by mouth 2 (two) times daily. Patient not taking: Reported on 10/27/2022 10/09/22   Sabas Sous, MD    Physical Exam: Vitals:   10/27/22 1430 10/27/22 1500 10/27/22 1525 10/27/22  1617  BP: 122/78 114/78  (!) 144/95  Pulse: 76   89  Resp: 17   18  Temp:   98.1 F (36.7 C) 98.2 F (36.8 C)  TempSrc:   Oral Oral  SpO2: 98%   99%  Weight:    97.8 kg  Height:    5\' 11"  (1.803 m)    GEN: 47 yo male in NAD, alert and oriented x 3, obese HEENT: NCAT, PERRL, EOMI, sclera clear, MMM PULM: CTAB w/o wheezes/crackles, normal respiratory effort, on room air CV: RRR w/o M/G/R GI: abd soft, NTND, NABS, no R/G/M MSK: no peripheral edema, moves all extremities independently NEURO: No focal deficits noted PSYCH: normal mood/affect Integumentary: Left buttock with induration, erythema with wound roughly 10 x 10 cm with tenderness to palpation.     Assessment and Plan:  Cellulitis, left buttock Patient presenting to ED with 6-week history of progressive pain, swelling, erythema with now a draining small wound to left buttock.  Has not taken antibiotics outpatient.  Patient is afebrile without leukocytosis.  CT abdomen/pelvis with findings consistent with cellulitis without fluid collection or abscess. -- Vancomycin, pharmacy consulted for dosing/monitoring -- Ceftriaxone 2 g IV every 24 hours --Tylenol, oxycodone, Dilaudid as needed pain control -- Repeat CBC in a.m. -- Wound ostomy RN consult  Hypokalemia Potassium 3.3, will replete. -- Repeat electrolytes in a.m. to include magnesium  Type 2 diabetes mellitus On glipizide 5 mg p.o. daily at baseline.  Most recent hemoglobin A1c 11.4 on 08/02/2022, indicating poor control. -- Hold oral hypoglycemics while inpatient -- Update hemoglobin A1c -- Sensitive insulin sliding scale for coverage -- CBG before every meal/at bedtime  Essential hypertension -- Continue lisinopril 5 mg p.o. daily  Obesity Body mass index is 30.07 kg/m.  Discussed with patient needs for aggressive lifestyle changes/weight loss as this complicates all facets of care.  Outpatient follow-up with PCP.  May benefit from bariatric evaluation  outpatient.  Social: Patient currently reports does not have a primary care physician. --TOC consulted for assistance     Advance Care Planning:   Code Status: Full Code   Consults: None  Family Communication: No family present at bedside at time of admission  Severity of Illness: The appropriate patient status for this patient is INPATIENT. Inpatient status is judged to be reasonable and necessary in order to provide the required intensity of service to ensure the patient's safety. The patient's presenting symptoms, physical exam findings, and initial radiographic and laboratory data in the context of their chronic comorbidities is felt to place them at high risk for further clinical deterioration. Furthermore, it is not anticipated that the patient will be medically stable for discharge from the hospital within 2 midnights of admission.   * I certify that at the point of admission it is my clinical judgment that the patient will require inpatient hospital care spanning beyond 2 midnights from the point of admission due to high intensity of service, high risk for further deterioration and high frequency of surveillance required.*  Author: Alvira Philips [place], DO 10/27/2022 5:06 PM  For on call review [email or web address].

## 2022-10-27 NOTE — ED Provider Notes (Signed)
[place] [facility] Provider Note   CSN: [id number] Arrival date & time: 10/27/22  0725     History  Chief Complaint  Patient presents with   Abscess    Brett Allen is a 47 y.o. male.  HPI Patient is a type II diabetic.  He is managed with oral agents glipizide.  Reports he has good glycemic control.  He developed an area of painful swelling on his left buttock about 6 days ago.  He reports that he has been trying to do Epsom salt soaks and warm compresses.  He reports sometimes there is some drainage from the area but it has continued to expand and get exquisitely painful.  Patient denies any prior history of abscess or skin infection.  He denies fever.  He has not had nausea or vomiting.    Home Medications Prior to Admission medications   Medication Sig Start Date End Date Taking? Authorizing Provider  Diclofenac Sodium CR 100 MG 24 hr tablet Take 1 tablet (100 mg total) by mouth daily. 06/30/19   Palumbo, April, MD  glipiZIDE (GLUCOTROL) 5 MG tablet Take 1 tablet (5 mg total) by mouth daily before breakfast. 08/29/22   Ernie Avena, MD  guaiFENesin (MUCINEX) 600 MG 12 hr tablet Take 1 tablet (600 mg total) by mouth 2 (two) times daily as needed. 10/09/22   Sabas Sous, MD  hydrocortisone (ANUSOL-HC) 25 MG suppository Place 1 suppository (25 mg total) rectally 2 (two) times daily as needed (rectal bleeding). 08/02/22   Jacalyn Lefevre, MD  lidocaine (LIDODERM) 5 % Place 1 patch onto the skin daily. Remove & Discard patch within 12 hours or as directed by MD 06/30/19   Nicanor Alcon, April, MD  lisinopril-hydrochlorothiazide (ZESTORETIC) 20-25 MG tablet Take 1 tablet by mouth daily. 02/22/22 03/24/22  Multani, Bhupinder, MD  metFORMIN (GLUCOPHAGE) 500 MG tablet Take 1 tablet (500 mg total) by mouth 2 (two) times daily with a meal. 08/29/22 09/28/22  Ernie Avena, MD  naproxen (NAPROSYN) 500 MG tablet Take 1 tablet (500 mg total) by mouth 2 (two)  times daily. 10/09/22   Sabas Sous, MD  penicillin v potassium (VEETID) 500 MG tablet TK 1 T PO QID UNTIL GONE 03/29/18   [provider]      Allergies    Patient has no known allergies.    Review of Systems   Review of Systems  Physical Exam Updated Vital Signs BP (!) 144/98   Pulse 70   Temp 98.1 F (36.7 C)   Resp 17   Wt 93 kg   SpO2 97%   BMI 28.59 kg/m  Physical Exam Constitutional:      Comments: Is alert nontoxic.  Well-nourished well-developed.  HENT:     Mouth/Throat:     Pharynx: Oropharynx is clear.  Cardiovascular:     Rate and Rhythm: Normal rate and regular rhythm.  Pulmonary:     Effort: Pulmonary effort is normal.     Breath sounds: Normal breath sounds.  Abdominal:     General: There is no distension.     Palpations: Abdomen is soft.     Tenderness: There is no abdominal tenderness. There is no guarding.  Genitourinary:    Comments: Patient is left buttock has an ulcerating, draining nodule.  See attached images.  Associated with this is minimum 10 x 10 cm of deep induration.  He is very tender.  Patient is nontender to palpation deep in the gluteal cleft  around the anus and the perineum.  Has some shoddy inguinal lymphadenopathy. Musculoskeletal:     Comments: Lower extremities normal no peripheral edema.  Calves soft and nontender.  Skin:    General: Skin is warm and dry.  Neurological:     General: No focal deficit present.     Mental Status: He is oriented to person, place, and time.     Motor: No weakness.     Coordination: Coordination normal.  Psychiatric:        Mood and Affect: Mood normal.      ED Results / Procedures / Treatments   Labs (all labs ordered are listed, but only abnormal results are displayed) Labs Reviewed  BASIC METABOLIC PANEL - Abnormal; Notable for the following components:      Result Value   Potassium 3.3 (*)    Glucose, Bld 123 (*)    All other components within normal limits  CBC WITH  DIFFERENTIAL/PLATELET - Abnormal; Notable for the following components:   MCV 78.4 (*)    MCH 25.0 (*)    All other components within normal limits  CULTURE, BLOOD (ROUTINE X 2)  CULTURE, BLOOD (ROUTINE X 2)  LACTIC ACID, PLASMA    EKG None  Radiology CT PELVIS W CONTRAST  Result Date: 10/27/2022 CLINICAL DATA:  Perineal abscess for 6 days, diabetes, drainage from wound site EXAM: CT PELVIS WITH CONTRAST TECHNIQUE: Multidetector CT imaging of the pelvis was performed using the standard protocol following the bolus administration of intravenous contrast. RADIATION DOSE REDUCTION: This exam was performed according to the departmental dose-optimization program which includes automated exposure control, adjustment of the mA and/or kV according to patient size and/or use of iterative reconstruction technique. CONTRAST:  OMNIPAQUE IOHEXOL 300 MG/ML  SOLN COMPARISON:  12/01/2014 FINDINGS: Urinary Tract:  The distal ureters and bladder are unremarkable. Bowel: No bowel obstruction or ileus. Normal appendix right lower quadrant. No bowel wall thickening or inflammatory change. Vascular/Lymphatic: Borderline enlarged lymph nodes are seen within the left inguinal region, measuring up to 12 mm in short axis, likely reactive. No other pathologic adenopathy. No significant vascular findings. Reproductive:  The prostate is grossly unremarkable. Other: There is subcutaneous fat stranding throughout the visualized left gluteal region, compatible with cellulitis. There is also fat stranding within the left lower quadrant anterior abdominal wall extending into the left inguinal region compatible with cellulitis. I do not see any fluid collections or abscess within either region. There is no free intraperitoneal fluid or free gas. No abdominal wall hernia. Musculoskeletal: No acute or destructive bony lesions. Reconstructed images demonstrate no additional findings. IMPRESSION: 1. Subcutaneous fat stranding within  the left lower quadrant anterior abdominal wall, left inguinal region, and throughout the left gluteal region, consistent with cellulitis. No fluid collection or abscess. 2. Reactive adenopathy within the left inguinal region and left hemipelvis. 3. Otherwise no acute intrapelvic process. Electronically Signed   By: Sharlet Salina M.D.   On: 10/27/2022 11:14    Procedures Procedures    Medications Ordered in ED Medications  0.9 %  sodium chloride infusion ( Intravenous New Bag/Given 10/27/22 1000)  vancomycin (VANCOCIN) IVPB 1000 mg/200 mL premix (0 mg Intravenous Stopped 10/27/22 1109)  ceFEPIme (MAXIPIME) 2 g in sodium chloride 0.9 % 100 mL IVPB (2 g Intravenous New Bag/Given 10/27/22 1003)  ketorolac (TORADOL) 15 MG/ML injection 15 mg (15 mg Intravenous Given 10/27/22 1004)  HYDROmorphone (DILAUDID) injection 1 mg (1 mg Intravenous Given 10/27/22 1004)  iohexol (OMNIPAQUE) 300 MG/ML  solution 100 mL (100 mLs Intravenous Contrast Given 10/27/22 1056)    ED Course/ Medical Decision Making/ A&P                             Medical Decision Making Amount and/or Complexity of Data Reviewed Labs: ordered. Radiology: ordered.  Risk Prescription drug management. Decision regarding hospitalization.   Presents as outlined with an area of significant swelling and pain in the buttock.  Comorbid condition of diabetes.  On exam there is very large area of induration.  This is concerning for deeper space infection and possible extension into the pelvic structures.  Cannot appreciate fluctuance.  Will proceed with IV antibiotic therapy and CT scan.  CT scan shows extensive soft tissue stranding but no focal fluid collection.  Identified lymphadenopathy.  I personally examined and reviewed this image.  White count 8.5 lactic 1.2.  At this time, patient has extensive cellulitic changes and a very indurated area of cellulitis that is actively draining a purulent.  With associated diabetes patient has  high risk of rapidly extending infection.  At this time clinical exam and CT do not show Fournier or gangrene.  Plan will be for admission on IV antibiotics and close monitoring.        Final Clinical Impression(s) / ED Diagnoses Final diagnoses:  Cellulitis of buttock  Type 2 diabetes mellitus without complication, without long-term current use of insulin (HCC)    Rx / DC Orders ED Discharge Orders     None         Arby Barrette, MD 10/27/22 1210

## 2022-10-27 NOTE — TOC Progression Note (Signed)
Transition of Care [facility]) - Progression Note    Patient Details  Name: Brett Allen MRN: [id number] Date of Birth: [birth year]  Transition of Care [facility]) CM/SW Contact  Beckie Busing, RN Phone Number:[phone]  10/27/2022, 9:49 PM  Clinical Narrative:    [facility] acknowledges consult for CP needs for uninsured patient. TOC will follow.        Expected Discharge Plan and Services                                               Social Determinants of Health (SDOH) Interventions SDOH Screenings   Food Insecurity: No Food Insecurity (10/27/2022)  Housing: Low Risk  (10/27/2022)  Transportation Needs: No Transportation Needs (10/27/2022)  Utilities: Not At Risk (10/27/2022)  Tobacco Use: Medium Risk (10/27/2022)    Readmission Risk Interventions     No data to display

## 2022-10-27 NOTE — Plan of Care (Signed)
Plan of Care Note for Accepted Transfer   Patient: Brett Allen    ZOX:[id number]     Facility requesting transfer: [facility] Requesting Provider: Dr. Donnald Garre  63M hx DM pw buttock cellulitis. Not septic, got cefepime and vancomycin.  Most recent vitals, labs and radiology:  Blood pressure 126/83, pulse 67, temperature 98.1 F (36.7 C), resp. rate 16, weight 93 kg, SpO2 99 %.      Latest Ref Rng & Units 10/27/2022    9:55 AM 08/29/2022    8:30 AM 08/02/2022    8:57 PM  CBC  WBC 4.0 - 10.5 K/uL 8.5  5.4    Hemoglobin 13.0 - 17.0 g/dL 81.1  91.4  78.2   Hematocrit 39.0 - 52.0 % 43.8  45.9  53.0   Platelets 150 - 400 K/uL 384  277        Latest Ref Rng & Units 10/27/2022    9:55 AM 08/29/2022    8:30 AM 08/02/2022    8:57 PM  BMP  Glucose 70 - 99 mg/dL 956  213    BUN 6 - 20 mg/dL 10  15    Creatinine 0.86 - 1.24 mg/dL 5.78  4.69    Sodium 629 - 145 mmol/L 137  133  135   Potassium 3.5 - 5.1 mmol/L 3.3  3.3  3.6   Chloride 98 - 111 mmol/L 102  99    CO2 22 - 32 mmol/L 26  27    Calcium 8.9 - 10.3 mg/dL 8.9  9.1       CT PELVIS W CONTRAST  Result Date: 10/27/2022 CLINICAL DATA:  Perineal abscess for 6 days, diabetes, drainage from wound site EXAM: CT PELVIS WITH CONTRAST TECHNIQUE: Multidetector CT imaging of the pelvis was performed using the standard protocol following the bolus administration of intravenous contrast. RADIATION DOSE REDUCTION: This exam was performed according to the departmental dose-optimization program which includes automated exposure control, adjustment of the mA and/or kV according to patient size and/or use of iterative reconstruction technique. CONTRAST:  OMNIPAQUE IOHEXOL 300 MG/ML  SOLN COMPARISON:  12/01/2014 FINDINGS: Urinary Tract:  The distal ureters and bladder are unremarkable. Bowel: No bowel obstruction or ileus. Normal appendix right lower quadrant. No bowel wall thickening or inflammatory change. Vascular/Lymphatic: Borderline enlarged  lymph nodes are seen within the left inguinal region, measuring up to 12 mm in short axis, likely reactive. No other pathologic adenopathy. No significant vascular findings. Reproductive:  The prostate is grossly unremarkable. Other: There is subcutaneous fat stranding throughout the visualized left gluteal region, compatible with cellulitis. There is also fat stranding within the left lower quadrant anterior abdominal wall extending into the left inguinal region compatible with cellulitis. I do not see any fluid collections or abscess within either region. There is no free intraperitoneal fluid or free gas. No abdominal wall hernia. Musculoskeletal: No acute or destructive bony lesions. Reconstructed images demonstrate no additional findings. IMPRESSION: 1. Subcutaneous fat stranding within the left lower quadrant anterior abdominal wall, left inguinal region, and throughout the left gluteal region, consistent with cellulitis. No fluid collection or abscess. 2. Reactive adenopathy within the left inguinal region and left hemipelvis. 3. Otherwise no acute intrapelvic process. Electronically Signed   By: Sharlet Salina M.D.   On: 10/27/2022 11:14     The patient has been accepted for transfer to [facility] or [facility], depending on bed and resource availability. The patient will remain under the care and responsibility of the  referring provider until they have arrived to our inpatient facility.  Author: Omega Slager Sharlette Dense, MD  10/27/2022  Check [email or web address] for on-call coverage.  Nursing staff, Please call [facility] Admits & Consults System-Wide number on Amion as soon as patient's arrival, so appropriate admitting provider can evaluate the pt.

## 2022-10-28 LAB — BASIC METABOLIC PANEL
Anion gap: 8 (ref 5–15)
BUN: 8 mg/dL (ref 6–20)
CO2: 23 mmol/L (ref 22–32)
Calcium: 8.5 mg/dL — ABNORMAL LOW (ref 8.9–10.3)
Chloride: 101 mmol/L (ref 98–111)
Creatinine, Ser: 1.11 mg/dL (ref 0.61–1.24)
GFR, Estimated: >60 mL/min (ref >60)
Glucose, Bld: 147 mg/dL — ABNORMAL HIGH (ref 70–99)
Potassium: 3.5 mmol/L (ref 3.5–5.1)
Sodium: 132 mmol/L — ABNORMAL LOW (ref 135–145)

## 2022-10-28 LAB — CBC
HCT: 42.2 % (ref 39.0–52.0)
Hemoglobin: 12.9 g/dL — ABNORMAL LOW (ref 13.0–17.0)
MCH: 24.8 pg — ABNORMAL LOW (ref 26.0–34.0)
MCHC: 30.6 g/dL (ref 30.0–36.0)
MCV: 81 fL (ref 80.0–100.0)
Platelets: 340 10*3/uL (ref 150–400)
RBC: 5.21 MIL/uL (ref 4.22–5.81)
RDW: 13.1 % (ref 11.5–15.5)
WBC: 9 10*3/uL (ref 4.0–10.5)
nRBC: 0 % (ref 0.0–0.2)

## 2022-10-28 LAB — GLUCOSE, CAPILLARY
Glucose-Capillary: 139 mg/dL — ABNORMAL HIGH (ref 70–99)
Glucose-Capillary: 137 mg/dL — ABNORMAL HIGH (ref 70–99)
Glucose-Capillary: 151 mg/dL — ABNORMAL HIGH (ref 70–99)
Glucose-Capillary: 136 mg/dL — ABNORMAL HIGH (ref 70–99)

## 2022-10-28 LAB — HEMOGLOBIN A1C
Hgb A1c MFr Bld: 9.7 % — ABNORMAL HIGH (ref 4.8–5.6)
Mean Plasma Glucose: 231.69 mg/dL

## 2022-10-28 LAB — HIV ANTIBODY (ROUTINE TESTING W REFLEX): HIV Screen 4th Generation wRfx: NONREACTIVE

## 2022-10-28 LAB — MAGNESIUM: Magnesium: 1.6 mg/dL — ABNORMAL LOW (ref 1.7–2.4)

## 2022-10-28 MED ORDER — POTASSIUM CHLORIDE CRYS ER 20 MEQ PO TBCR
40.0000 meq | EXTENDED_RELEASE_TABLET | Freq: Once | ORAL | Status: AC
  Administered 2022-10-28: 40 meq via ORAL
  Filled 2022-10-28: qty 2

## 2022-10-28 MED ORDER — MAGNESIUM SULFATE 2 GM/50ML IV SOLN
2.0000 g | Freq: Once | INTRAVENOUS | Status: AC
  Administered 2022-10-28: 2 g via INTRAVENOUS
  Filled 2022-10-28: qty 50

## 2022-10-28 NOTE — Progress Notes (Signed)
PROGRESS NOTE    LORA KNETTER  ZOX:[id number] DOB: [birth year] DOA: 10/27/2022 PCP: Pcp, No    Brief Narrative:   Brett Allen is a 47 y.o. male with past medical history significant for of HTN, non-insulin-dependent diabetes mellitus, obesity who presented to [facility] on 5/10 with progressive swelling, erythema and drainage from his left buttock.  Patient reports initial swelling 6 days prior to admission that has progressed with now purulent drainage.  He attempted Epsom salt bath soaks and warm compresses without improvement.  Denies fever/chills, denies headache, no visual changes, no chest pain, no palpitations, no shortness of breath, no abdominal pain, no focal weakness, no fatigue, no cough/congestion, no paresthesias.   In the ED, temperature 98.2 F, HR 89, RR 18, BP 140/95, SpO2 99% on room air.  WBC 8.5, hemoglobin 14.0, platelets 384.  Sodium 137, potassium 3.3, chloride 102, CO2 26, BUN 10, creatinine 1.04.  Glucose 123.  Lactic acid 1.2.  CT abdomen/pelvis with subcutaneous fat stranding left lower quadrant anterior abdominal wall, left inguinal region, left gluteal region consistent with cellulitis without fluid collection or abscess.  Given progression of symptoms, history of diabetes with apparent poor control, EDP consulted [facility] for admission.  Patient was transferred to [facility] for further evaluation and management of cellulitis.  Assessment & Plan:   Cellulitis, left buttock Patient presenting to ED with 6-week history of progressive pain, swelling, erythema with now a draining small wound to left buttock.  Has not taken antibiotics outpatient.  Patient is afebrile without leukocytosis.  CT abdomen/pelvis with findings consistent with cellulitis without fluid collection or abscess. -- Vancomycin, pharmacy consulted for dosing/monitoring -- Ceftriaxone 2 g IV every 24 hours --Tylenol, oxycodone, Dilaudid as needed pain control -- Repeat CBC  in a.m. -- Wound ostomy RN consult   Hypokalemia Hypomagnesemia Potassium 3.5, magnesium 1.6 this morning, will replete   Type 2 diabetes mellitus, with hyperglycemia On glipizide 5 mg p.o. daily at baseline.  Most recent hemoglobin A1c 11.4 on 08/02/2022, indicating poor control. -- Hold oral hypoglycemics while inpatient -- Hemoglobin A1c on 10/28/2022, 9.7 -- Sensitive insulin sliding scale for coverage -- CBG before every meal/at bedtime   Essential hypertension -- Continue lisinopril 5 mg p.o. daily   Obesity Body mass index is 30.07 kg/m.  Discussed with patient needs for aggressive lifestyle changes/weight loss as this complicates all facets of care.  Outpatient follow-up with PCP.  May benefit from bariatric evaluation outpatient.   Social: Patient currently reports does not have a primary care physician. --TOC consulted for assistance   DVT prophylaxis: enoxaparin (LOVENOX) injection 40 mg Start: 10/27/22 1800    Code Status: Full Code Family Communication:   Disposition Plan:  Level of care: Med-Surg Status is: Inpatient Remains inpatient appropriate because: IV antibiotics    Consultants:  None  Procedures:  None  Antimicrobials:  Vancomycin 5/10>> Ceftriaxone 5/10>> Cefepime 5/10 - 5/10   Subjective: Patient seen examined bedside, resting comfortably.  Lying in bed.  Continues with some pain/soreness to left gluteal region.  Family present at bedside.  Remains on IV antibiotics.  Remains afebrile without leukocytosis.  No other specific complaints or concerns at this time.  Denies headache, no dizziness, no chest pain, no palpitations, no fever/chills/night sweats, no nausea/vomiting/diarrhea, no focal weakness, no fatigue, no paresthesias.  No acute events overnight per nursing staff.  Objective: Vitals:   10/27/22 2057 10/28/22 0023 10/28/22 0442 10/28/22 1334  BP: (!) 140/91 134/84 (!) 138/93 (!) 139/93  Pulse: 89 89 92 97  Resp: 16 14 16 20    Temp: 98.2 F (36.8 C) 98.1 F (36.7 C) 98.7 F (37.1 C) 98.6 F (37 C)  TempSrc: Oral Oral Oral Oral  SpO2: 99% 99% 99% 97%  Weight:      Height:        Intake/Output Summary (Last 24 hours) at 10/28/2022 1406 Last data filed at 10/28/2022 1019 Gross per 24 hour  Intake 418.47 ml  Output --  Net 418.47 ml   Filed Weights   10/27/22 0743 10/27/22 1617  Weight: 93 kg 97.8 kg    Examination:  Physical Exam: GEN: 47 yo male in NAD, alert and oriented x 3, obese HEENT: NCAT, PERRL, EOMI, sclera clear, MMM PULM: CTAB w/o wheezes/crackles, normal respiratory effort, on room air CV: RRR w/o M/G/R GI: abd soft, NTND, NABS, no R/G/M MSK: no peripheral edema, moves all extremities independently NEURO: No focal deficits noted PSYCH: normal mood/affect Integumentary: Left buttock with induration, erythema with wound roughly 10 x 10 cm with tenderness to palpation.    Data Reviewed: I have personally reviewed following labs and imaging studies  CBC: Recent Labs  Lab 10/27/22 0955 10/28/22 0557  WBC 8.5 9.0  NEUTROABS 5.5  --   HGB 14.0 12.9*  HCT 43.8 42.2  MCV 78.4* 81.0  PLT 384 340   Basic Metabolic Panel: Recent Labs  Lab 10/27/22 0955 10/28/22 0557  NA 137 132*  K 3.3* 3.5  CL 102 101  CO2 26 23  GLUCOSE 123* 147*  BUN 10 8  CREATININE 1.04 1.11  CALCIUM 8.9 8.5*  MG  --  1.6*   GFR: Estimated Creatinine Clearance: 101.3 mL/min (by C-G formula based on SCr of 1.11 mg/dL). Liver Function Tests: No results for input(s): "AST", "ALT", "ALKPHOS", "BILITOT", "PROT", "ALBUMIN" in the last 168 hours. No results for input(s): "LIPASE", "AMYLASE" in the last 168 hours. No results for input(s): "AMMONIA" in the last 168 hours. Coagulation Profile: No results for input(s): "INR", "PROTIME" in the last 168 hours. Cardiac Enzymes: No results for input(s): "CKTOTAL", "CKMB", "CKMBINDEX", "TROPONINI" in the last 168 hours. BNP (last 3 results) No results for  input(s): "PROBNP" in the last 8760 hours. HbA1C: Recent Labs    10/28/22 0557  HGBA1C 9.7*   CBG: Recent Labs  Lab 10/27/22 1540 10/27/22 2103 10/27/22 2234 10/28/22 0757 10/28/22 1155  GLUCAP 169* 184* 214* 139* 137*   Lipid Profile: No results for input(s): "CHOL", "HDL", "LDLCALC", "TRIG", "CHOLHDL", "LDLDIRECT" in the last 72 hours. Thyroid Function Tests: No results for input(s): "TSH", "T4TOTAL", "FREET4", "T3FREE", "THYROIDAB" in the last 72 hours. Anemia Panel: No results for input(s): "VITAMINB12", "FOLATE", "FERRITIN", "TIBC", "IRON", "RETICCTPCT" in the last 72 hours. Sepsis Labs: Recent Labs  Lab 10/27/22 0955  LATICACIDVEN 1.2    No results found for this or any previous visit (from the past 240 hour(s)).       Radiology Studies: CT PELVIS W CONTRAST  Result Date: 10/27/2022 CLINICAL DATA:  Perineal abscess for 6 days, diabetes, drainage from wound site EXAM: CT PELVIS WITH CONTRAST TECHNIQUE: Multidetector CT imaging of the pelvis was performed using the standard protocol following the bolus administration of intravenous contrast. RADIATION DOSE REDUCTION: This exam was performed according to the departmental dose-optimization program which includes automated exposure control, adjustment of the mA and/or kV according to patient size and/or use of iterative reconstruction technique. CONTRAST:  OMNIPAQUE IOHEXOL 300 MG/ML  SOLN COMPARISON:  12/01/2014 FINDINGS: Urinary  Tract:  The distal ureters and bladder are unremarkable. Bowel: No bowel obstruction or ileus. Normal appendix right lower quadrant. No bowel wall thickening or inflammatory change. Vascular/Lymphatic: Borderline enlarged lymph nodes are seen within the left inguinal region, measuring up to 12 mm in short axis, likely reactive. No other pathologic adenopathy. No significant vascular findings. Reproductive:  The prostate is grossly unremarkable. Other: There is subcutaneous fat stranding  throughout the visualized left gluteal region, compatible with cellulitis. There is also fat stranding within the left lower quadrant anterior abdominal wall extending into the left inguinal region compatible with cellulitis. I do not see any fluid collections or abscess within either region. There is no free intraperitoneal fluid or free gas. No abdominal wall hernia. Musculoskeletal: No acute or destructive bony lesions. Reconstructed images demonstrate no additional findings. IMPRESSION: 1. Subcutaneous fat stranding within the left lower quadrant anterior abdominal wall, left inguinal region, and throughout the left gluteal region, consistent with cellulitis. No fluid collection or abscess. 2. Reactive adenopathy within the left inguinal region and left hemipelvis. 3. Otherwise no acute intrapelvic process. Electronically Signed   By: Sharlet Salina M.D.   On: 10/27/2022 11:14        Scheduled Meds:  enoxaparin (LOVENOX) injection  40 mg Subcutaneous Q24H   insulin aspart  0-5 Units Subcutaneous QHS   insulin aspart  0-9 Units Subcutaneous TID WC   lisinopril  5 mg Oral Daily   Continuous Infusions:  sodium chloride Stopped (10/27/22 1149)   cefTRIAXone (ROCEPHIN)  IV Stopped (10/27/22 1837)   magnesium sulfate bolus IVPB 2 g (10/28/22 1327)   vancomycin 1,000 mg (10/28/22 1013)     LOS: 1 day    Time spent: 51 minutes spent on chart review, discussion with nursing staff, consultants, updating family and interview/physical exam; more than 50% of that time was spent in counseling and/or coordination of care.    Alvira Philips [place], DO [facility] Available via Epic secure chat 7am-7pm After these hours, please refer to coverage provider listed on [email or web address] 10/28/2022, 2:06 PM

## 2022-10-29 LAB — GLUCOSE, CAPILLARY
Glucose-Capillary: 142 mg/dL — ABNORMAL HIGH (ref 70–99)
Glucose-Capillary: 151 mg/dL — ABNORMAL HIGH (ref 70–99)

## 2022-10-29 MED ORDER — LINEZOLID 600 MG PO TABS: 600.0000 mg | ORAL_TABLET | Freq: Two times a day (BID) | ORAL | 0 refills | Status: AC

## 2022-10-29 MED ORDER — GLIPIZIDE 5 MG PO TABS: 5.0000 mg | ORAL_TABLET | Freq: Every day | ORAL | 0 refills | Status: DC

## 2022-10-29 MED ORDER — AMOXICILLIN-POT CLAVULANATE 875-125 MG PO TABS: 1.0000 | ORAL_TABLET | Freq: Two times a day (BID) | ORAL | 0 refills | Status: AC

## 2022-10-29 MED ORDER — LISINOPRIL 5 MG PO TABS: 5.0000 mg | ORAL_TABLET | Freq: Every day | ORAL | 0 refills | Status: DC

## 2022-10-29 MED ORDER — TRAMADOL HCL 50 MG PO TABS: 50.0000 mg | ORAL_TABLET | Freq: Four times a day (QID) | ORAL | 0 refills | Status: DC | PRN

## 2022-10-29 NOTE — Discharge Summary (Signed)
Physician Discharge Summary  Brett Allen:[id number] DOB: [birth year] DOA: 10/27/2022  PCP: Oneita Hurt, No  Admit date: 10/27/2022 Discharge date: 10/29/2022  Admitted From: Home Disposition: Home  Recommendations for Outpatient Follow-up:  Follow up with PCP in 1-2 weeks Continue antibiotics with Augmentin and linezolid to complete course for left gluteal cellulitis Refilled home lisinopril and glipizide Continue to encourage strict carbohydrate restricted diet  Home Health: No Equipment/Devices: None  Discharge Condition: Stable CODE STATUS: Full code Diet recommendation: Heart healthy/consistent carbohydrate diet  History of present illness:  Brett Allen is a 47 y.o. male with past medical history significant for of HTN, non-insulin-dependent diabetes mellitus, obesity who presented to [facility] on 5/10 with progressive swelling, erythema and drainage from his left buttock.  Patient reports initial swelling 6 days prior to admission that has progressed with now purulent drainage.  He attempted Epsom salt bath soaks and warm compresses without improvement.  Denies fever/chills, denies headache, no visual changes, no chest pain, no palpitations, no shortness of breath, no abdominal pain, no focal weakness, no fatigue, no cough/congestion, no paresthesias.   In the ED, temperature 98.2 F, HR 89, RR 18, BP 140/95, SpO2 99% on room air.  WBC 8.5, hemoglobin 14.0, platelets 384.  Sodium 137, potassium 3.3, chloride 102, CO2 26, BUN 10, creatinine 1.04.  Glucose 123.  Lactic acid 1.2.  CT abdomen/pelvis with subcutaneous fat stranding left lower quadrant anterior abdominal wall, left inguinal region, left gluteal region consistent with cellulitis without fluid collection or abscess.  Given progression of symptoms, history of diabetes with apparent poor control, EDP consulted [facility] for admission.  Patient was transferred to [facility] for further evaluation and  management of cellulitis.  Hospital course:  Cellulitis, left buttock Patient presenting to ED with 6-week history of progressive pain, swelling, erythema with now a draining small wound to left buttock.  Has not taken antibiotics outpatient.  Patient is afebrile without leukocytosis.  CT abdomen/pelvis with findings consistent with cellulitis without fluid collection or abscess.  Patient was initially started on vancomycin and ceftriaxone with improvement of his symptoms.  He remained afebrile without leukocytosis.  Will transition to Zyvox and Augmentin on discharge to complete antibiotic course.  Outpatient follow-up with PCP.   Hypokalemia Hypomagnesemia Repleted during hospitalization.   Type 2 diabetes mellitus, with hyperglycemia On glipizide 5 mg p.o. daily at baseline.  Most recent hemoglobin A1c 11.4 on 08/02/2022, indicating poor control.  Repeat hemoglobin A1c 9.7 on 10/28/2022, improving.  Patient intolerant to metformin outpatient.  Continues on glipizide 5 mg p.o. daily.  Discussed need for consistent carbohydrate diet, restrictions.  Outpatient follow-up with PCP.   Essential hypertension Continue lisinopril 5 mg p.o. daily   Obesity Body mass index is 30.07 kg/m.  Discussed with patient needs for aggressive lifestyle changes/weight loss as this complicates all facets of care.  Outpatient follow-up with PCP.  May benefit from bariatric evaluation outpatient.   Social: Patient currently reports does not have a primary care physician.  TOC consulted for assistance.    Discharge Diagnoses:  Principal Problem:   Cellulitis    Discharge Instructions  Discharge Instructions     Diet - low sodium heart healthy   Complete by: As directed    Discharge wound care:   Complete by: As directed    Wound care to left buttock full thickness wound: Cleanse with soap and water, rinse and gently pat dry. Cover with silver hydrofiber, top with silicone foam. Change daily and as  needed for soiling   Increase activity slowly   Complete by: As directed       Allergies as of 10/29/2022   No Known Allergies      Medication List     STOP taking these medications    Diclofenac Sodium CR 100 MG 24 hr tablet   guaiFENesin 600 MG 12 hr tablet Commonly known as: Mucinex   hydrocortisone 25 MG suppository Commonly known as: ANUSOL-HC   lidocaine 5 % Commonly known as: Lidoderm   lisinopril-hydrochlorothiazide 20-25 MG tablet Commonly known as: Zestoretic   metFORMIN 500 MG tablet Commonly known as: GLUCOPHAGE   naproxen 500 MG tablet Commonly known as: NAPROSYN       TAKE these medications    amoxicillin-clavulanate 875-125 MG tablet Commonly known as: AUGMENTIN Take 1 tablet by mouth 2 (two) times daily for 12 days.   glipiZIDE 5 MG tablet Commonly known as: GLUCOTROL Take 1 tablet (5 mg total) by mouth daily before breakfast.   linezolid 600 MG tablet Commonly known as: Zyvox Take 1 tablet (600 mg total) by mouth 2 (two) times daily for 12 days.   lisinopril 5 MG tablet Commonly known as: ZESTRIL Take 1 tablet (5 mg total) by mouth daily.   traMADol 50 MG tablet Commonly known as: Ultram Take 1 tablet (50 mg total) by mouth every 6 (six) hours as needed for moderate pain.               Discharge Care Instructions  (From admission, onward)           Start     Ordered   10/29/22 0000  Discharge wound care:       Comments: Wound care to left buttock full thickness wound: Cleanse with soap and water, rinse and gently pat dry. Cover with silver hydrofiber, top with silicone foam. Change daily and as needed for soiling   10/29/22 1011            No Known Allergies  Consultations: None   Procedures/Studies: CT PELVIS W CONTRAST  Result Date: 10/27/2022 CLINICAL DATA:  Perineal abscess for 6 days, diabetes, drainage from wound site EXAM: CT PELVIS WITH CONTRAST TECHNIQUE: Multidetector CT imaging of the pelvis was  performed using the standard protocol following the bolus administration of intravenous contrast. RADIATION DOSE REDUCTION: This exam was performed according to the departmental dose-optimization program which includes automated exposure control, adjustment of the mA and/or kV according to patient size and/or use of iterative reconstruction technique. CONTRAST:  OMNIPAQUE IOHEXOL 300 MG/ML  SOLN COMPARISON:  12/01/2014 FINDINGS: Urinary Tract:  The distal ureters and bladder are unremarkable. Bowel: No bowel obstruction or ileus. Normal appendix right lower quadrant. No bowel wall thickening or inflammatory change. Vascular/Lymphatic: Borderline enlarged lymph nodes are seen within the left inguinal region, measuring up to 12 mm in short axis, likely reactive. No other pathologic adenopathy. No significant vascular findings. Reproductive:  The prostate is grossly unremarkable. Other: There is subcutaneous fat stranding throughout the visualized left gluteal region, compatible with cellulitis. There is also fat stranding within the left lower quadrant anterior abdominal wall extending into the left inguinal region compatible with cellulitis. I do not see any fluid collections or abscess within either region. There is no free intraperitoneal fluid or free gas. No abdominal wall hernia. Musculoskeletal: No acute or destructive bony lesions. Reconstructed images demonstrate no additional findings. IMPRESSION: 1. Subcutaneous fat stranding within the left lower quadrant anterior abdominal wall, left inguinal region, and  throughout the left gluteal region, consistent with cellulitis. No fluid collection or abscess. 2. Reactive adenopathy within the left inguinal region and left hemipelvis. 3. Otherwise no acute intrapelvic process. Electronically Signed   By: Sharlet Salina M.D.   On: 10/27/2022 11:14   DG Chest 2 View  Result Date: 10/09/2022 CLINICAL DATA:  Cough EXAM: CHEST - 2 VIEW COMPARISON:  Chest x-ray  dated August 02, 2022 FINDINGS: The heart size and mediastinal contours are within normal limits. Both lungs are clear. The visualized skeletal structures are unremarkable. IMPRESSION: No active cardiopulmonary disease. Electronically Signed   By: Allegra Lai M.D.   On: 10/09/2022 19:17     Subjective: Patient seen examined bedside, resting comfortably.  Lying in bed.  Spouse present.  Overall pain, soreness to left gluteal region improved.  Ready for discharge home.  Discussed with patient needs better control of his glucose/diabetes is likely contributing factor to his infection.  Discussed return precautions.  No other questions or concerns at this time.  Denies headache, no dizziness, no chest pain, no palpitations, no shortness of breath, no abdominal pain, no fever/chills/night sweats, no nausea cefonicid diarrhea, no focal weakness, no fatigue, no paresthesias.  No acute events overnight per nursing staff.  Discharge Exam: Vitals:   10/28/22 2124 10/29/22 0414  BP: (!) 149/99 (!) 150/92  Pulse: 87 86  Resp: 17 15  Temp: 98.7 F (37.1 C) 100 F (37.8 C)  SpO2: 96% 98%   Vitals:   10/28/22 0442 10/28/22 1334 10/28/22 2124 10/29/22 0414  BP: (!) 138/93 (!) 139/93 (!) 149/99 (!) 150/92  Pulse: 92 97 87 86  Resp: 16 20 17 15   Temp: 98.7 F (37.1 C) 98.6 F (37 C) 98.7 F (37.1 C) 100 F (37.8 C)  TempSrc: Oral Oral Oral Oral  SpO2: 99% 97% 96% 98%  Weight:      Height:        Physical Exam: GEN: NAD, alert and oriented x 3, obese HEENT: NCAT, PERRL, EOMI, sclera clear, MMM PULM: CTAB w/o wheezes/crackles, normal respiratory effort, on room air CV: RRR w/o M/G/R GI: abd soft, NTND, NABS, no R/G/M MSK: no peripheral edema, muscle strength globally intact 5/5 bilateral upper/lower extremities NEURO: CN II-XII intact, no focal deficits, sensation to light touch intact PSYCH: normal mood/affect Integumentary: Left buttock with induration, erythema with wound roughly 10 x  10 cm with tenderness to palpation.     The results of significant diagnostics from this hospitalization (including imaging, microbiology, ancillary and laboratory) are listed below for reference.     Microbiology: Recent Results (from the past 240 hour(s))  Culture, blood (routine x 2)     Status: None (Preliminary result)   Collection Time: 10/27/22  9:57 AM   Specimen: BLOOD  Result Value Ref Range Status   Specimen Description   Final    BLOOD LEFT ANTECUBITAL Performed at [facility], [address] [place], [place] [address]    Special Requests   Final    BOTTLES DRAWN AEROBIC AND ANAEROBIC Blood Culture adequate volume Performed at [facility], [address], [place], [place] [address]    Culture   Final    NO GROWTH 1 DAY Performed at [facility], [address], [place], [place] [address]    Report Status PENDING  Incomplete  Culture, blood (routine x 2)     Status: None (Preliminary result)   Collection Time: 10/27/22 10:11 AM  Specimen: BLOOD  Result Value Ref Range Status   Specimen Description   Final    BLOOD RIGHT ANTECUBITAL Performed at [facility], [address] [place], [place] [address]    Special Requests   Final    BOTTLES DRAWN AEROBIC AND ANAEROBIC Blood Culture results may not be optimal due to an excessive volume of blood received in culture bottles Performed at [facility], [address] [place], [place] [address]    Culture   Final    NO GROWTH 1 DAY Performed at [facility], [address], [place], [place] [address]    Report Status PENDING  Incomplete     Labs: BNP (last 3 results) No results for input(s): "BNP" in the last 8760 hours. Basic Metabolic Panel: Recent Labs  Lab 10/27/22 0955 10/28/22 0557  NA 137 132*  K 3.3* 3.5  CL 102 101  CO2 26 23  GLUCOSE 123* 147*  BUN 10 8  CREATININE 1.04 1.11  CALCIUM 8.9 8.5*  MG  --  1.6*   Liver Function  Tests: No results for input(s): "AST", "ALT", "ALKPHOS", "BILITOT", "PROT", "ALBUMIN" in the last 168 hours. No results for input(s): "LIPASE", "AMYLASE" in the last 168 hours. No results for input(s): "AMMONIA" in the last 168 hours. CBC: Recent Labs  Lab 10/27/22 0955 10/28/22 0557  WBC 8.5 9.0  NEUTROABS 5.5  --   HGB 14.0 12.9*  HCT 43.8 42.2  MCV 78.4* 81.0  PLT 384 340   Cardiac Enzymes: No results for input(s): "CKTOTAL", "CKMB", "CKMBINDEX", "TROPONINI" in the last 168 hours. BNP: Invalid input(s): "POCBNP" CBG: Recent Labs  Lab 10/28/22 0757 10/28/22 1155 10/28/22 1547 10/28/22 2121 10/29/22 0737  GLUCAP 139* 137* 151* 136* 142*   D-Dimer No results for input(s): "DDIMER" in the last 72 hours. Hgb A1c Recent Labs    10/28/22 0557  HGBA1C 9.7*   Lipid Profile No results for input(s): "CHOL", "HDL", "LDLCALC", "TRIG", "CHOLHDL", "LDLDIRECT" in the last 72 hours. Thyroid function studies No results for input(s): "TSH", "T4TOTAL", "T3FREE", "THYROIDAB" in the last 72 hours.  Invalid input(s): "FREET3" Anemia work up No results for input(s): "VITAMINB12", "FOLATE", "FERRITIN", "TIBC", "IRON", "RETICCTPCT" in the last 72 hours. Urinalysis    Component Value Date/Time   COLORURINE YELLOW 08/29/2022 0832   APPEARANCEUR CLEAR 08/29/2022 0832   LABSPEC >=1.030 08/29/2022 0832   PHURINE 5.0 08/29/2022 0832   GLUCOSEU >=500 (A) 08/29/2022 0832   HGBUR TRACE (A) 08/29/2022 0832   BILIRUBINUR NEGATIVE 08/29/2022 0832   KETONESUR NEGATIVE 08/29/2022 0832   PROTEINUR 30 (A) 08/29/2022 0832   UROBILINOGEN 1.0 07/08/2012 1105   NITRITE NEGATIVE 08/29/2022 0832   LEUKOCYTESUR NEGATIVE 08/29/2022 0832   Sepsis Labs Recent Labs  Lab 10/27/22 0955 10/28/22 0557  WBC 8.5 9.0   Microbiology Recent Results (from the past 240 hour(s))  Culture, blood (routine x 2)     Status: None (Preliminary result)   Collection Time: 10/27/22  9:57 AM   Specimen: BLOOD   Result Value Ref Range Status   Specimen Description   Final    BLOOD LEFT ANTECUBITAL Performed at [facility], [address] [place], [place] [address]    Special Requests   Final    BOTTLES DRAWN AEROBIC AND ANAEROBIC Blood Culture adequate volume Performed at [facility], [address] [place], [place] [address]    Culture   Final  NO GROWTH 1 DAY Performed at [facility], [address], [place], [place] [address]    Report Status PENDING  Incomplete  Culture, blood (routine x 2)     Status: None (Preliminary result)   Collection Time: 10/27/22 10:11 AM   Specimen: BLOOD  Result Value Ref Range Status   Specimen Description   Final    BLOOD RIGHT ANTECUBITAL Performed at [facility], [address] [place], [place] [address]    Special Requests   Final    BOTTLES DRAWN AEROBIC AND ANAEROBIC Blood Culture results may not be optimal due to an excessive volume of blood received in culture bottles Performed at [facility], [address] [place], [place] [address]    Culture   Final    NO GROWTH 1 DAY Performed at [facility], [address] [place] [address], [place], [place] [address]    Report Status PENDING  Incomplete     Time coordinating discharge: Over 30 minutes  SIGNED:   Alvira Philips [place], DO  [facility] 10/29/2022, 10:11 AM

## 2022-10-29 NOTE — Plan of Care (Signed)
  Problem: Clinical Measurements: Goal: Ability to maintain clinical measurements within normal limits will improve Outcome: Progressing Goal: Will remain free from infection Outcome: Progressing Goal: Diagnostic test results will improve Outcome: Progressing   Problem: Coping: Goal: Ability to adjust to condition or change in health will improve Outcome: Progressing

## 2022-10-29 NOTE — TOC Initial Note (Signed)
Transition of Care [facility]) - Initial/Assessment Note    Patient Details  Name: Brett Allen MRN: [id number] Date of Birth: [birth year]  Transition [facility]) CM/SW Contact:    Adrian Prows, RN Phone Number: 10/29/2022, 11:46 AM  Clinical Narrative:                 [facility] consult for d/c planning; no insurance or PCP listed; spoke w/ pt and wife in room; pt says he is from home and he plans to return at d.c; he identified POC Derius Latney (wife) [phone]; pt denies IPV, food insecurity, difficulty paying utilities or housing; pt has transportation home; pt agrees to receiving resources for Guide to [facility]; copies or resources given to pt; resources placed in d/c instructions; pt will make appts at agencies of choice; no TOC needs.  Expected Discharge Plan: Home/Self Care Barriers to Discharge: No Barriers Identified   Patient Goals and CMS Choice Patient states their goals for this hospitalization and ongoing recovery are:: home          Expected Discharge Plan and Services   Discharge Planning Services: CM Consult Post Acute Care Choice: NA Living arrangements for the past 2 months: Single Family Home Expected Discharge Date: 10/29/22                                    Prior Living Arrangements/Services Living arrangements for the past 2 months: Single Family Home Lives with:: Spouse Patient language and need for interpreter reviewed:: No Do you feel safe going back to the place where you live?: Yes      Need for Family Participation in Patient Care: Yes (Comment) Care giver support system in place?: Yes (comment) Current home services: Other (comment) (n/a) Criminal Activity/Legal Involvement Pertinent to Current Situation/Hospitalization: No - Comment as needed  Activities of Daily Living Home Assistive Devices/Equipment: None ADL Screening (condition at time of admission) Patient's  cognitive ability adequate to safely complete daily activities?: Yes Is the patient deaf or have difficulty hearing?: No Does the patient have difficulty seeing, even when wearing glasses/contacts?: No Does the patient have difficulty concentrating, remembering, or making decisions?: No Patient able to express need for assistance with ADLs?: Yes Does the patient have difficulty dressing or bathing?: No Independently performs ADLs?: Yes (appropriate for developmental age) Does the patient have difficulty walking or climbing stairs?: No Weakness of Legs: None Weakness of Arms/Hands: None  Permission Sought/Granted Permission sought to share information with : Case Manager Permission granted to share information with : Yes, Verbal Permission Granted  Share Information with NAME: Burnard Bunting, RN, CM     Permission granted to share info w Relationship: Hairl Hainsworth (spouse) [phone]     Emotional Assessment Appearance:: Appears stated age Attitude/Demeanor/Rapport: Gracious Affect (typically observed): Accepting Orientation: : Oriented to Self, Oriented to Place, Oriented to  Time, Oriented to Situation Alcohol / Substance Use: Not Applicable Psych Involvement: No (comment)  Admission diagnosis:  Cellulitis of buttock [L03.317] Cellulitis [L03.90] Type 2 diabetes mellitus without complication, without long-term current use of insulin (HCC) [E11.9] Patient Active Problem List   Diagnosis Date Noted   Cellulitis 10/27/2022   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   Chronic bilateral low back pain without sciatica 07/23/2015   Hyperlipidemia 07/09/2015   PCP:  Pcp, No Pharmacy:   [id number] - [place], [place] -  [address] AT [facility] [address] [place] [address] Phone: [phone] Fax: [phone]  [id number] - [place], [place] - [address] AT [facility] [address] [place] [address] Phone: [phone] Fax: [phone]     Social Determinants of Health (SDOH) Social History: SDOH Screenings   Food Insecurity: No Food Insecurity (10/29/2022)  Housing: Low Risk  (10/29/2022)  Transportation Needs: No Transportation Needs (10/29/2022)  Utilities: Not At Risk (10/29/2022)  Tobacco Use: Medium Risk (10/27/2022)   SDOH Interventions: Food Insecurity Interventions: Inpatient TOC Housing Interventions: Inpatient TOC Transportation Interventions: Inpatient TOC Utilities Interventions: Inpatient TOC   Readmission Risk Interventions     No data to display

## 2022-10-29 NOTE — Progress Notes (Signed)
Patient discharged to home with family, discharge instructions reviewed with patient who verbalized understanding. 

## 2022-10-29 NOTE — Discharge Instructions (Signed)
List of [facility] listed above.

## 2022-11-01 LAB — CULTURE, BLOOD (ROUTINE X 2)
Culture: NO GROWTH
Culture: NO GROWTH
Special Requests: ADEQUATE

## 2022-11-28 ENCOUNTER — Ambulatory Visit: Payer: BC Managed Care – PPO | Admitting: Medical

## 2022-11-28 ENCOUNTER — Encounter: Payer: Self-pay | Admitting: Medical

## 2022-11-28 VITALS — BP 136/80 | HR 76 | Temp 98.5°F | Resp 16 | Ht 70.0 in | Wt 212.0 lb

## 2022-11-28 DIAGNOSIS — D170 Benign lipomatous neoplasm of skin and subcutaneous tissue of head, face and neck: Secondary | ICD-10-CM

## 2022-11-28 DIAGNOSIS — Z7984 Long term (current) use of oral hypoglycemic drugs: Secondary | ICD-10-CM

## 2022-11-28 DIAGNOSIS — E119 Type 2 diabetes mellitus without complications: Secondary | ICD-10-CM

## 2022-11-28 DIAGNOSIS — Z8619 Personal history of other infectious and parasitic diseases: Secondary | ICD-10-CM | POA: Diagnosis not present

## 2022-11-28 DIAGNOSIS — M5442 Lumbago with sciatica, left side: Secondary | ICD-10-CM

## 2022-11-28 DIAGNOSIS — R221 Localized swelling, mass and lump, neck: Secondary | ICD-10-CM

## 2022-11-28 DIAGNOSIS — M5441 Lumbago with sciatica, right side: Secondary | ICD-10-CM

## 2022-11-28 DIAGNOSIS — G8929 Other chronic pain: Secondary | ICD-10-CM

## 2022-11-28 DIAGNOSIS — I10 Essential (primary) hypertension: Secondary | ICD-10-CM

## 2022-11-28 MED ORDER — FAMCICLOVIR 500 MG PO TABS: 500.0000 mg | ORAL_TABLET | Freq: Three times a day (TID) | ORAL | 1 refills | Status: DC

## 2022-11-28 MED ORDER — DOXYCYCLINE HYCLATE 100 MG PO TABS: 100.0000 mg | ORAL_TABLET | Freq: Two times a day (BID) | ORAL | 0 refills | Status: DC

## 2022-11-28 MED ORDER — GABAPENTIN 100 MG PO CAPS: 100.0000 mg | ORAL_CAPSULE | Freq: Three times a day (TID) | ORAL | 0 refills | Status: DC

## 2022-11-28 MED ORDER — LOSARTAN POTASSIUM 25 MG PO TABS: 25.0000 mg | ORAL_TABLET | Freq: Every day | ORAL | 3 refills | Status: DC

## 2022-11-28 NOTE — Patient Instructions (Addendum)
Hypertension: Blood pressure 136/80 on Lisinopril 5mg . Discussed increasing dose or changing medication due to persistent elevated readings. -Discontinue Lisinopril 5mg . -Start Losartan 25mg  daily.  Type 2 Diabetes Mellitus: A1c 9.7 a month ago, but recent self-monitoring blood glucose levels have been around 101-116. Currently on Glipizide 5mg  daily. Discussed potential future switch to Ozempic. -Continue Glipizide 5mg  daily. -Check blood glucose twice daily. -Consider Ozempic in 3 months.  Chronic Low Back Pain: History of degenerative disc disease at L4 and L5. Pain radiates to both legs and sometimes causes numbness in foot. -Start Gabapentin, titrating up from one tablet at night to three times daily over three weeks. -Continue over-the-counter Tylenol 500mg  and Ibuprofen 200mg  every 8 hours.  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 -Prescribe Famvir 500mg  three times daily at first sign of outbreak.  Left Mid Buttocks Skin Infection: Possible early abscess. -Prescribe Doxycycline twice daily for 10 days.  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 Recent growth noted. -Order ultrasound of soft tissue in neck.  Follow-up in 2-3 weeks to reassess blood pressure control and response to new medications.

## 2022-11-28 NOTE — Progress Notes (Signed)
Subjective:    Patient ID: Brett Allen, male    DOB: [birth year], 47 y.o.   MRN: [id number]  HPI  Discussed the use of AI scribe software for clinical note transcription with the patient, who gave verbal consent to proceed.  History of Present Illness   The patient, employed as a Pharmacologist.    Presents with a history of hypertension and diabetes. He reports intermittent exercise due to chronic lower back pain, attributed to degenerative disc disease at the L4 and L5 levels. The patient walks for exercise approximately two days per week and maintains a diet that includes fruits, vegetables, and light snacks. He denies any smoking or alcohol use.  His hypertension was diagnosed approximately four to five years ago, but he only began consistent medication management two years ago with 5mg  of Lisinopril. He reports home blood pressure readings around 140/80. For his diabetes, he was initially on Metformin but switched to Glipizide at the beginning of the year due to gastrointestinal side effects. He reports a downward trend in his blood sugar levels since the switch.  The patient has a history of a broken wrist requiring surgery over eight years ago. He also reports chronic lower back pain for over 20 years, which has been managed with over-the-counter Advil. He received a steroid injection for this pain last year, which provided temporary relief.  He also reports a growth on the right side of his neck, which has doubled in size over the past three years. An ear, nose, and throat specialist previously evaluated this growth and diagnosed it as a lipoma. However, the patient did not undergo a recommended biopsy due to loss of insurance.  Lastly, he reports recurrent cold sores, occurring approximately once or twice a year, and a recent skin infection on his left buttock. The latter was treated with antibiotics, but a new bump has appeared in the same area.           Review of Systems  Constitutional:  Negative for chills, fatigue and fever.  HENT:         See hpi neck exam.  Respiratory:  Negative for chest tightness, shortness of breath and wheezing.   Cardiovascular:  Negative for chest pain and palpitations.  Gastrointestinal:  Negative for abdominal pain and anal bleeding.  Genitourinary:  Negative for dysuria and flank pain.  Musculoskeletal:  Negative for back pain.  Skin:  Negative for rash.       See hpi.  Neurological:  Negative for dizziness, speech difficulty, numbness and headaches.  Psychiatric/Behavioral:  Negative for behavioral problems and confusion.    Past Medical History:  Diagnosis Date   Back pain    lower   Cellulitis    Diabetes mellitus without complication (HCC)    Hypertension    Sarcoidosis    right eye     Social History   Socioeconomic History   Marital status: Married    Spouse name: Not on file   Number of children: Not on file   Years of education: Not on file   Highest education level: Not on file  Occupational History   Not on file  Tobacco Use   Smoking status: Former    Types: Cigarettes   Smokeless tobacco: Never  Vaping Use   Vaping Use: Never used  Substance and Sexual Activity   Alcohol use: Not Currently    Comment: occassionally    Drug use: No   Sexual activity: Yes  Partners: Female    Comment: married 18 years.  Other Topics Concern   Not on file  Social History Narrative   Not on file   Social Determinants of Health   Financial Resource Strain: Not on file  Food Insecurity: No Food Insecurity (10/29/2022)   Hunger Vital Sign    Worried About Running Out of Food in the Last Year: Never true    Ran Out of Food in the Last Year: Never true  Transportation Needs: No Transportation Needs (10/29/2022)   PRAPARE - Administrator, Civil Service (Medical): No    Lack of Transportation (Non-Medical): No  Physical Activity: Not on file  Stress: Not on file   Social Connections: Not on file  Intimate Partner Violence: Not At Risk (10/29/2022)   Humiliation, Afraid, Rape, and Kick questionnaire    Fear of Current or Ex-Partner: No    Emotionally Abused: No    Physically Abused: No    Sexually Abused: No    Past Surgical History:  Procedure Laterality Date   left wrsit surgery Left    WRIST FRACTURE SURGERY Left 2017    Family History  Problem Relation Age of Onset   Hypertension Mother    Diabetes Mother    Hypertension Brother     No Known Allergies  Current Outpatient Medications on File Prior to Visit  Medication Sig Dispense Refill   glipiZIDE (GLUCOTROL) 5 MG tablet Take 1 tablet (5 mg total) by mouth daily before breakfast. 90 tablet 0   traMADol (ULTRAM) 50 MG tablet Take 1 tablet (50 mg total) by mouth every 6 (six) hours as needed for moderate pain. (Patient not taking: Reported on 11/28/2022) 20 tablet 0   No current facility-administered medications on file prior to visit.    BP 136/80 (BP Location: Right Arm, Patient Position: Sitting, Cuff Size: Large)   Pulse 76   Temp 98.5 F (36.9 C) (Oral)   Resp 16   Ht 5\' 10"  (1.778 m)   Wt 212 lb (96.2 kg)   SpO2 99%   BMI 30.42 kg/m         Objective:   Physical Exam  General Mental Status- Alert. General Appearance- Not in acute distress.   Skin General: Color- Normal Color. Moisture- Normal Moisture.  Neck Carotid Arteries- Normal color. Moisture- Normal Moisture. No carotid bruits. No JVD.  Chest and Lung Exam Auscultation: Breath Sounds:-Normal.  Cardiovascular Auscultation:Rythm- Regular. Murmurs & Other Heart Sounds:Auscultation of the heart reveals- No Murmurs.  Abdomen Inspection:-Inspeection Normal. Palpation/Percussion:Note:No mass. Palpation and Percussion of the abdomen reveal- Non Tender, Non Distended + BS, no rebound or guarding.    Neurologic Cranial Nerve exam:- CN III-XII intact(No nystagmus), symmetric smile. Drift Test:- No  drift. Romberg Exam:- Negative.  Heal to Toe Gait exam:-Normal. Finger to Nose:- Normal/Intact Strength:- 5/5 equal and symmetric strength both upper and lower extremities.   Neck-rt side base of neck mass that appears size/shape of half 6 cm-8 cm in diameter.  Skin- left mid buttox. Small undurated/mild tender area middle of left buttuck. No redness or warmth.     Assessment & Plan:   Assessment and Plan    Hypertension: Blood pressure 136/80 on Lisinopril 5mg . Discussed increasing dose or changing medication due to persistent elevated readings. -Discontinue Lisinopril 5mg . -Start Losartan 25mg  daily.  Type 2 Diabetes Mellitus: A1c 9.7 a month ago, but recent self-monitoring blood glucose levels have been around 101-116. Currently on Glipizide 5mg  daily. Discussed potential future switch to  Ozempic. -Continue Glipizide 5mg  daily. -Check blood glucose twice daily. -Consider Ozempic in 3 months.  Chronic Low Back Pain: History of degenerative disc disease at L4 and L5. Pain radiates to both legs and sometimes causes numbness in foot. -Start Gabapentin, titrating up from one tablet at night to three times daily over three weeks. -Continue over-the-counter Tylenol 500mg  and Ibuprofen 200mg  every 8 hours.  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 -Prescribe Famvir 500mg  three times daily at first sign of outbreak.  Left Mid Buttocks Skin Infection: Possible early abscess. -Prescribe Doxycycline twice daily for 10 days.  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 Recent growth noted. -Order ultrasound of soft tissue in neck.  Follow-up in 2-3 weeks to reassess blood pressure control and response to new medications.        Esperanza Richters, PA-C   Time spent with patient today was  46 minutes which consisted of chart review, discussing diagnoses chronic and aute, work up ,treatment and documentation.

## 2022-11-29 ENCOUNTER — Ambulatory Visit (HOSPITAL_BASED_OUTPATIENT_CLINIC_OR_DEPARTMENT_OTHER): Payer: BC Managed Care – PPO

## 2022-12-01 ENCOUNTER — Ambulatory Visit (HOSPITAL_BASED_OUTPATIENT_CLINIC_OR_DEPARTMENT_OTHER)
Admission: RE | Admit: 2022-12-01 | Discharge: 2022-12-01 | Disposition: A | Discharge status: Home / Self Care | Payer: BC Managed Care – PPO | Source: Ambulatory Visit | Attending: Medical | Admitting: Medical

## 2022-12-01 DIAGNOSIS — R221 Localized swelling, mass and lump, neck: Secondary | ICD-10-CM

## 2022-12-02 NOTE — Addendum Note (Signed)
Addended by: Gwenevere Abbot on: 12/02/2022 08:04 AM   Modules accepted: Orders

## 2022-12-12 ENCOUNTER — Encounter: Payer: Self-pay | Admitting: Medical

## 2022-12-12 ENCOUNTER — Ambulatory Visit: Payer: BC Managed Care – PPO | Admitting: Medical

## 2022-12-12 VITALS — BP 140/89 | HR 84 | Resp 18 | Ht 70.0 in | Wt 218.4 lb

## 2022-12-12 DIAGNOSIS — R221 Localized swelling, mass and lump, neck: Secondary | ICD-10-CM

## 2022-12-12 DIAGNOSIS — D170 Benign lipomatous neoplasm of skin and subcutaneous tissue of head, face and neck: Secondary | ICD-10-CM | POA: Diagnosis not present

## 2022-12-12 DIAGNOSIS — I10 Essential (primary) hypertension: Secondary | ICD-10-CM

## 2022-12-12 DIAGNOSIS — M5442 Lumbago with sciatica, left side: Secondary | ICD-10-CM

## 2022-12-12 DIAGNOSIS — M5441 Lumbago with sciatica, right side: Secondary | ICD-10-CM

## 2022-12-12 DIAGNOSIS — G8929 Other chronic pain: Secondary | ICD-10-CM

## 2022-12-12 DIAGNOSIS — E119 Type 2 diabetes mellitus without complications: Secondary | ICD-10-CM | POA: Diagnosis not present

## 2022-12-12 DIAGNOSIS — Z7984 Long term (current) use of oral hypoglycemic drugs: Secondary | ICD-10-CM

## 2022-12-12 MED ORDER — TOBRAMYCIN 0.3 % OP SOLN: 2.0000 [drp] | Freq: Four times a day (QID) | OPHTHALMIC | 0 refills | Status: DC

## 2022-12-12 NOTE — Addendum Note (Signed)
Addended by: Gwenevere Abbot on: 12/12/2022 04:06 PM   Modules accepted: Orders

## 2022-12-12 NOTE — Addendum Note (Signed)
Addended by: Gwenevere Abbot on: 12/12/2022 03:40 PM   Modules accepted: Orders, Level of Service

## 2022-12-12 NOTE — Progress Notes (Signed)
Subjective:    Patient ID: Brett Allen, male    DOB: 02-17-1976, 47 y.o.   MRN: [id number]  HPI Last visit A/P portions review today.  "Hypertension: Blood pressure 136/80 on Lisinopril 5mg . Discussed increasing dose or changing medication due to persistent elevated readings. -Discontinue Lisinopril 5mg . -Start Losartan 25mg  daily.   Type 2 Diabetes Mellitus: A1c 9.7 a month ago, but recent self-monitoring blood glucose levels have been around 101-116. Currently on Glipizide 5mg  daily. Discussed potential future switch to Ozempic. -Continue Glipizide 5mg  daily. -Check blood glucose twice daily. -Consider Ozempic in 3 months.  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 Recent growth noted. -Order ultrasound of soft tissue in neck."  Pt states when he check 140/90 or less since started losartan 25 mg daily.  Sugars when checked still in low 100-120. Pt uses care touch strips.   Neck mass [place] results showed.  There is a 6.5 x 3.6 x 8.5 cm soft tissue mass with similar echogenicity as surrounding tissue and echogenic linear striated architecture. Minimal internal vascularity may be present. The sonographic features are most suggestive of lipoma. If there is continued growth or other clinical concern. Further evaluation with MRI is advised.  I placed referral to ENT. Pt states did not get called.   [facility] ENT and Audiology - [facility] [address] [place], [place] [address] [phone]   Review of Systems  Constitutional:  Negative for chills, fatigue and fever.  Respiratory:  Negative for cough, chest tightness, shortness of breath and wheezing.   Cardiovascular:  Negative for chest pain and palpitations.  Gastrointestinal:  Negative for abdominal pain, anal bleeding and constipation.  Genitourinary:  Negative for dysuria and frequency.  Musculoskeletal:  Negative for back pain, myalgias and neck pain.       See hpi on  neck.  Neurological:  Negative for dizziness, seizures, syncope and light-headedness.  Hematological:  Negative for adenopathy. Does not bruise/bleed easily.  Psychiatric/Behavioral:  Negative for behavioral problems, confusion and decreased concentration.     Past Medical History:  Diagnosis Date   Back pain    lower   Cellulitis    Diabetes mellitus without complication (HCC)    Hypertension    Sarcoidosis    right eye     Social History   Socioeconomic History   Marital status: Married    Spouse name: Not on file   Number of children: Not on file   Years of education: Not on file   Highest education level: Not on file  Occupational History   Not on file  Tobacco Use   Smoking status: Former    Types: Cigarettes   Smokeless tobacco: Never  Vaping Use   Vaping Use: Never used  Substance and Sexual Activity   Alcohol use: Not Currently    Comment: occassionally    Drug use: No   Sexual activity: Yes    Partners: Female    Comment: married 18 years.  Other Topics Concern   Not on file  Social History Narrative   Not on file   Social Determinants of Health   Financial Resource Strain: Not on file  Food Insecurity: No Food Insecurity (10/29/2022)   Hunger Vital Sign    Worried About Running Out of Food in the Last Year: Never true    Ran Out of Food in the Last Year: Never true  Transportation Needs: No Transportation Needs (10/29/2022)   PRAPARE -  Administrator, Civil Service (Medical): No    Lack of Transportation (Non-Medical): No  Physical Activity: Not on file  Stress: Not on file  Social Connections: Not on file  Intimate Partner Violence: Not At Risk (10/29/2022)   Humiliation, Afraid, Rape, and Kick questionnaire    Fear of Current or Ex-Partner: No    Emotionally Abused: No    Physically Abused: No    Sexually Abused: No    Past Surgical History:  Procedure Laterality Date   left wrsit surgery Left    WRIST FRACTURE SURGERY Left  2017    Family History  Problem Relation Age of Onset   Hypertension Mother    Diabetes Mother    Hypertension Brother     No Known Allergies  Current Outpatient Medications on File Prior to Visit  Medication Sig Dispense Refill   doxycycline (VIBRA-TABS) 100 MG tablet Take 1 tablet (100 mg total) by mouth 2 (two) times daily. 20 tablet 0   famciclovir (FAMVIR) 500 MG tablet Take 1 tablet (500 mg total) by mouth 3 (three) times daily. 21 tablet 1   gabapentin (NEURONTIN) 100 MG capsule Take 1 capsule (100 mg total) by mouth 3 (three) times daily. 90 capsule 0   glipiZIDE (GLUCOTROL) 5 MG tablet Take 1 tablet (5 mg total) by mouth daily before breakfast. 90 tablet 0   losartan (COZAAR) 25 MG tablet Take 1 tablet (25 mg total) by mouth daily. 30 tablet 3   traMADol (ULTRAM) 50 MG tablet Take 1 tablet (50 mg total) by mouth every 6 (six) hours as needed for moderate pain. (Patient not taking: Reported on 11/28/2022) 20 tablet 0   No current facility-administered medications on file prior to visit.    BP (!) 140/89   Pulse 84   Resp 18   Ht 5\' 10"  (1.778 m)   Wt 218 lb 6.4 oz (99.1 kg)   SpO2 100%   BMI 31.34 kg/m         Objective:   Physical Exam  Expand All Collapse All    Subjective:      Subjective  Patient ID: Brett Allen, male    DOB: [birth year], 47 y.o.   MRN: [id number]   HPI   Discussed the use of AI scribe software for clinical note transcription with the patient, who gave verbal consent to proceed.   History of Present Illness   The patient, employed as a Pharmacologist.      Presents with a history of hypertension and diabetes. He reports intermittent exercise due to chronic lower back pain, attributed to degenerative disc disease at the L4 and L5 levels. The patient walks for exercise approximately two days per week and maintains a diet that includes fruits, vegetables, and light snacks. He denies any smoking or alcohol use.   His  hypertension was diagnosed approximately four to five years ago, but he only began consistent medication management two years ago with 5mg  of Lisinopril. He reports home blood pressure readings around 140/80. For his diabetes, he was initially on Metformin but switched to Glipizide at the beginning of the year due to gastrointestinal side effects. He reports a downward trend in his blood sugar levels since the switch.   The patient has a history of a broken wrist requiring surgery over eight years ago. He also reports chronic lower back pain for over 20 years, which has been managed with over-the-counter Advil. He received a steroid injection for this  pain last year, which provided temporary relief.   He also reports a growth on the right side of his neck, which has doubled in size over the past three years. An ear, nose, and throat specialist previously evaluated this growth and diagnosed it as a lipoma. However, the patient did not undergo a recommended biopsy due to loss of insurance.   Lastly, he reports recurrent cold sores, occurring approximately once or twice a year, and a recent skin infection on his left buttock. The latter was treated with antibiotics, but a new bump has appeared in the same area.              Review of Systems  Constitutional:  Negative for chills, fatigue and fever.  HENT:         See hpi neck exam.  Respiratory:  Negative for chest tightness, shortness of breath and wheezing.   Cardiovascular:  Negative for chest pain and palpitations.  Gastrointestinal:  Negative for abdominal pain and anal bleeding.  Genitourinary:  Negative for dysuria and flank pain.  Musculoskeletal:  Negative for back pain.  Skin:  Negative for rash.       See hpi.  Neurological:  Negative for dizziness, speech difficulty, numbness and headaches.  Psychiatric/Behavioral:  Negative for behavioral problems and confusion.         Past Medical History:  Diagnosis Date   Back pain       lower   Cellulitis     Diabetes mellitus without complication (HCC)     Hypertension     Sarcoidosis      right eye      Social History         Socioeconomic History   Marital status: Married      Spouse name: Not on file   Number of children: Not on file   Years of education: Not on file   Highest education level: Not on file  Occupational History   Not on file  Tobacco Use   Smoking status: Former      Types: Cigarettes   Smokeless tobacco: Never  Vaping Use   Vaping Use: Never used  Substance and Sexual Activity   Alcohol use: Not Currently      Comment: occassionally    Drug use: No   Sexual activity: Yes      Partners: Female      Comment: married 18 years.  Other Topics Concern   Not on file  Social History Narrative   Not on file    Social Determinants of Health        Financial Resource Strain: Not on file  Food Insecurity: No Food Insecurity (10/29/2022)    Hunger Vital Sign     Worried About Running Out of Food in the Last Year: Never true     Ran Out of Food in the Last Year: Never true  Transportation Needs: No Transportation Needs (10/29/2022)    PRAPARE - Therapist, art (Medical): No     Lack of Transportation (Non-Medical): No  Physical Activity: Not on file  Stress: Not on file  Social Connections: Not on file  Intimate Partner Violence: Not At Risk (10/29/2022)    Humiliation, Afraid, Rape, and Kick questionnaire     Fear of Current or Ex-Partner: No     Emotionally Abused: No     Physically Abused: No     Sexually Abused: No  Past Surgical History:  Procedure Laterality Date   left wrsit surgery Left     WRIST FRACTURE SURGERY Left 2017           Family History  Problem Relation Age of Onset   Hypertension Mother     Diabetes Mother     Hypertension Brother        No Known Allergies         Current Outpatient Medications on File Prior to Visit  Medication Sig Dispense Refill   glipiZIDE  (GLUCOTROL) 5 MG tablet Take 1 tablet (5 mg total) by mouth daily before breakfast. 90 tablet 0   traMADol (ULTRAM) 50 MG tablet Take 1 tablet (50 mg total) by mouth every 6 (six) hours as needed for moderate pain. (Patient not taking: Reported on 11/28/2022) 20 tablet 0    No current facility-administered medications on file prior to visit.      BP 136/80 (BP Location: Right Arm, Patient Position: Sitting, Cuff Size: Large)   Pulse 76   Temp 98.5 F (36.9 C) (Oral)   Resp 16   Ht 5\' 10"  (1.778 m)   Wt 212 lb (96.2 kg)   SpO2 99%   BMI 30.42 kg/m            Objective:    Objective [] Expand by Default Physical Exam   General Mental Status- Alert. General Appearance- Not in acute distress.    Skin General: Color- Normal Color. Moisture- Normal Moisture.   Neck Carotid Arteries- Normal color. Moisture- Normal Moisture. No carotid bruits. No JVD.   Chest and Lung Exam Auscultation: Breath Sounds:-Normal.   Cardiovascular Auscultation:Rythm- Regular. Murmurs & Other Heart Sounds:Auscultation of the heart reveals- No Murmurs.   Abdomen Inspection:-Inspeection Normal. Palpation/Percussion:Note:No mass. Palpation and Percussion of the abdomen reveal- Non Tender, Non Distended + BS, no rebound or guarding.       Neurologic Cranial Nerve exam:- CN III-XII intact(No nystagmus), symmetric smile. Drift Test:- No drift. Romberg Exam:- Negative.  Heal to Toe Gait exam:-Normal. Finger to Nose:- Normal/Intact Strength:- 5/5 equal and symmetric strength both upper and lower extremities.    Neck-rt side base of neck mass that appears size/shape of half 6 cm-8 cm in diameter.     Skin- left buttuck area flattended out and no infection per pt.      Assessment & Plan:   Patient Instructions  1. Hypertension, unspecified type Bp borderline. Increase losartan to 50 mg daily. Rx sent.  2. Controlled type 2 diabetes mellitus without complication, without long-term current  use of insulin (HCC) -Continue Glipizide 5mg  daily. -Check blood glucose twice daily. - ask staff to send in strips to check sugars.  3. Neck mass( Lipoma of neck). Reviewed findings on [place]. Please call ENT office. [facility] ENT and Audiology - [facility] [address] [place], [place] [address] [phone]   4 Chronic Low Back Pain: History of degenerative disc disease at L4 and L5. Pain radiates to both legs and sometimes causes numbness in foot.  -refer to sports med. [place] location noted on referral.  Follow  up August 16 th or sooner if needed.but ask you give me my chart update on bp readings in 10 days.     Esperanza Richters, PA-C

## 2022-12-12 NOTE — Progress Notes (Deleted)
   Subjective:    Patient ID: Brett Allen, male    DOB: 23-Sep-1975, 47 y.o.   MRN: [id number]  HPI     Review of Systems    Objective:   Physical Exam Assessment & Plan:

## 2022-12-12 NOTE — Patient Instructions (Addendum)
1. Hypertension, unspecified type Bp borderline. Increase losartan to 50 mg daily. Rx sent.  2. Controlled type 2 diabetes mellitus without complication, without long-term current use of insulin (HCC) -Continue Glipizide 5mg  daily. -Check blood glucose twice daily. - ask staff to send in strips to check sugars.  3. Neck mass( Lipoma of neck). Reviewed findings on [place]. Please call ENT office. [facility] ENT and Audiology - [facility] [address] [place], [place] [address] [phone]   4 Chronic Low Back Pain: History of degenerative disc disease at L4 and L5. Pain radiates to both legs and sometimes causes numbness in foot.  -refer to sports med. [place] location noted on referral.  Follow  up August 16 th or sooner if needed.but ask you give me my chart update on bp readings in 10 days.

## 2022-12-19 ENCOUNTER — Encounter: Payer: Self-pay | Admitting: Medical

## 2022-12-19 MED ORDER — BLOOD GLUCOSE TEST VI STRP: 1.0000 | ORAL_STRIP | Freq: Three times a day (TID) | 0 refills | Status: AC

## 2022-12-19 MED ORDER — LANCETS MISC. MISC: 1.0000 | Freq: Three times a day (TID) | 0 refills | Status: AC

## 2022-12-19 MED ORDER — LANCET DEVICE MISC: 1.0000 | Freq: Three times a day (TID) | 0 refills | Status: AC

## 2022-12-19 MED ORDER — BLOOD GLUCOSE MONITORING SUPPL DEVI: 1.0000 | Freq: Three times a day (TID) | 0 refills | Status: DC

## 2022-12-19 MED ORDER — LOSARTAN POTASSIUM 25 MG PO TABS: 25.0000 mg | ORAL_TABLET | Freq: Every day | ORAL | 3 refills | Status: DC

## 2022-12-19 NOTE — Addendum Note (Signed)
Addended by: Maximino Sarin on: 12/19/2022 02:30 PM   Modules accepted: Orders

## 2022-12-28 ENCOUNTER — Other Ambulatory Visit: Payer: Self-pay | Admitting: Medical

## 2023-01-01 ENCOUNTER — Ambulatory Visit: Payer: BC Managed Care – PPO | Admitting: Family Medicine

## 2023-01-17 ENCOUNTER — Encounter (INDEPENDENT_AMBULATORY_CARE_PROVIDER_SITE_OTHER): Payer: Self-pay

## 2023-02-02 ENCOUNTER — Telehealth: Payer: Self-pay | Admitting: Medical

## 2023-02-02 ENCOUNTER — Ambulatory Visit: Payer: BC Managed Care – PPO | Admitting: Medical

## 2023-02-02 ENCOUNTER — Encounter: Payer: Self-pay | Admitting: Medical

## 2023-02-02 ENCOUNTER — Other Ambulatory Visit (HOSPITAL_BASED_OUTPATIENT_CLINIC_OR_DEPARTMENT_OTHER): Payer: Self-pay

## 2023-02-02 VITALS — BP 140/88 | HR 102 | Ht 70.0 in | Wt 220.6 lb

## 2023-02-02 DIAGNOSIS — E119 Type 2 diabetes mellitus without complications: Secondary | ICD-10-CM

## 2023-02-02 DIAGNOSIS — Z7984 Long term (current) use of oral hypoglycemic drugs: Secondary | ICD-10-CM

## 2023-02-02 DIAGNOSIS — M5441 Lumbago with sciatica, right side: Secondary | ICD-10-CM

## 2023-02-02 DIAGNOSIS — M5442 Lumbago with sciatica, left side: Secondary | ICD-10-CM

## 2023-02-02 DIAGNOSIS — I10 Essential (primary) hypertension: Secondary | ICD-10-CM | POA: Diagnosis not present

## 2023-02-02 DIAGNOSIS — G8929 Other chronic pain: Secondary | ICD-10-CM

## 2023-02-02 MED ORDER — LOSARTAN POTASSIUM 25 MG PO TABS: 25.0000 mg | ORAL_TABLET | Freq: Every day | ORAL | 1 refills | Status: DC

## 2023-02-02 MED ORDER — DAPAGLIFLOZIN PROPANEDIOL 5 MG PO TABS
5.0000 mg | ORAL_TABLET | Freq: Every day | ORAL | 3 refills | Status: DC
  Filled 2023-02-02: qty 30, 30d supply, fill #0

## 2023-02-02 MED ORDER — LOSARTAN POTASSIUM 25 MG PO TABS
25.0000 mg | ORAL_TABLET | Freq: Every day | ORAL | 1 refills | Status: DC
  Filled 2023-02-02: qty 30, 30d supply, fill #0
  Filled 2023-05-15: qty 30, 30d supply, fill #1

## 2023-02-02 MED ORDER — GLIPIZIDE 5 MG PO TABS
5.0000 mg | ORAL_TABLET | Freq: Every day | ORAL | 3 refills | Status: DC
  Filled 2023-02-02: qty 90, 90d supply, fill #0

## 2023-02-02 MED ORDER — TRAMADOL HCL 50 MG PO TABS
50.0000 mg | ORAL_TABLET | Freq: Four times a day (QID) | ORAL | 0 refills | Status: AC | PRN
  Filled 2023-02-02: qty 20, 5d supply, fill #0

## 2023-02-02 NOTE — Telephone Encounter (Signed)
Pharmacy comment: Rx is $500 after insurance, patient is asking for alternative

## 2023-02-02 NOTE — Progress Notes (Signed)
Subjective:    Patient ID: Brett Allen, male    DOB: 13-Oct-1975, 47 y.o.   MRN: [id number]  HPI Discussed the use of AI scribe software for clinical note transcription with the patient, who gave verbal consent to proceed.  History of Present Illness   The patient, with a history of hypertension and diabetes, presents for a follow-up visit. He reports not having started the prescribed Losartan 25 mg due to a pharmacy issue. He has been self-monitoring his blood glucose levels, which range from 117 upon waking to 165 postprandially. He has been managing his diabetes with Glipizide 5 mg, but has not been on any other diabetes medications due to intolerance to Metformin in the past.  The patient also reports experiencing back pain, for which he was previously prescribed Tramadol during an episode of cellulitis. He found the Tramadol helpful for his back pain and is interested in exploring this as a potential treatment option.  The patient's last A1c, checked over three months ago, was 9.7. He has not had any recent blood work. He is also interested in exploring additional diabetes medications, such as Ozempic, but is concerned about potential effects on his thyroid. He reports that his spouse, who is on the same insurance, has been able to obtain Ozempic without cost.   Pt has back pain in past.    Chronic Low Back Pain: History of degenerative disc disease at L4 and L5. Pain radiates to both legs and sometimes causes numbness in foot. -Gabapentin did not help. Tramadol in past when he had cellulitis controlled his back pan. -Continue over-the-counter Tylenol 500mg  and Ibuprofen 200mg  every 8 hours.     Last visit back pain plan above.            Review of Systems See hpi.    Objective:   Physical Exam  General Mental Status- Alert. General Appearance- Not in acute distress.   Skin General: Color- Normal Color. Moisture- Normal Moisture.  Neck Carotid Arteries- Normal  color. Moisture- Normal Moisture. No carotid bruits. No JVD.  Chest and Lung Exam Auscultation: Breath Sounds:-Normal.  Cardiovascular Auscultation:Rythm- Regular. Murmurs & Other Heart Sounds:Auscultation of the heart reveals- No Murmurs.  Abdomen Inspection:-Inspeection Normal. Palpation/Percussion:Note:No mass. Palpation and Percussion of the abdomen reveal- Non Tender, Non Distended + BS, no rebound or guarding.    Neurologic Cranial Nerve exam:- CN III-XII intact(No nystagmus), symmetric smile. Strength:- 5/5 equal and symmetric strength both upper and lower extremities.       Assessment & Plan:  Assessment and Plan    Hypertension Uncontrolled, not taking Losartan 25mg  due to prescription not reaching pharmacy. -Resend Losartan 25mg  prescription to local pharmacy. -Check blood pressure daily and report values in 7-10 days. -Consider increasing Losartan to 50mg  based on reported blood pressure values.  Type 2 Diabetes Mellitus Last A1c was 9.7 in May, self-reported blood glucose levels range from 117-165. Currently on Glipizide 5mg , unable to tolerate Metformin due to gastrointestinal side effects. -Resend Glipizide 5mg  prescription to local pharmacy. -Order A1c and metabolic panel today. -Discuss potential addition of second-line antidiabetic medication (e.g., Ozempic, Invokana, Januvia, Onglyza) based on A1c results and insurance coverage.  Chronic Back Pain -use tylenol for pain. Will add tramadol rx for pain.(20 tab rx) - on follow up discuss how you did and may need to have you sign contract and give uds  General Health Maintenance / Follow up Plans -Order lipid panel on a separate day when patient is fasting.   Follow  up in one month or sooner if needed

## 2023-02-02 NOTE — Patient Instructions (Signed)
Hypertension Uncontrolled, not taking Losartan 25mg  due to prescription not reaching pharmacy. -Resend Losartan 25mg  prescription to local pharmacy. -Check blood pressure daily and report values in 7-10 days. -Consider increasing Losartan to 50mg  based on reported blood pressure values.  Type 2 Diabetes Mellitus Last A1c was 9.7 in May, self-reported blood glucose levels range from 117-165. Currently on Glipizide 5mg , unable to tolerate Metformin due to gastrointestinal side effects. -Resend Glipizide 5mg  prescription to local pharmacy. -Order A1c and metabolic panel today. -Discuss potential addition of second-line antidiabetic medication (e.g., Ozempic, Invokana, Januvia, Onglyza) based on A1c results and insurance coverage.  Chronic Back Pain -use tylenol for pain. Will add tramadol rx for pain.(20 tab rx) - on follow up discuss how you did and may need to have you sign contract and give uds  General Health Maintenance / Follow up Plans -Order lipid panel on a separate day when patient is fasting.   Follow up in one month or sooner if needed

## 2023-02-03 LAB — LIPID PANEL
Cholesterol: 208 mg/dL — ABNORMAL HIGH (ref <200)
HDL: 47 mg/dL (ref >=40)
LDL Cholesterol (Calc): 126 mg/dL — ABNORMAL HIGH
Non-HDL Cholesterol (Calc): 161 mg/dL — ABNORMAL HIGH (ref <130)
Total CHOL/HDL Ratio: 4.4 (calc) (ref <5.0)
Triglycerides: 212 mg/dL — ABNORMAL HIGH (ref <150)

## 2023-02-03 LAB — CBC WITH DIFFERENTIAL/PLATELET
Absolute Monocytes: 615 {cells}/uL (ref 200–950)
Basophils Absolute: 41 {cells}/uL (ref 0–200)
Basophils Relative: 0.5 %
Eosinophils Absolute: 57 {cells}/uL (ref 15–500)
Eosinophils Relative: 0.7 %
HCT: 43.2 % (ref 38.5–50.0)
Hemoglobin: 14.1 g/dL (ref 13.2–17.1)
Lymphs Abs: 2739 {cells}/uL (ref 850–3900)
MCH: 25.2 pg — ABNORMAL LOW (ref 27.0–33.0)
MCHC: 32.6 g/dL (ref 32.0–36.0)
MCV: 77.3 fL — ABNORMAL LOW (ref 80.0–100.0)
MPV: 10.3 fL (ref 7.5–12.5)
Monocytes Relative: 7.5 %
Neutro Abs: 4748 {cells}/uL (ref 1500–7800)
Neutrophils Relative %: 57.9 %
Platelets: 255 10*3/uL (ref 140–400)
RBC: 5.59 10*6/uL (ref 4.20–5.80)
RDW: 13.9 % (ref 11.0–15.0)
Total Lymphocyte: 33.4 %
WBC: 8.2 10*3/uL (ref 3.8–10.8)

## 2023-02-03 LAB — COMPREHENSIVE METABOLIC PANEL
AG Ratio: 1.4 (calc) (ref 1.0–2.5)
ALT: 21 U/L (ref 9–46)
AST: 19 U/L (ref 10–40)
Albumin: 4.2 g/dL (ref 3.6–5.1)
Alkaline phosphatase (APISO): 81 U/L (ref 36–130)
BUN/Creatinine Ratio: 10 (calc) (ref 6–22)
BUN: 13 mg/dL (ref 7–25)
CO2: 27 mmol/L (ref 20–32)
Calcium: 9.6 mg/dL (ref 8.6–10.3)
Chloride: 102 mmol/L (ref 98–110)
Creat: 1.35 mg/dL — ABNORMAL HIGH (ref 0.60–1.29)
Globulin: 2.9 g/dL (ref 1.9–3.7)
Glucose, Bld: 188 mg/dL — ABNORMAL HIGH (ref 65–99)
Potassium: 3.6 mmol/L (ref 3.5–5.3)
Sodium: 139 mmol/L (ref 135–146)
Total Bilirubin: 0.4 mg/dL (ref 0.2–1.2)
Total Protein: 7.1 g/dL (ref 6.1–8.1)

## 2023-02-03 LAB — HEMOGLOBIN A1C
Hgb A1c MFr Bld: 7.9 %{Hb} — ABNORMAL HIGH (ref <5.7)
Mean Plasma Glucose: 180 mg/dL
eAG (mmol/L): 10 mmol/L

## 2023-02-03 MED ORDER — ATORVASTATIN CALCIUM 10 MG PO TABS
10.0000 mg | ORAL_TABLET | Freq: Every day | ORAL | 11 refills | Status: DC
  Filled 2023-02-03: qty 30, 30d supply, fill #0
  Filled 2023-04-28 – 2023-05-15 (×2): qty 30, 30d supply, fill #1

## 2023-02-03 MED ORDER — CANAGLIFLOZIN 100 MG PO TABS
100.0000 mg | ORAL_TABLET | Freq: Every day | ORAL | 3 refills | Status: DC
  Filled 2023-02-03: qty 30, 30d supply, fill #0

## 2023-02-03 NOTE — Addendum Note (Signed)
Addended by: Gwenevere Abbot on: 02/03/2023 09:08 PM   Modules accepted: Orders

## 2023-02-03 NOTE — Addendum Note (Signed)
Addended by: Gwenevere Abbot on: 02/03/2023 08:17 PM   Modules accepted: Orders

## 2023-02-05 ENCOUNTER — Other Ambulatory Visit (HOSPITAL_BASED_OUTPATIENT_CLINIC_OR_DEPARTMENT_OTHER): Payer: Self-pay

## 2023-02-06 ENCOUNTER — Telehealth: Payer: Self-pay

## 2023-02-06 ENCOUNTER — Other Ambulatory Visit (HOSPITAL_BASED_OUTPATIENT_CLINIC_OR_DEPARTMENT_OTHER): Payer: Self-pay

## 2023-02-06 NOTE — Telephone Encounter (Signed)
Pharmacy Patient Advocate Encounter   Received notification from CoverMyMeds that prior authorization for Invokana 100MG  tablets is required/requested.   Insurance verification completed.   The patient is insured through [employer] .   Per test claim: PA required; PA submitted to [employer] via CoverMyMeds Key/confirmation #/EOC [id number] Status is pending

## 2023-02-07 ENCOUNTER — Encounter (HOSPITAL_BASED_OUTPATIENT_CLINIC_OR_DEPARTMENT_OTHER): Payer: Self-pay

## 2023-02-07 ENCOUNTER — Emergency Department (HOSPITAL_BASED_OUTPATIENT_CLINIC_OR_DEPARTMENT_OTHER): Payer: BC Managed Care – PPO

## 2023-02-07 ENCOUNTER — Emergency Department (HOSPITAL_BASED_OUTPATIENT_CLINIC_OR_DEPARTMENT_OTHER)
Admission: EM | Admit: 2023-02-07 | Discharge: 2023-02-07 | Disposition: A | Discharge status: Home / Self Care | Payer: No Typology Code available for payment source | Attending: Emergency Medicine | Admitting: Emergency Medicine

## 2023-02-07 ENCOUNTER — Other Ambulatory Visit (HOSPITAL_BASED_OUTPATIENT_CLINIC_OR_DEPARTMENT_OTHER): Payer: Self-pay

## 2023-02-07 ENCOUNTER — Other Ambulatory Visit: Payer: Self-pay

## 2023-02-07 DIAGNOSIS — Z79899 Other long term (current) drug therapy: Secondary | ICD-10-CM | POA: Insufficient documentation

## 2023-02-07 DIAGNOSIS — Z7984 Long term (current) use of oral hypoglycemic drugs: Secondary | ICD-10-CM | POA: Diagnosis not present

## 2023-02-07 DIAGNOSIS — S43004A Unspecified dislocation of right shoulder joint, initial encounter: Secondary | ICD-10-CM | POA: Diagnosis not present

## 2023-02-07 DIAGNOSIS — M25511 Pain in right shoulder: Secondary | ICD-10-CM | POA: Diagnosis not present

## 2023-02-07 DIAGNOSIS — E119 Type 2 diabetes mellitus without complications: Secondary | ICD-10-CM | POA: Insufficient documentation

## 2023-02-07 DIAGNOSIS — S4991XA Unspecified injury of right shoulder and upper arm, initial encounter: Secondary | ICD-10-CM | POA: Diagnosis present

## 2023-02-07 DIAGNOSIS — Y990 Civilian activity done for income or pay: Secondary | ICD-10-CM | POA: Diagnosis not present

## 2023-02-07 DIAGNOSIS — I10 Essential (primary) hypertension: Secondary | ICD-10-CM | POA: Diagnosis not present

## 2023-02-07 DIAGNOSIS — W19XXXA Unspecified fall, initial encounter: Secondary | ICD-10-CM | POA: Insufficient documentation

## 2023-02-07 MED ORDER — HYDROCODONE-ACETAMINOPHEN 5-325 MG PO TABS
1.0000 | ORAL_TABLET | Freq: Once | ORAL | Status: AC
  Administered 2023-02-07: 1 via ORAL
  Filled 2023-02-07: qty 1

## 2023-02-07 MED ORDER — OXYCODONE HCL 5 MG PO TABS
5.0000 mg | ORAL_TABLET | Freq: Four times a day (QID) | ORAL | 0 refills | Status: AC | PRN
  Filled 2023-02-07: qty 20, 5d supply, fill #0

## 2023-02-07 NOTE — ED Provider Notes (Signed)
[facility] Provider Note   CSN: [id number] Arrival date & time: 02/07/23  1244     History  Chief Complaint  Patient presents with   Fall   Shoulder Injury    Brett Allen is a 47 y.o. male with a history of diabetes mellitus, hypertension, and sarcoidosis presents to the ED today for shoulder pain after a fall.  Patient reports he was lifting boxes at work when he fell back on his right outstretched hand.  His shoulder popped out of place but he was able to get back into place on his own after the initial incident.  No head injury, LOC, or blood thinner use. He still reports pain to the right shoulder but denies any other injuries at this time. He reports that he previously dislocated his right shoulder about 5 years prior.  Or concerns at this time.    Home Medications Prior to Admission medications   Medication Sig Start Date End Date Taking? Authorizing Provider  oxyCODONE (ROXICODONE) 5 MG immediate release tablet Take 1 tablet (5 mg total) by mouth every 6 (six) hours as needed for up to 5 days for severe pain. 02/07/23 02/12/23 Yes Maxwell Marion, PA-C  atorvastatin (LIPITOR) 10 MG tablet Take 1 tablet (10 mg total) by mouth daily. 02/03/23   Saguier, Ramon Dredge, PA-C  Blood Glucose Monitoring Suppl DEVI 1 each by Does not apply route in the morning, at noon, and at bedtime. May substitute to any manufacturer covered by patient's insurance. 12/19/22   Saguier, Ramon Dredge, PA-C  canagliflozin [facility]) 100 MG TABS tablet Take 1 tablet (100 mg total) by mouth daily before breakfast. 02/03/23   Saguier, Ramon Dredge, PA-C  doxycycline (VIBRA-TABS) 100 MG tablet Take 1 tablet (100 mg total) by mouth 2 (two) times daily. 11/28/22   Saguier, Ramon Dredge, PA-C  famciclovir (FAMVIR) 500 MG tablet Take 1 tablet (500 mg total) by mouth 3 (three) times daily. 11/28/22   Saguier, Ramon Dredge, PA-C  gabapentin (NEURONTIN) 100 MG capsule Take 1 capsule (100 mg total) by  mouth 3 (three) times daily. 12/28/22   Saguier, Ramon Dredge, PA-C  glipiZIDE (GLUCOTROL) 5 MG tablet Take 1 tablet (5 mg total) by mouth daily before breakfast. 02/02/23 01/28/24  Saguier, Ramon Dredge, PA-C  Glucose Blood (BLOOD GLUCOSE TEST STRIPS) STRP 1 each by In Vitro route in the morning, at noon, and at bedtime. May substitute to any manufacturer covered by patient's insurance. 12/19/22 03/29/23  Saguier, Ramon Dredge, PA-C  losartan (COZAAR) 25 MG tablet Take 1 tablet (25 mg total) by mouth daily. 12/19/22   Saguier, Ramon Dredge, PA-C  losartan (COZAAR) 25 MG tablet Take 1 tablet (25 mg total) by mouth daily. 02/02/23   Saguier, Ramon Dredge, PA-C  losartan (COZAAR) 25 MG tablet Take 1 tablet (25 mg total) by mouth daily. 02/02/23   Saguier, Ramon Dredge, PA-C  traMADol (ULTRAM) 50 MG tablet Take 1 tablet (50 mg total) by mouth every 6 (six) hours as needed for up to 5 days. 02/02/23 02/07/23  Saguier, Ramon Dredge, PA-C      Allergies    Patient has no known allergies.    Review of Systems   Review of Systems  Musculoskeletal:        Right shoulder pain  All other systems reviewed and are negative.   Physical Exam Updated Vital Signs BP (!) 161/121   Pulse 82   Temp 97.7 F (36.5 C) (Oral)   Resp 18   Ht 5\' 10"  (1.778 m)  Wt 100 kg   SpO2 99%   BMI 31.63 kg/m  Physical Exam Vitals and nursing note reviewed.  Constitutional:      Appearance: Normal appearance.  HENT:     Head: Normocephalic and atraumatic.     Mouth/Throat:     Mouth: Mucous membranes are moist.  Eyes:     Conjunctiva/sclera: Conjunctivae normal.     Pupils: Pupils are equal, round, and reactive to light.  Cardiovascular:     Rate and Rhythm: Normal rate and regular rhythm.     Pulses: Normal pulses.     Heart sounds: Normal heart sounds.  Pulmonary:     Effort: Pulmonary effort is normal.     Breath sounds: Normal breath sounds.  Musculoskeletal:        General: Swelling and tenderness present.     Comments: Swelling and tenderness to  palpation of right shoulder with decreased ROM second to pain. ROM of elbow, wrist, and hand intact. No loss of sensation or grip strength. Radial pulse present.  Skin:    General: Skin is warm and dry.     Findings: No rash.  Neurological:     General: No focal deficit present.     Mental Status: He is alert.     Sensory: No sensory deficit.     Motor: No weakness.  Psychiatric:        Mood and Affect: Mood normal.        Behavior: Behavior normal.     ED Results / Procedures / Treatments   Labs (all labs ordered are listed, but only abnormal results are displayed) Labs Reviewed - No data to display  EKG None  Radiology DG Shoulder Right  Result Date: 02/07/2023 CLINICAL DATA:  Fall injuring shoulder.  Pain. EXAM: RIGHT SHOULDER - 2+ VIEW COMPARISON:  03/14/2021 FINDINGS: Lucency through the inferior aspect of the glenoid may represent a nondisplaced fracture, often a bony Bankart in the setting of recent dislocation. The glenohumeral alignment is currently normal. Acromioclavicular alignment is normal. No erosive change. No soft tissue calcifications. IMPRESSION: Lucency through the inferior aspect of the glenoid may represent a nondisplaced fracture, often a bony Bankart in the setting of recent dislocation. The glenohumeral alignment is currently normal. Recommend correlation for history of shoulder dislocation. Electronically Signed   By: Narda Rutherford M.D.   On: 02/07/2023 13:29    Procedures Procedures: not indicated.   Medications Ordered in ED Medications  HYDROcodone-acetaminophen (NORCO/VICODIN) 5-325 MG per tablet 1 tablet (1 tablet Oral Given 02/07/23 1406)    ED Course/ Medical Decision Making/ A&P                                 Medical Decision Making Amount and/or Complexity of Data Reviewed Radiology: ordered.  Risk Prescription drug management.   This patient presents to the ED for concern of shoulder injury, this involves an extensive number of  treatment options, and is a complaint that carries with it a high risk of complications and morbidity.   Differential diagnosis includes: fracture, dislocation, ligamentous injury, muscle strain, contusion, etc.   Comorbidities  See HPI above   Additional History  Additional history obtained from patient's previous records.   Imaging Studies  I ordered imaging studies including right shoulder x-ray  I independently visualized and interpreted imaging which showed: lucency through inferior aspect of glenoid, possible nondisplaced fracture. Normal glenohumeral alignment. I agree with the radiologist  interpretation   Problem List / ED Course / Critical Interventions / Medication Management  Right shoulder dislocation I ordered medications including: Norco for pain  Reevaluation of the patient after these medicines showed that the patient improved I have reviewed the patients home medicines and have made adjustments as needed Shoulder immobilizer applied in the ED   Social Determinants of Health  Occupation   Test / Admission - Considered  Discussed results with patient.  He is hemodynamically stable and safe for discharge home. Prescription for Oxycodone sent to the pharmacy. Information for orthopedics provided for patient to follow-up with in several days. Return precautions provided.       Final Clinical Impression(s) / ED Diagnoses Final diagnoses:  Shoulder dislocation, right, initial encounter    Rx / DC Orders ED Discharge Orders          Ordered    oxyCODONE (ROXICODONE) 5 MG immediate release tablet  Every 6 hours PRN        02/07/23 1448              Maxwell Marion, PA-C 02/07/23 1506    Horton, Clabe Seal, DO 02/08/23 1711

## 2023-02-07 NOTE — ED Triage Notes (Signed)
The patient fell and hurt his left shoulder. No other injury.

## 2023-02-07 NOTE — Discharge Instructions (Addendum)
As discussed, you successfully popped your shoulder back into place. You do have a small fracture though at the bottom of your glenohumeral joint. Please wear the shoulder immobilizer for the next 3-5 days. After that, you can switch to a sling if you prefer.  I have provided information for orthopedic clinic here in [facility].  Please call the office to schedule an appointment for reevaluation in the next several days.  I have sent a prescription of Oxycodone to your pharmacy you can take this every 6 hours as needed for pain. For swelling, you can ice the shoulder or use Ibuprofen every 4-6 hours as needed.  Get help right away if: Your pain gets worse, not better. You lose feeling in your arm or hand. Your arm or hand turns white and cold.

## 2023-02-08 ENCOUNTER — Other Ambulatory Visit (HOSPITAL_BASED_OUTPATIENT_CLINIC_OR_DEPARTMENT_OTHER): Payer: Self-pay

## 2023-02-08 ENCOUNTER — Ambulatory Visit (HOSPITAL_BASED_OUTPATIENT_CLINIC_OR_DEPARTMENT_OTHER): Payer: BC Managed Care – PPO | Admitting: Student

## 2023-02-08 NOTE — Telephone Encounter (Signed)
Pharmacy Patient Advocate Encounter  Received notification from [facility] that Prior Authorization for Invokana 100MG  tablets has been DENIED. Please advise how you'd like to proceed. Full denial letter will be uploaded to the media tab. See denial reason below.   PA #/Case ID/Reference #:  [id number]    Denial Reason: Patient has not tried and failed Jardiance, Synjardy, or Synjardy XR (extended release) and has tried and failed brand [place] or brand Xigduo XR (extended release)

## 2023-02-09 ENCOUNTER — Other Ambulatory Visit (HOSPITAL_BASED_OUTPATIENT_CLINIC_OR_DEPARTMENT_OTHER): Payer: Self-pay

## 2023-02-12 ENCOUNTER — Other Ambulatory Visit (HOSPITAL_BASED_OUTPATIENT_CLINIC_OR_DEPARTMENT_OTHER): Payer: Self-pay

## 2023-02-13 ENCOUNTER — Other Ambulatory Visit (HOSPITAL_BASED_OUTPATIENT_CLINIC_OR_DEPARTMENT_OTHER): Payer: Self-pay

## 2023-02-14 ENCOUNTER — Ambulatory Visit: Payer: BC Managed Care – PPO | Admitting: Sports Medicine

## 2023-02-20 ENCOUNTER — Other Ambulatory Visit (HOSPITAL_BASED_OUTPATIENT_CLINIC_OR_DEPARTMENT_OTHER): Payer: Self-pay

## 2023-02-21 ENCOUNTER — Other Ambulatory Visit (HOSPITAL_BASED_OUTPATIENT_CLINIC_OR_DEPARTMENT_OTHER): Payer: Self-pay

## 2023-02-21 DIAGNOSIS — D170 Benign lipomatous neoplasm of skin and subcutaneous tissue of head, face and neck: Secondary | ICD-10-CM | POA: Diagnosis not present

## 2023-02-23 ENCOUNTER — Other Ambulatory Visit (HOSPITAL_BASED_OUTPATIENT_CLINIC_OR_DEPARTMENT_OTHER): Payer: Self-pay

## 2023-02-23 ENCOUNTER — Encounter: Payer: Self-pay | Admitting: Medical

## 2023-02-23 MED ORDER — METHOCARBAMOL 500 MG PO TABS
500.0000 mg | ORAL_TABLET | Freq: Every evening | ORAL | 0 refills | Status: DC | PRN
  Filled 2023-02-23: qty 15, 15d supply, fill #0

## 2023-02-23 MED ORDER — NABUMETONE 500 MG PO TABS
500.0000 mg | ORAL_TABLET | Freq: Two times a day (BID) | ORAL | 1 refills | Status: DC | PRN
  Filled 2023-02-23: qty 30, 15d supply, fill #0

## 2023-02-25 MED ORDER — GLIPIZIDE 5 MG PO TABS
5.0000 mg | ORAL_TABLET | Freq: Two times a day (BID) | ORAL | 3 refills | Status: DC
  Filled 2023-02-25 – 2023-05-15 (×3): qty 60, 30d supply, fill #0
  Filled 2023-07-07: qty 60, 30d supply, fill #1

## 2023-02-25 MED ORDER — LOSARTAN POTASSIUM 50 MG PO TABS
50.0000 mg | ORAL_TABLET | Freq: Every day | ORAL | 1 refills | Status: DC
  Filled 2023-02-25: qty 90, 90d supply, fill #0
  Filled 2023-04-28 – 2023-07-07 (×2): qty 90, 90d supply, fill #1

## 2023-02-25 NOTE — Addendum Note (Signed)
Addended by: Gwenevere Abbot on: 02/25/2023 07:49 PM   Modules accepted: Orders

## 2023-02-26 ENCOUNTER — Other Ambulatory Visit (HOSPITAL_BASED_OUTPATIENT_CLINIC_OR_DEPARTMENT_OTHER): Payer: Self-pay

## 2023-02-26 ENCOUNTER — Other Ambulatory Visit: Payer: Self-pay

## 2023-02-26 MED ORDER — METFORMIN HCL 500 MG PO TABS
500.0000 mg | ORAL_TABLET | Freq: Two times a day (BID) | ORAL | 11 refills | Status: DC
  Filled 2023-02-26: qty 60, 30d supply, fill #0

## 2023-02-26 NOTE — Addendum Note (Signed)
Addended by: Gwenevere Abbot on: 02/26/2023 08:59 AM   Modules accepted: Orders

## 2023-02-27 ENCOUNTER — Other Ambulatory Visit (HOSPITAL_BASED_OUTPATIENT_CLINIC_OR_DEPARTMENT_OTHER): Payer: Self-pay

## 2023-02-28 ENCOUNTER — Other Ambulatory Visit (HOSPITAL_BASED_OUTPATIENT_CLINIC_OR_DEPARTMENT_OTHER): Payer: Self-pay

## 2023-03-01 ENCOUNTER — Other Ambulatory Visit (HOSPITAL_BASED_OUTPATIENT_CLINIC_OR_DEPARTMENT_OTHER): Payer: Self-pay

## 2023-03-05 ENCOUNTER — Other Ambulatory Visit (HOSPITAL_BASED_OUTPATIENT_CLINIC_OR_DEPARTMENT_OTHER): Payer: Self-pay

## 2023-03-05 ENCOUNTER — Ambulatory Visit: Payer: BC Managed Care – PPO | Admitting: Medical

## 2023-03-06 ENCOUNTER — Other Ambulatory Visit (HOSPITAL_BASED_OUTPATIENT_CLINIC_OR_DEPARTMENT_OTHER): Payer: Self-pay

## 2023-03-08 ENCOUNTER — Other Ambulatory Visit (HOSPITAL_BASED_OUTPATIENT_CLINIC_OR_DEPARTMENT_OTHER): Payer: Self-pay

## 2023-03-12 DIAGNOSIS — R200 Anesthesia of skin: Secondary | ICD-10-CM | POA: Diagnosis not present

## 2023-03-12 DIAGNOSIS — S43431A Superior glenoid labrum lesion of right shoulder, initial encounter: Secondary | ICD-10-CM | POA: Diagnosis not present

## 2023-03-12 DIAGNOSIS — M19011 Primary osteoarthritis, right shoulder: Secondary | ICD-10-CM | POA: Diagnosis not present

## 2023-03-15 DIAGNOSIS — D170 Benign lipomatous neoplasm of skin and subcutaneous tissue of head, face and neck: Secondary | ICD-10-CM | POA: Diagnosis not present

## 2023-03-19 DIAGNOSIS — D170 Benign lipomatous neoplasm of skin and subcutaneous tissue of head, face and neck: Secondary | ICD-10-CM | POA: Diagnosis not present

## 2023-03-21 ENCOUNTER — Other Ambulatory Visit: Payer: Self-pay

## 2023-03-21 ENCOUNTER — Emergency Department (HOSPITAL_BASED_OUTPATIENT_CLINIC_OR_DEPARTMENT_OTHER)
Admission: EM | Admit: 2023-03-21 | Discharge: 2023-03-21 | Disposition: A | Discharge status: Home / Self Care | Payer: BC Managed Care – PPO | Attending: Emergency Medicine | Admitting: Emergency Medicine

## 2023-03-21 ENCOUNTER — Emergency Department (HOSPITAL_BASED_OUTPATIENT_CLINIC_OR_DEPARTMENT_OTHER): Payer: BC Managed Care – PPO

## 2023-03-21 ENCOUNTER — Encounter (HOSPITAL_BASED_OUTPATIENT_CLINIC_OR_DEPARTMENT_OTHER): Payer: Self-pay | Admitting: Emergency Medicine

## 2023-03-21 ENCOUNTER — Other Ambulatory Visit (HOSPITAL_BASED_OUTPATIENT_CLINIC_OR_DEPARTMENT_OTHER): Payer: Self-pay

## 2023-03-21 DIAGNOSIS — J168 Pneumonia due to other specified infectious organisms: Secondary | ICD-10-CM | POA: Insufficient documentation

## 2023-03-21 DIAGNOSIS — J069 Acute upper respiratory infection, unspecified: Secondary | ICD-10-CM | POA: Diagnosis not present

## 2023-03-21 DIAGNOSIS — E119 Type 2 diabetes mellitus without complications: Secondary | ICD-10-CM | POA: Diagnosis not present

## 2023-03-21 DIAGNOSIS — Z79899 Other long term (current) drug therapy: Secondary | ICD-10-CM | POA: Insufficient documentation

## 2023-03-21 DIAGNOSIS — J189 Pneumonia, unspecified organism: Secondary | ICD-10-CM

## 2023-03-21 DIAGNOSIS — I10 Essential (primary) hypertension: Secondary | ICD-10-CM | POA: Insufficient documentation

## 2023-03-21 DIAGNOSIS — Z7984 Long term (current) use of oral hypoglycemic drugs: Secondary | ICD-10-CM | POA: Insufficient documentation

## 2023-03-21 DIAGNOSIS — R509 Fever, unspecified: Secondary | ICD-10-CM | POA: Diagnosis not present

## 2023-03-21 DIAGNOSIS — R059 Cough, unspecified: Secondary | ICD-10-CM | POA: Diagnosis not present

## 2023-03-21 DIAGNOSIS — Z20822 Contact with and (suspected) exposure to covid-19: Secondary | ICD-10-CM | POA: Insufficient documentation

## 2023-03-21 DIAGNOSIS — E876 Hypokalemia: Secondary | ICD-10-CM | POA: Diagnosis not present

## 2023-03-21 LAB — BASIC METABOLIC PANEL
Anion gap: 15 (ref 5–15)
BUN: 11 mg/dL (ref 6–20)
CO2: 22 mmol/L (ref 22–32)
Calcium: 8.9 mg/dL (ref 8.9–10.3)
Chloride: 98 mmol/L (ref 98–111)
Creatinine, Ser: 1.25 mg/dL — ABNORMAL HIGH (ref 0.61–1.24)
GFR, Estimated: >60 mL/min (ref >60)
Glucose, Bld: 157 mg/dL — ABNORMAL HIGH (ref 70–99)
Potassium: 2.8 mmol/L — ABNORMAL LOW (ref 3.5–5.1)
Sodium: 135 mmol/L (ref 135–145)

## 2023-03-21 LAB — CBC
HCT: 43.9 % (ref 39.0–52.0)
Hemoglobin: 14.7 g/dL (ref 13.0–17.0)
MCH: 25.4 pg — ABNORMAL LOW (ref 26.0–34.0)
MCHC: 33.5 g/dL (ref 30.0–36.0)
MCV: 75.8 fL — ABNORMAL LOW (ref 80.0–100.0)
Platelets: 239 10*3/uL (ref 150–400)
RBC: 5.79 MIL/uL (ref 4.22–5.81)
RDW: 13.4 % (ref 11.5–15.5)
WBC: 9.4 10*3/uL (ref 4.0–10.5)
nRBC: 0 % (ref 0.0–0.2)

## 2023-03-21 LAB — RESP PANEL BY RT-PCR (RSV, FLU A&B, COVID)  RVPGX2
Influenza A by PCR: NEGATIVE
Influenza B by PCR: NEGATIVE
Resp Syncytial Virus by PCR: NEGATIVE
SARS Coronavirus 2 by RT PCR: NEGATIVE

## 2023-03-21 MED ORDER — SODIUM CHLORIDE 0.9 % IV BOLUS
1000.0000 mL | Freq: Once | INTRAVENOUS | Status: AC
  Administered 2023-03-21: 1000 mL via INTRAVENOUS

## 2023-03-21 MED ORDER — KETOROLAC TROMETHAMINE 15 MG/ML IJ SOLN
15.0000 mg | Freq: Once | INTRAMUSCULAR | Status: AC
  Administered 2023-03-21: 15 mg via INTRAVENOUS
  Filled 2023-03-21: qty 1

## 2023-03-21 MED ORDER — AZITHROMYCIN 250 MG PO TABS
500.0000 mg | ORAL_TABLET | ORAL | Status: AC
  Administered 2023-03-21: 500 mg via ORAL
  Filled 2023-03-21: qty 2

## 2023-03-21 MED ORDER — AMOXICILLIN-POT CLAVULANATE 875-125 MG PO TABS
1.0000 | ORAL_TABLET | Freq: Two times a day (BID) | ORAL | 0 refills | Status: AC
Start: 2023-03-21 — End: 2023-03-26
  Filled 2023-03-21: qty 10, 5d supply, fill #0

## 2023-03-21 MED ORDER — AZITHROMYCIN 250 MG PO TABS
250.0000 mg | ORAL_TABLET | Freq: Every day | ORAL | 0 refills | Status: DC
  Filled 2023-03-21: qty 6, 5d supply, fill #0

## 2023-03-21 MED ORDER — POTASSIUM CHLORIDE 20 MEQ PO PACK
80.0000 meq | PACK | ORAL | Status: AC
  Administered 2023-03-21: 80 meq via ORAL
  Filled 2023-03-21: qty 4

## 2023-03-21 MED ORDER — SODIUM CHLORIDE 0.9 % IV SOLN
1.0000 g | Freq: Once | INTRAVENOUS | Status: AC
  Administered 2023-03-21: 1 g via INTRAVENOUS
  Filled 2023-03-21: qty 10

## 2023-03-21 MED ORDER — POTASSIUM CHLORIDE CRYS ER 20 MEQ PO TBCR
20.0000 meq | EXTENDED_RELEASE_TABLET | Freq: Two times a day (BID) | ORAL | 0 refills | Status: DC
  Filled 2023-03-21: qty 10, 5d supply, fill #0

## 2023-03-21 MED ORDER — ACETAMINOPHEN 325 MG PO TABS: 650.0000 mg | ORAL_TABLET | Freq: Once | ORAL | Status: DC | PRN

## 2023-03-21 MED ORDER — ACETAMINOPHEN 500 MG PO TABS
1000.0000 mg | ORAL_TABLET | Freq: Once | ORAL | Status: AC | PRN
  Administered 2023-03-21: 1000 mg via ORAL
  Filled 2023-03-21: qty 2

## 2023-03-21 NOTE — ED Triage Notes (Signed)
Pt URI Sx chest burns with cough, since yesterday. Took OTC meds yesterday afternoon.

## 2023-03-21 NOTE — ED Provider Notes (Signed)
[facility] AT [facility] Provider Note   CSN: [id number] Arrival date & time: 03/21/23  4098     History  Chief Complaint  Patient presents with   URI    Brett Allen is a 47 y.o. male.  47 year old male with a history of hypertension and diabetes who presents to the emergency department with runny nose, cough, fever, and fatigue.  Patient reports that yesterday started having a productive cough as well as generalized fatigue.  Has had a runny nose as well.  Says that a coworker was sick on Monday with similar symptoms.       Home Medications Prior to Admission medications   Medication Sig Start Date End Date Taking? Authorizing Provider  amoxicillin-clavulanate (AUGMENTIN) 875-125 MG tablet Take 1 tablet by mouth every 12 (twelve) hours for 5 days. 03/21/23 03/26/23 Yes Rondel Baton, MD  azithromycin (ZITHROMAX) 250 MG tablet Take 1 tablet (250 mg total) by mouth daily. Take first 2 tablets together, then 1 every day until finished. 03/21/23  Yes Rondel Baton, MD  potassium chloride SA (KLOR-CON M) 20 MEQ tablet Take 1 tablet (20 mEq total) by mouth 2 (two) times daily for 5 days. 03/21/23 03/26/23 Yes Rondel Baton, MD  atorvastatin (LIPITOR) 10 MG tablet Take 1 tablet (10 mg total) by mouth daily. 02/03/23   Saguier, Ramon Dredge, PA-C  Blood Glucose Monitoring Suppl DEVI 1 each by Does not apply route in the morning, at noon, and at bedtime. May substitute to any manufacturer covered by patient's insurance. 12/19/22   Saguier, Ramon Dredge, PA-C  canagliflozin [facility]) 100 MG TABS tablet Take 1 tablet (100 mg total) by mouth daily before breakfast. 02/03/23   Saguier, Ramon Dredge, PA-C  famciclovir (FAMVIR) 500 MG tablet Take 1 tablet (500 mg total) by mouth 3 (three) times daily. 11/28/22   Saguier, Ramon Dredge, PA-C  gabapentin (NEURONTIN) 100 MG capsule Take 1 capsule (100 mg total) by mouth 3 (three) times daily. 12/28/22   Saguier, Ramon Dredge, PA-C   glipiZIDE (GLUCOTROL) 5 MG tablet Take 1 tablet (5 mg total) by mouth daily before breakfast. 02/02/23 01/28/24  Saguier, Ramon Dredge, PA-C  glipiZIDE (GLUCOTROL) 5 MG tablet Take 1 tablet (5 mg total) by mouth 2 (two) times daily before a meal. 02/25/23   Saguier, Ramon Dredge, PA-C  Glucose Blood (BLOOD GLUCOSE TEST STRIPS) STRP 1 each by In Vitro route in the morning, at noon, and at bedtime. May substitute to any manufacturer covered by patient's insurance. 12/19/22 03/29/23  Saguier, Ramon Dredge, PA-C  losartan (COZAAR) 25 MG tablet Take 1 tablet (25 mg total) by mouth daily. 12/19/22   Saguier, Ramon Dredge, PA-C  losartan (COZAAR) 25 MG tablet Take 1 tablet (25 mg total) by mouth daily. 02/02/23   Saguier, Ramon Dredge, PA-C  losartan (COZAAR) 25 MG tablet Take 1 tablet (25 mg total) by mouth daily. 02/02/23   Saguier, Ramon Dredge, PA-C  losartan (COZAAR) 50 MG tablet Take 1 tablet (50 mg total) by mouth daily. 02/25/23   Saguier, Ramon Dredge, PA-C  metFORMIN (GLUCOPHAGE) 500 MG tablet Take 1 tablet (500 mg total) by mouth 2 (two) times daily with a meal. 02/26/23   Saguier, Ramon Dredge, PA-C  methocarbamol (ROBAXIN) 500 MG tablet Take 1 tablet (500 mg total) by mouth at bedtime as needed for muscle spasms 02/22/23     nabumetone (RELAFEN) 500 MG tablet Take 1 tablet (500 mg total) by mouth every 12 (twelve) hours as needed. 02/22/23         Allergies  Patient has no known allergies.    Review of Systems   Review of Systems  Physical Exam Updated Vital Signs BP 135/84   Pulse 87   Temp 98.8 F (37.1 C) (Oral)   Resp 18   Ht 5\' 10"  (1.778 m)   Wt 96.2 kg   SpO2 98%   BMI 30.42 kg/m  Physical Exam Vitals and nursing note reviewed.  Constitutional:      General: He is not in acute distress.    Appearance: He is well-developed.  HENT:     Head: Normocephalic and atraumatic.     Right Ear: External ear normal.     Left Ear: External ear normal.     Nose: Nose normal.  Eyes:     Extraocular Movements: Extraocular movements intact.      Conjunctiva/sclera: Conjunctivae normal.     Pupils: Pupils are equal, round, and reactive to light.  Cardiovascular:     Rate and Rhythm: Regular rhythm. Tachycardia present.     Heart sounds: Normal heart sounds.  Pulmonary:     Effort: Pulmonary effort is normal. No respiratory distress.     Breath sounds: Normal breath sounds.  Musculoskeletal:     Cervical back: Normal range of motion and neck supple.     Right lower leg: No edema.     Left lower leg: No edema.  Skin:    General: Skin is warm and dry.  Neurological:     Mental Status: He is alert. Mental status is at baseline.  Psychiatric:        Mood and Affect: Mood normal.        Behavior: Behavior normal.     ED Results / Procedures / Treatments   Labs (all labs ordered are listed, but only abnormal results are displayed) Labs Reviewed  CBC - Abnormal; Notable for the following components:      Result Value   MCV 75.8 (*)    MCH 25.4 (*)    All other components within normal limits  BASIC METABOLIC PANEL - Abnormal; Notable for the following components:   Potassium 2.8 (*)    Glucose, Bld 157 (*)    Creatinine, Ser 1.25 (*)    All other components within normal limits  RESP PANEL BY RT-PCR (RSV, FLU A&B, COVID)  [id number]    EKG None  Radiology DG Chest 2 View  Result Date: 03/21/2023 CLINICAL DATA:  Cough and fever EXAM: CHEST - 2 VIEW COMPARISON:  10/09/2022 FINDINGS: The heart size and mediastinal contours are within normal limits. Both lungs are clear. The visualized skeletal structures are unremarkable. IMPRESSION: No active cardiopulmonary disease. Electronically Signed   By: Judie Petit.  Shick M.D.   On: 03/21/2023 08:21    Procedures Procedures    Medications Ordered in ED Medications  acetaminophen (TYLENOL) tablet 1,000 mg (1,000 mg Oral Given 03/21/23 0716)  ketorolac (TORADOL) 15 MG/ML injection 15 mg (15 mg Intravenous Given 03/21/23 0910)  sodium chloride 0.9 % bolus 1,000 mL (0 mLs Intravenous  Stopped 03/21/23 1029)  cefTRIAXone (ROCEPHIN) 1 g in sodium chloride 0.9 % 100 mL IVPB (0 g Intravenous Stopped 03/21/23 0957)  azithromycin (ZITHROMAX) tablet 500 mg (500 mg Oral Given 03/21/23 0910)  potassium chloride (KLOR-CON) packet 80 mEq (80 mEq Oral Given 03/21/23 0950)    ED Course/ Medical Decision Making/ A&P  Medical Decision Making Amount and/or Complexity of Data Reviewed Labs: ordered. Radiology: ordered.  Risk OTC drugs. Prescription drug management.   Brett Allen is a 47 y.o. male with comorbidities that complicate the patient evaluation including hypertension and diabetes who presents emergency department with runny nose, cough, fever and fatigue  Initial Ddx:  URI, pneumonia, sinusitis, sepsis  MDM/Course:  Patient presents to the emergency department with URI symptoms.  Was febrile and tachycardic on arrival.  He is overall well-appearing.  Initially was suspected URI but his COVID and flu are negative.  Did obtain lab work as well as a chest x-ray.  Chest x-ray did not show evidence of pneumonia.  However, given that the patient does have diabetes and is early on into his illness we will go ahead and treat him with antibiotics for pneumonia in case it has not shown up on chest x-ray yet.  Was also found to be hypokalemic and was given potassium to take at home along with antibiotics.  Upon re-evaluation heart rate and fever had resolved.  Will have him follow-up with primary doctor in several days.  This patient presents to the ED for concern of complaints listed in HPI, this involves an extensive number of treatment options, and is a complaint that carries with it a high risk of complications and morbidity. Disposition including potential need for admission considered.   Dispo: DC Home. Return precautions discussed including, but not limited to, those listed in the AVS. Allowed pt time to ask questions which were answered fully  prior to dc.  Records reviewed Outpatient Clinic Notes The following labs were independently interpreted: Chemistry and show  hypokalemia I independently reviewed the following imaging with scope of interpretation limited to determining acute life threatening conditions related to emergency care: Chest x-ray and agree with the radiologist interpretation with the following exceptions: none I personally reviewed and interpreted cardiac monitoring: normal sinus rhythm  I personally reviewed and interpreted the pt's EKG: see above for interpretation  I have reviewed the patients home medications and made adjustments as needed   Portions of this note were generated with Dragon dictation software. Dictation errors may occur despite best attempts at proofreading.           Final Clinical Impression(s) / ED Diagnoses Final diagnoses:  Pneumonia due to infectious organism, unspecified laterality, unspecified part of lung  Upper respiratory tract infection, unspecified type  Hypokalemia    Rx / DC Orders ED Discharge Orders          Ordered    amoxicillin-clavulanate (AUGMENTIN) 875-125 MG tablet  Every 12 hours        03/21/23 1000    azithromycin (ZITHROMAX) 250 MG tablet  Daily        03/21/23 1000    potassium chloride SA (KLOR-CON M) 20 MEQ tablet  2 times daily        03/21/23 1001              Rondel Baton, MD 03/21/23 1151

## 2023-03-21 NOTE — Discharge Instructions (Signed)
You were seen for your cough and fever in the emergency department.   At home, please take the antibiotics in case this is from a pneumonia.  Take the potassium since your potassium was found to be low.    Check your MyChart online for the results of any tests that had not resulted by the time you left the emergency department.   Follow-up with your primary doctor in 2-3 days regarding your visit.    Return immediately to the emergency department if you experience any of the following: Difficulty breathing, or any other concerning symptoms.    Thank you for visiting our Emergency Department. It was a pleasure taking care of you today.

## 2023-04-02 DIAGNOSIS — R221 Localized swelling, mass and lump, neck: Secondary | ICD-10-CM | POA: Diagnosis not present

## 2023-04-02 DIAGNOSIS — E119 Type 2 diabetes mellitus without complications: Secondary | ICD-10-CM | POA: Diagnosis not present

## 2023-04-02 DIAGNOSIS — D170 Benign lipomatous neoplasm of skin and subcutaneous tissue of head, face and neck: Secondary | ICD-10-CM | POA: Diagnosis not present

## 2023-04-03 DIAGNOSIS — D170 Benign lipomatous neoplasm of skin and subcutaneous tissue of head, face and neck: Secondary | ICD-10-CM | POA: Diagnosis not present

## 2023-04-03 DIAGNOSIS — R221 Localized swelling, mass and lump, neck: Secondary | ICD-10-CM | POA: Diagnosis not present

## 2023-04-03 DIAGNOSIS — E119 Type 2 diabetes mellitus without complications: Secondary | ICD-10-CM | POA: Diagnosis not present

## 2023-04-17 ENCOUNTER — Other Ambulatory Visit (HOSPITAL_COMMUNITY): Payer: Self-pay

## 2023-04-17 DIAGNOSIS — D170 Benign lipomatous neoplasm of skin and subcutaneous tissue of head, face and neck: Secondary | ICD-10-CM | POA: Diagnosis not present

## 2023-04-30 ENCOUNTER — Other Ambulatory Visit: Payer: Self-pay

## 2023-04-30 ENCOUNTER — Other Ambulatory Visit (HOSPITAL_BASED_OUTPATIENT_CLINIC_OR_DEPARTMENT_OTHER): Payer: Self-pay

## 2023-05-09 ENCOUNTER — Ambulatory Visit: Payer: BC Managed Care – PPO | Admitting: Medical

## 2023-05-10 ENCOUNTER — Other Ambulatory Visit (HOSPITAL_BASED_OUTPATIENT_CLINIC_OR_DEPARTMENT_OTHER): Payer: Self-pay

## 2023-05-11 ENCOUNTER — Ambulatory Visit: Payer: BC Managed Care – PPO | Admitting: Medical

## 2023-05-15 ENCOUNTER — Other Ambulatory Visit (HOSPITAL_BASED_OUTPATIENT_CLINIC_OR_DEPARTMENT_OTHER): Payer: Self-pay

## 2023-06-20 ENCOUNTER — Encounter (HOSPITAL_BASED_OUTPATIENT_CLINIC_OR_DEPARTMENT_OTHER): Payer: Self-pay

## 2023-06-20 ENCOUNTER — Other Ambulatory Visit: Payer: Self-pay

## 2023-06-20 ENCOUNTER — Emergency Department (HOSPITAL_BASED_OUTPATIENT_CLINIC_OR_DEPARTMENT_OTHER): Payer: BC Managed Care – PPO

## 2023-06-20 ENCOUNTER — Emergency Department (HOSPITAL_BASED_OUTPATIENT_CLINIC_OR_DEPARTMENT_OTHER)
Admission: EM | Admit: 2023-06-20 | Discharge: 2023-06-20 | Disposition: A | Discharge status: Home / Self Care | Payer: BC Managed Care – PPO | Attending: Emergency Medicine | Admitting: Emergency Medicine

## 2023-06-20 DIAGNOSIS — R0981 Nasal congestion: Secondary | ICD-10-CM | POA: Insufficient documentation

## 2023-06-20 DIAGNOSIS — Z20822 Contact with and (suspected) exposure to covid-19: Secondary | ICD-10-CM | POA: Diagnosis not present

## 2023-06-20 DIAGNOSIS — R509 Fever, unspecified: Secondary | ICD-10-CM | POA: Insufficient documentation

## 2023-06-20 DIAGNOSIS — M791 Myalgia, unspecified site: Secondary | ICD-10-CM | POA: Insufficient documentation

## 2023-06-20 DIAGNOSIS — R059 Cough, unspecified: Secondary | ICD-10-CM | POA: Diagnosis not present

## 2023-06-20 DIAGNOSIS — R0989 Other specified symptoms and signs involving the circulatory and respiratory systems: Secondary | ICD-10-CM | POA: Diagnosis not present

## 2023-06-20 LAB — RESP PANEL BY RT-PCR (RSV, FLU A&B, COVID)  RVPGX2
Influenza A by PCR: NEGATIVE
Influenza B by PCR: NEGATIVE
Resp Syncytial Virus by PCR: NEGATIVE
SARS Coronavirus 2 by RT PCR: NEGATIVE

## 2023-06-20 MED ORDER — DOXYCYCLINE HYCLATE 100 MG PO CAPS
100.0000 mg | ORAL_CAPSULE | Freq: Two times a day (BID) | ORAL | 0 refills | Status: DC
  Filled 2023-06-20: qty 12, 6d supply, fill #0

## 2023-06-20 MED ORDER — TRIAMCINOLONE ACETONIDE 55 MCG/ACT NA AERO
2.0000 | INHALATION_SPRAY | Freq: Every day | NASAL | 12 refills | Status: DC
  Filled 2023-06-20: qty 1, 1d supply, fill #0

## 2023-06-20 MED ORDER — DOXYCYCLINE HYCLATE 100 MG PO CAPS: 100.0000 mg | ORAL_CAPSULE | Freq: Two times a day (BID) | ORAL | 0 refills | Status: AC

## 2023-06-20 MED ORDER — TRIAMCINOLONE ACETONIDE 55 MCG/ACT NA AERO: 2.0000 | INHALATION_SPRAY | Freq: Every day | NASAL | 12 refills | Status: DC

## 2023-06-20 NOTE — ED Provider Notes (Signed)
 [facility] Provider Note   CSN: [id number] Arrival date & time: 06/20/23  1248     History  Chief Complaint  Patient presents with   Fever    Brett Allen is a 48 y.o. male.   Fever   48 year old male presents emergency department with complaints of fever, chills, cough.  Patient has been ill with symptoms for the past week or 2.  States that he began with nasal congestion, body aches in addition to his other symptoms.  Son ill with similar symptoms at home near the beginning of patient's illness but son has since improved.  Patient reports persistent cough as well as feeling of fever/chills at home.  Denies any shortness of breath, abdominal pain, nausea, vomiting, urinary symptoms, change in bowel habits.  States he has been using at home Coricidin with some improvement of symptoms.  Presents emergency department for further assessment.  Past medical history significant for chronic back pain, hyperlipidemia, cellulitis  Home Medications Prior to Admission medications   Medication Sig Start Date End Date Taking? Authorizing Provider  atorvastatin  (LIPITOR) 10 MG tablet Take 1 tablet (10 mg total) by mouth daily. 02/03/23   Saguier, [place], PA-C  azithromycin  (ZITHROMAX ) 250 MG tablet Take 1 tablet (250 mg total) by mouth daily. Take first 2 tablets together, then 1 every day until finished. 03/21/23   Yolande Lamar BROCKS, MD  Blood Glucose Monitoring Suppl DEVI 1 each by Does not apply route in the morning, at noon, and at bedtime. May substitute to any manufacturer covered by patient's insurance. 12/19/22   Saguier, [place], PA-C  canagliflozin  (INVOKANA ) 100 MG TABS tablet Take 1 tablet (100 mg total) by mouth daily before breakfast. 02/03/23   Saguier, [place], PA-C  doxycycline  (VIBRAMYCIN ) 100 MG capsule Take 1 capsule (100 mg total) by mouth 2 (two) times daily for 6 days. 06/20/23 06/26/23  Silver Wonda LABOR, PA  famciclovir  (FAMVIR ) 500  MG tablet Take 1 tablet (500 mg total) by mouth 3 (three) times daily. 11/28/22   Saguier, [place], PA-C  gabapentin  (NEURONTIN ) 100 MG capsule Take 1 capsule (100 mg total) by mouth 3 (three) times daily. 12/28/22   Saguier, [place], PA-C  glipiZIDE  (GLUCOTROL ) 5 MG tablet Take 1 tablet (5 mg total) by mouth daily before breakfast. 02/02/23 01/28/24  Saguier, [place], PA-C  glipiZIDE  (GLUCOTROL ) 5 MG tablet Take 1 tablet (5 mg total) by mouth 2 (two) times daily before a meal. 02/25/23   Saguier, [place], PA-C  losartan  (COZAAR ) 25 MG tablet Take 1 tablet (25 mg total) by mouth daily. 12/19/22   Saguier, [place], PA-C  losartan  (COZAAR ) 25 MG tablet Take 1 tablet (25 mg total) by mouth daily. 02/02/23   Saguier, [place], PA-C  losartan  (COZAAR ) 25 MG tablet Take 1 tablet (25 mg total) by mouth daily. 02/02/23   Saguier, [place], PA-C  losartan  (COZAAR ) 50 MG tablet Take 1 tablet (50 mg total) by mouth daily. 02/25/23   Saguier, [place], PA-C  metFORMIN  (GLUCOPHAGE ) 500 MG tablet Take 1 tablet (500 mg total) by mouth 2 (two) times daily with a meal. 02/26/23   Saguier, [place], PA-C  methocarbamol  (ROBAXIN ) 500 MG tablet Take 1 tablet (500 mg total) by mouth at bedtime as needed for muscle spasms 02/22/23     nabumetone  (RELAFEN ) 500 MG tablet Take 1 tablet (500 mg total) by mouth every 12 (twelve) hours as needed. 02/22/23     potassium chloride  SA (KLOR-CON  M) 20 MEQ tablet  Take 1 tablet (20 mEq total) by mouth 2 (two) times daily for 5 days. 03/21/23 03/26/23  Yolande Lamar BROCKS, MD  triamcinolone  (NASACORT ) 55 MCG/ACT AERO nasal inhaler Place 2 sprays into the nose daily. 06/20/23   Silver Wonda LABOR, PA      Allergies    Patient has no known allergies.    Review of Systems   Review of Systems  Constitutional:  Positive for fever.  All other systems reviewed and are negative.   Physical Exam Updated Vital Signs BP (!) 135/98   Pulse (!) 101   Temp 98.5 F (36.9 C) (Oral)   Resp 17   Wt 95.3 kg   SpO2 96%   BMI  30.13 kg/m  Physical Exam Vitals and nursing note reviewed.  Constitutional:      General: He is not in acute distress.    Appearance: He is well-developed.  HENT:     Head: Normocephalic and atraumatic.     Nose: Congestion present.  Eyes:     Conjunctiva/sclera: Conjunctivae normal.  Cardiovascular:     Rate and Rhythm: Normal rate and regular rhythm.     Heart sounds: No murmur heard. Pulmonary:     Effort: Pulmonary effort is normal. No respiratory distress.     Breath sounds: Rales present.     Comments: Rales auscultated right lower lung field. Abdominal:     Palpations: Abdomen is soft.     Tenderness: There is no abdominal tenderness.  Musculoskeletal:        General: No swelling.     Cervical back: Neck supple.     Right lower leg: No edema.     Left lower leg: No edema.  Skin:    General: Skin is warm and dry.     Capillary Refill: Capillary refill takes less than 2 seconds.  Neurological:     Mental Status: He is alert.  Psychiatric:        Mood and Affect: Mood normal.     ED Results / Procedures / Treatments   Labs (all labs ordered are listed, but only abnormal results are displayed) Labs Reviewed  RESP PANEL BY RT-PCR (RSV, FLU A&B, COVID)  [id number]    EKG None  Radiology DG Chest 2 View Result Date: 06/20/2023 CLINICAL DATA:  Cough, nasal and chest congestion, fever. EXAM: CHEST - 2 VIEW COMPARISON:  Chest radiograph dated 03/21/2023. FINDINGS: The heart size and mediastinal contours are within normal limits. Both lungs are clear. The visualized skeletal structures are unremarkable. IMPRESSION: No active cardiopulmonary disease. Electronically Signed   By: Norman Hopper M.D.   On: 06/20/2023 15:16    Procedures Procedures    Medications Ordered in ED Medications - No data to display  ED Course/ Medical Decision Making/ A&P                                 Medical Decision Making Amount and/or Complexity of Data Reviewed Radiology:  ordered.  Risk OTC drugs. Prescription drug management.   This patient presents to the ED for concern of cough, congestion, this involves an extensive number of treatment options, and is a complaint that carries with it a high risk of complications and morbidity.  The differential diagnosis includes, flu, RSV, pneumonia, sepsis, other   Co morbidities that complicate the patient evaluation  See HPI   Additional history obtained:  Additional history obtained from EMR External records from  outside source obtained and reviewed including hospital records   Lab Tests:  I Ordered, and personally interpreted labs.  The pertinent results include: Respiratory viral panel negative   Imaging Studies ordered:  I ordered imaging studies including chest x-ray I independently visualized and interpreted imaging which showed no acute cardiopulmonary abnormality. I agree with the radiologist interpretation   Cardiac Monitoring: / EKG:  The patient was maintained on a cardiac monitor.  I personally viewed and interpreted the cardiac monitored which showed an underlying rhythm of: Sinus rhythm   Consultations Obtained:  N/a   Problem List / ED Course / Critical interventions / Medication management  Cough, nasal congestion Reevaluation of the patient showed that the patient stayed the same I have reviewed the patients home medicines and have made adjustments as needed   Social Determinants of Health:  Former cigarette use.  Denies illicit drug use.   Test / Admission - Considered:  Cough, nasal congestion Vitals signs significant for initial tachycardia with a heart rate of 110 oh which decreased to within normal range with time elapsed administered on the emergency department.. Otherwise within normal range and stable throughout visit. Laboratory/imaging studies significant for: See above 48 year old male presents emergency department with complaints of cough, fever, chills.   Symptoms present for the past 2 weeks with acute worsening over the past few days.  Exam, patient with faint auscultatory rails right lower lung field.  Viral panel negative.  Chest x-ray without any obvious pneumonia, pneumothorax or other abnormality.  Suspect patient's symptoms likely secondary to pneumonia given clinical exam findings.  Will treat empirically with antibiotics for CAP and recommend additional symptomatic therapy as described in AVS.  Will recommend very close follow-up with PCP in the outpatient setting for reassessment.  Treatment plan discussed at length with patient and he acknowledged understanding was agreeable to said plan.  Patient overall well-appearing, afebrile not hypoxic in no acute distress. Worrisome signs and symptoms were discussed with the patient, and the patient acknowledged understanding to return to the ED if noticed. Patient was stable upon discharge.          Final Clinical Impression(s) / ED Diagnoses Final diagnoses:  Cough, unspecified type  Nasal congestion    Rx / DC Orders      Silver Wonda LABOR, PA 06/21/23 2340    Lenor Hollering, MD 06/23/23 [phone]

## 2023-06-20 NOTE — Discharge Instructions (Signed)
 As discussed, your viral testing was negative.  Chest x-ray without obvious pneumonia.  Given how your lungs out especially the right lower lung, will place you on empiric antibiotics for treatment of pneumonia.  Will also recommend nasal steroid spray in the formof Flonase as well as allergy medicine such as Zyrtec/Claritin/Allegra.  Please do not hesitate to return if the worrisome signs and symptoms we discussed become apparent.

## 2023-06-20 NOTE — ED Triage Notes (Signed)
 Fever and cold chills since Sunday night, productive cough. Denies any n/v/d

## 2023-06-21 ENCOUNTER — Other Ambulatory Visit (HOSPITAL_BASED_OUTPATIENT_CLINIC_OR_DEPARTMENT_OTHER): Payer: Self-pay

## 2023-07-10 ENCOUNTER — Ambulatory Visit: Payer: BC Managed Care – PPO | Admitting: Medical

## 2023-07-13 ENCOUNTER — Ambulatory Visit: Payer: BC Managed Care – PPO | Admitting: Medical

## 2023-07-18 ENCOUNTER — Ambulatory Visit: Payer: BC Managed Care – PPO | Admitting: Medical

## 2023-09-03 ENCOUNTER — Encounter: Payer: Self-pay | Admitting: Medical

## 2023-09-03 ENCOUNTER — Other Ambulatory Visit (HOSPITAL_BASED_OUTPATIENT_CLINIC_OR_DEPARTMENT_OTHER): Payer: Self-pay

## 2023-09-03 ENCOUNTER — Ambulatory Visit: Admitting: Medical

## 2023-09-03 VITALS — BP 148/88 | HR 86 | Temp 98.0°F | Resp 20 | Ht 70.0 in | Wt 212.0 lb

## 2023-09-03 DIAGNOSIS — I10 Essential (primary) hypertension: Secondary | ICD-10-CM

## 2023-09-03 DIAGNOSIS — G8929 Other chronic pain: Secondary | ICD-10-CM

## 2023-09-03 DIAGNOSIS — M25511 Pain in right shoulder: Secondary | ICD-10-CM | POA: Diagnosis not present

## 2023-09-03 DIAGNOSIS — F419 Anxiety disorder, unspecified: Secondary | ICD-10-CM | POA: Diagnosis not present

## 2023-09-03 DIAGNOSIS — F32A Depression, unspecified: Secondary | ICD-10-CM

## 2023-09-03 DIAGNOSIS — E119 Type 2 diabetes mellitus without complications: Secondary | ICD-10-CM | POA: Diagnosis not present

## 2023-09-03 LAB — COMPREHENSIVE METABOLIC PANEL
ALT: 32 U/L (ref 0–53)
AST: 28 U/L (ref 0–37)
Albumin: 4.5 g/dL (ref 3.5–5.2)
Alkaline Phosphatase: 82 U/L (ref 39–117)
BUN: 14 mg/dL (ref 6–23)
CO2: 31 meq/L (ref 19–32)
Calcium: 9.7 mg/dL (ref 8.4–10.5)
Chloride: 102 meq/L (ref 96–112)
Creatinine, Ser: 1.11 mg/dL (ref 0.40–1.50)
GFR: 80.32 mL/min (ref >60.00)
Glucose, Bld: 122 mg/dL — ABNORMAL HIGH (ref 70–99)
Potassium: 3.5 meq/L (ref 3.5–5.1)
Sodium: 140 meq/L (ref 135–145)
Total Bilirubin: 0.5 mg/dL (ref 0.2–1.2)
Total Protein: 7.4 g/dL (ref 6.0–8.3)

## 2023-09-03 LAB — GLUCOSE, POCT (MANUAL RESULT ENTRY): POC Glucose: 133 mg/dL — AB (ref 70–99)

## 2023-09-03 LAB — HEMOGLOBIN A1C: Hgb A1c MFr Bld: 9 % — ABNORMAL HIGH (ref 4.6–6.5)

## 2023-09-03 MED ORDER — OZEMPIC (0.25 OR 0.5 MG/DOSE) 2 MG/3ML ~~LOC~~ SOPN
0.2500 mg | PEN_INJECTOR | SUBCUTANEOUS | 0 refills | Status: DC
  Filled 2023-09-03: qty 3, 56d supply, fill #0

## 2023-09-03 MED ORDER — LOSARTAN POTASSIUM 100 MG PO TABS
100.0000 mg | ORAL_TABLET | Freq: Every day | ORAL | 3 refills | Status: DC
  Filled 2023-09-03: qty 90, 90d supply, fill #0

## 2023-09-03 MED ORDER — CITALOPRAM HYDROBROMIDE 10 MG PO TABS
10.0000 mg | ORAL_TABLET | Freq: Every day | ORAL | 0 refills | Status: DC
  Filled 2023-09-03: qty 30, 30d supply, fill #0

## 2023-09-03 NOTE — Patient Instructions (Addendum)
 Type 2 Diabetes Mellitus A1c at 8.9% indicates poor control. Interested in Ozempic due to family success.  - Prescribe Ozempic 0.25 mg pending insurance approval. - Discontinue glipizide upon initiation of Ozempic. - Check A1c and metabolic panels including kidney function.  Hypertension . Concerns about NSAID impact on kidney function and blood pressure. - Increase losartan to 100 mg daily. - Schedule a nurse blood pressure check in one week.  Chronic Shoulder Pain Persistent pain post-surgery. Concern about NSAIDs' impact on kidney function and blood pressure. - Check kidney function. - Advise taking Tylenol 650 mg with ibuprofen 200 mg as needed for pain.(avoid high dose nsaids) If cmp shows decrease kidney function need to avoid nsaids all together. - Hold referral to sports medicine until after follow-up with orthopedist on March 24.  Anxiety and depression GAD-7 score of 13 and PHQ-9 score of 11 indicate moderate anxiety and depression. Chose Celexa. Rx advisement given. - Prescribe Celexa 10 mg daily. - Follow up in one month to assess response to Celexa.  General Health Maintenance Family history of dialysis. Slightly elevated creatinine in October. Monitor kidney function due to diabetes, hypertension, and NSAID use. - Check kidney function and potassium levels.

## 2023-09-03 NOTE — Progress Notes (Signed)
 Subjective:    Patient ID: Brett Allen, male    DOB: [birth year], 48 y.o.   MRN: [id number]  HPI  Discussed the use of AI scribe software for clinical note transcription with the patient, who gave verbal consent to proceed.  History of Present Illness   DRAGON THRUSH is a 48 year old male with diabetes and hypertension who presents with elevated blood sugar levels and shoulder pain.  He has a history of type 2 diabetes mellitus with recent blood sugar levels indicating poor glycemic control. His fasting blood glucose levels are around 133 mg/dL, and postprandial levels rise to 187 mg/dL. His last recorded A1c in October was 8.9%. He is currently taking glipizide 5 mg daily. 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 Post-surgery, his blood sugar levels remain elevated. He has a family history of diabetes, as his mother is diabetic and on medication.   No fh of thyroid cancer. No hx of pancreatitis.  He experiences persistent shoulder pain following a dislocation and fracture in August, for which he underwent surgery on November 26. The pain is described as throbbing, especially upon waking, and is not fully relieved by ibuprofen 400 mg, which he takes once or twice daily. He attends physical therapy two to three times a week and has a follow-up appointment with his orthopedist on March 24.  He experiences significant anxiety, which has been ongoing for several years, and was previously diagnosed by [facility] at [facility]. He recalls being on medication in the past but discontinued it due to side effects. His GAD-7 score is 13, indicating anxiety, and his PHQ-9 score is 11, indicating depression.   He can't remember side effects but this was minimal since he can't remembers. Speculates maybe sertraline.  He has a history of hypertension, currently managed with losartan 50 mg  daily. No history of pancreatitis and no family history of thyroid cancer. His kidney function was last checked in October, showing a creatinine level of 1.25 mg/dL and a GFR over 60, with low potassium levels for which he was given potassium pills. His mother is diabetic and currently on dialysis.        Past Medical History:  Diagnosis Date   Back pain    lower   Cellulitis    Diabetes mellitus without complication (HCC)    Hypertension    Sarcoidosis    right eye     Social History   Socioeconomic History   Marital status: Married    Spouse name: Not on file   Number of children: Not on file   Years of education: Not on file   Highest education level: 12th grade  Occupational History   Not on file  Tobacco Use   Smoking status: Former    Types: Cigarettes   Smokeless tobacco: Never  Vaping Use   Vaping status: Never Used  Substance and Sexual Activity   Alcohol use: Not Currently    Comment: occassionally    Drug use: No   Sexual activity: Yes    Partners: Female    Comment: married 18 years.  Other Topics Concern   Not on file  Social History Narrative   Not on file   Social Drivers of Health   Financial Resource Strain: Low Risk  (07/09/2023)   Overall Financial Resource Strain (CARDIA)    Difficulty of Paying Living Expenses: Not very  hard  Food Insecurity: Food Insecurity Present (07/09/2023)   Hunger Vital Sign    Worried About Running Out of Food in the Last Year: Never true    Ran Out of Food in the Last Year: Sometimes true  Transportation Needs: No Transportation Needs (07/09/2023)   PRAPARE - Administrator, Civil Service (Medical): No    Lack of Transportation (Non-Medical): No  Physical Activity: Insufficiently Active (07/09/2023)   Exercise Vital Sign    Days of Exercise per Week: 2 days    Minutes of Exercise per Session: 30 min  Stress: No Stress Concern Present (07/09/2023)   [employer] of Occupational Health - Occupational  Stress Questionnaire    Feeling of Stress : Not at all  Social Connections: Moderately Integrated (07/09/2023)   Social Connection and Isolation Panel [NHANES]    Frequency of Communication with Friends and Family: Twice a week    Frequency of Social Gatherings with Friends and Family: Once a week    Attends Religious Services: More than 4 times per year    Active Member of [employer] or Organizations: No    Attends Banker Meetings: Not on file    Marital Status: Married  Catering manager Violence: Not At Risk (10/29/2022)   Humiliation, Afraid, Rape, and Kick questionnaire    Fear of Current or Ex-Partner: No    Emotionally Abused: No    Physically Abused: No    Sexually Abused: No    Past Surgical History:  Procedure Laterality Date   left wrsit surgery Left    WRIST FRACTURE SURGERY Left 2017    Family History  Problem Relation Age of Onset   Hypertension Mother    Diabetes Mother    Hypertension Brother     No Known Allergies  Current Outpatient Medications on File Prior to Visit  Medication Sig Dispense Refill   atorvastatin (LIPITOR) 10 MG tablet Take 1 tablet (10 mg total) by mouth daily. 30 tablet 11   Blood Glucose Monitoring Suppl DEVI 1 each by Does not apply route in the morning, at noon, and at bedtime. May substitute to any manufacturer covered by patient's insurance. 1 each 0   canagliflozin (INVOKANA) 100 MG TABS tablet Take 1 tablet (100 mg total) by mouth daily before breakfast. 30 tablet 3   famciclovir (FAMVIR) 500 MG tablet Take 1 tablet (500 mg total) by mouth 3 (three) times daily. 21 tablet 1   gabapentin (NEURONTIN) 100 MG capsule Take 1 capsule (100 mg total) by mouth 3 (three) times daily. 270 capsule 0   metFORMIN (GLUCOPHAGE) 500 MG tablet Take 1 tablet (500 mg total) by mouth 2 (two) times daily with a meal. 60 tablet 11   methocarbamol (ROBAXIN) 500 MG tablet Take 1 tablet (500 mg total) by mouth at bedtime as needed for muscle spasms 15  tablet 0   potassium chloride SA (KLOR-CON M) 20 MEQ tablet Take 1 tablet (20 mEq total) by mouth 2 (two) times daily for 5 days. 10 tablet 0   triamcinolone (NASACORT) 55 MCG/ACT AERO nasal inhaler Place 2 sprays into the nose daily. 1 each 12   No current facility-administered medications on file prior to visit.    BP (!) 148/88   Pulse 86   Temp 98 F (36.7 C)   Resp 20   Ht 5\' 10"  (1.778 m)   Wt 212 lb (96.2 kg)   SpO2 99%   BMI 30.42 kg/m  Review of Systems  Constitutional:  Negative for chills and fatigue.  HENT:  Negative for congestion and ear discharge.   Respiratory:  Negative for chest tightness, shortness of breath and wheezing.   Cardiovascular:  Negative for chest pain and palpitations.  Gastrointestinal:  Negative for abdominal distention, abdominal pain and blood in stool.  Genitourinary:  Negative for dysuria, flank pain and frequency.  Musculoskeletal:  Negative for back pain and neck pain.       Rt shoulder pain  Skin:  Negative for rash.  Neurological:  Negative for dizziness, speech difficulty, weakness and light-headedness.  Hematological:  Negative for adenopathy. Does not bruise/bleed easily.  Psychiatric/Behavioral:  Positive for dysphoric mood. Negative for behavioral problems, decreased concentration and suicidal ideas. The patient is nervous/anxious.     Past Medical History:  Diagnosis Date   Back pain    lower   Cellulitis    Diabetes mellitus without complication (HCC)    Hypertension    Sarcoidosis    right eye     Social History   Socioeconomic History   Marital status: Married    Spouse name: Not on file   Number of children: Not on file   Years of education: Not on file   Highest education level: 12th grade  Occupational History   Not on file  Tobacco Use   Smoking status: Former    Types: Cigarettes   Smokeless tobacco: Never  Vaping Use   Vaping status: Never Used  Substance and Sexual Activity   Alcohol use:  Not Currently    Comment: occassionally    Drug use: No   Sexual activity: Yes    Partners: Female    Comment: married 18 years.  Other Topics Concern   Not on file  Social History Narrative   Not on file   Social Drivers of Health   Financial Resource Strain: Low Risk  (07/09/2023)   Overall Financial Resource Strain (CARDIA)    Difficulty of Paying Living Expenses: Not very hard  Food Insecurity: Food Insecurity Present (07/09/2023)   Hunger Vital Sign    Worried About Running Out of Food in the Last Year: Never true    Ran Out of Food in the Last Year: Sometimes true  Transportation Needs: No Transportation Needs (07/09/2023)   PRAPARE - Administrator, Civil Service (Medical): No    Lack of Transportation (Non-Medical): No  Physical Activity: Insufficiently Active (07/09/2023)   Exercise Vital Sign    Days of Exercise per Week: 2 days    Minutes of Exercise per Session: 30 min  Stress: No Stress Concern Present (07/09/2023)   [employer] of Occupational Health - Occupational Stress Questionnaire    Feeling of Stress : Not at all  Social Connections: Moderately Integrated (07/09/2023)   Social Connection and Isolation Panel [NHANES]    Frequency of Communication with Friends and Family: Twice a week    Frequency of Social Gatherings with Friends and Family: Once a week    Attends Religious Services: More than 4 times per year    Active Member of [employer] or Organizations: No    Attends Banker Meetings: Not on file    Marital Status: Married  Catering manager Violence: Not At Risk (10/29/2022)   Humiliation, Afraid, Rape, and Kick questionnaire    Fear of Current or Ex-Partner: No    Emotionally Abused: No    Physically Abused: No    Sexually Abused: No    Past  Surgical History:  Procedure Laterality Date   left wrsit surgery Left    WRIST FRACTURE SURGERY Left 2017    Family History  Problem Relation Age of Onset   Hypertension Mother     Diabetes Mother    Hypertension Brother     No Known Allergies  Current Outpatient Medications on File Prior to Visit  Medication Sig Dispense Refill   atorvastatin (LIPITOR) 10 MG tablet Take 1 tablet (10 mg total) by mouth daily. 30 tablet 11   Blood Glucose Monitoring Suppl DEVI 1 each by Does not apply route in the morning, at noon, and at bedtime. May substitute to any manufacturer covered by patient's insurance. 1 each 0   canagliflozin (INVOKANA) 100 MG TABS tablet Take 1 tablet (100 mg total) by mouth daily before breakfast. 30 tablet 3   famciclovir (FAMVIR) 500 MG tablet Take 1 tablet (500 mg total) by mouth 3 (three) times daily. 21 tablet 1   gabapentin (NEURONTIN) 100 MG capsule Take 1 capsule (100 mg total) by mouth 3 (three) times daily. 270 capsule 0   losartan (COZAAR) 25 MG tablet Take 1 tablet (25 mg total) by mouth daily. 30 tablet 3   losartan (COZAAR) 25 MG tablet Take 1 tablet (25 mg total) by mouth daily. 30 tablet 1   losartan (COZAAR) 25 MG tablet Take 1 tablet (25 mg total) by mouth daily. 30 tablet 1   losartan (COZAAR) 50 MG tablet Take 1 tablet (50 mg total) by mouth daily. 90 tablet 1   metFORMIN (GLUCOPHAGE) 500 MG tablet Take 1 tablet (500 mg total) by mouth 2 (two) times daily with a meal. 60 tablet 11   methocarbamol (ROBAXIN) 500 MG tablet Take 1 tablet (500 mg total) by mouth at bedtime as needed for muscle spasms 15 tablet 0   nabumetone (RELAFEN) 500 MG tablet Take 1 tablet (500 mg total) by mouth every 12 (twelve) hours as needed. 30 tablet 1   potassium chloride SA (KLOR-CON M) 20 MEQ tablet Take 1 tablet (20 mEq total) by mouth 2 (two) times daily for 5 days. 10 tablet 0   triamcinolone (NASACORT) 55 MCG/ACT AERO nasal inhaler Place 2 sprays into the nose daily. 1 each 12   No current facility-administered medications on file prior to visit.    BP (!) 148/88   Pulse 86   Temp 98 F (36.7 C)   Resp 20   Ht 5\' 10"  (1.778 m)   Wt 212 lb (96.2 kg)    SpO2 99%   BMI 30.42 kg/m        Objective:   Physical Exam  General Mental Status- Alert. General Appearance- Not in acute distress.     Neck No JVD.  Chest and Lung Exam Auscultation: Breath Sounds:-CTA  Cardiovascular Auscultation:Rythm- RRR Murmurs & Other Heart Sounds:Auscultation of the heart reveals- No Murmurs.  Abdomen Inspection:-Inspeection Normal. Palpation/Percussion:Note:No mass. Palpation and Percussion of the abdomen reveal- Non Tender, Non Distended + BS, no rebound or guarding.   Neurologic Cranial Nerve exam:- CN III-XII intact(No nystagmus), symmetric smile. Strength:- 5/5 equal and symmetric strength both upper and lower extremities.       Assessment & Plan:   Assessment and Plan    Patient Instructions  Type 2 Diabetes Mellitus A1c at 8.9% indicates poor control. Interested in Ozempic due to family success.  - Prescribe Ozempic 0.25 mg pending insurance approval. - Discontinue glipizide upon initiation of Ozempic. - Check A1c and metabolic panels including kidney function.  Hypertension . Concerns about NSAID impact on kidney function and blood pressure. - Increase losartan to 100 mg daily. - Schedule a nurse blood pressure check in one week.  Chronic Shoulder Pain Persistent pain post-surgery. Concern about NSAIDs' impact on kidney function and blood pressure. - Check kidney function. - Advise taking Tylenol 650 mg with ibuprofen 200 mg as needed for pain.(avoid high dose nsaids) If cmp shows decrease kidney function need to avoid nsaids all together. - Hold referral to sports medicine until after follow-up with orthopedist on March 24.  Anxiety and depression GAD-7 score of 13 and PHQ-9 score of 11 indicate moderate anxiety and depression. Chose Celexa. Rx advisement given. - Prescribe Celexa 10 mg daily. - Follow up in one month to assess response to Celexa.  General Health Maintenance Family history of dialysis. Slightly  elevated creatinine in October. Monitor kidney function due to diabetes, hypertension, and NSAID use. - Check kidney function and potassium levels.   Esperanza Richters, PA-C

## 2023-09-04 ENCOUNTER — Other Ambulatory Visit (HOSPITAL_BASED_OUTPATIENT_CLINIC_OR_DEPARTMENT_OTHER): Payer: Self-pay

## 2023-09-04 MED ORDER — TIRZEPATIDE 2.5 MG/0.5ML ~~LOC~~ SOAJ
2.5000 mg | SUBCUTANEOUS | 0 refills | Status: DC
Start: 2023-09-04 — End: 2023-10-08
  Filled 2023-09-04: qty 2, 28d supply, fill #0

## 2023-09-04 NOTE — Addendum Note (Signed)
 Addended by: Gwenevere Abbot on: 09/04/2023 09:27 PM   Modules accepted: Orders

## 2023-09-05 ENCOUNTER — Other Ambulatory Visit (HOSPITAL_COMMUNITY): Payer: Self-pay

## 2023-09-05 ENCOUNTER — Other Ambulatory Visit (HOSPITAL_BASED_OUTPATIENT_CLINIC_OR_DEPARTMENT_OTHER): Payer: Self-pay

## 2023-09-05 ENCOUNTER — Telehealth: Payer: Self-pay | Admitting: Pharmacy Technician

## 2023-09-05 NOTE — Telephone Encounter (Signed)
 Pharmacy Patient Advocate Encounter  Received notification from [facility] that Prior Authorization for [facility] 2.5MG /0.5ML auto-injectors has been APPROVED from 09/05/2023 to 09/04/2024. Ran test claim, Copay is $24.99. This test claim was processed through [facility]- copay amounts may vary at other pharmacies due to pharmacy/plan contracts, or as the patient moves through the different stages of their insurance plan.   PA #/Case ID/Reference #: [id number]

## 2023-09-05 NOTE — Telephone Encounter (Signed)
 Pharmacy Patient Advocate Encounter   Received notification from CoverMyMeds that prior authorization for [facility] 2.5MG /0.5ML auto-injectors is required/requested.   Insurance verification completed.   The patient is insured through [employer] .   Per test claim: PA required; PA submitted to above mentioned insurance via CoverMyMeds Key/confirmation #/EOC [id number] Status is pending

## 2023-09-06 ENCOUNTER — Other Ambulatory Visit (HOSPITAL_COMMUNITY): Payer: Self-pay

## 2023-09-10 ENCOUNTER — Other Ambulatory Visit (HOSPITAL_BASED_OUTPATIENT_CLINIC_OR_DEPARTMENT_OTHER): Payer: Self-pay

## 2023-09-10 MED ORDER — MELOXICAM 15 MG PO TABS
15.0000 mg | ORAL_TABLET | Freq: Every day | ORAL | 0 refills | Status: DC
  Filled 2023-09-10: qty 60, 60d supply, fill #0

## 2023-09-10 NOTE — Progress Notes (Deleted)
 Pt here for Blood pressure check per ES at 09/03/23 OV ( BP was 14/88). "Hypertension . Concerns about NSAID impact on kidney function and blood pressure. - Increase losartan to 100 mg daily. - Schedule a nurse blood pressure check in one week."  Pt currently takes: Losartan 100 mg  Pt reports compliance with medication.  BP today @ = HR =  Pt advised per ES:  (56m f/u scheduled for 10/04/23)

## 2023-09-11 ENCOUNTER — Ambulatory Visit

## 2023-09-11 ENCOUNTER — Other Ambulatory Visit (HOSPITAL_BASED_OUTPATIENT_CLINIC_OR_DEPARTMENT_OTHER): Payer: Self-pay

## 2023-09-11 DIAGNOSIS — I10 Essential (primary) hypertension: Secondary | ICD-10-CM

## 2023-09-20 ENCOUNTER — Telehealth: Payer: Self-pay | Admitting: *Deleted

## 2023-09-20 NOTE — Telephone Encounter (Signed)
 Patient was identified as falling into the [place] Measure - Diabetes.   Patient was: Requires a call back at a later time.  Last A1c completed on 09/03/23.

## 2023-10-04 ENCOUNTER — Ambulatory Visit: Admitting: Medical

## 2023-10-08 ENCOUNTER — Other Ambulatory Visit (HOSPITAL_BASED_OUTPATIENT_CLINIC_OR_DEPARTMENT_OTHER): Payer: Self-pay

## 2023-10-08 ENCOUNTER — Other Ambulatory Visit: Payer: Self-pay

## 2023-10-08 ENCOUNTER — Ambulatory Visit (INDEPENDENT_AMBULATORY_CARE_PROVIDER_SITE_OTHER): Admitting: Medical

## 2023-10-08 ENCOUNTER — Encounter: Payer: Self-pay | Admitting: Medical

## 2023-10-08 VITALS — BP 140/90 | HR 93 | Resp 18 | Ht 70.0 in | Wt 212.0 lb

## 2023-10-08 DIAGNOSIS — I10 Essential (primary) hypertension: Secondary | ICD-10-CM | POA: Diagnosis not present

## 2023-10-08 DIAGNOSIS — E119 Type 2 diabetes mellitus without complications: Secondary | ICD-10-CM

## 2023-10-08 DIAGNOSIS — M25511 Pain in right shoulder: Secondary | ICD-10-CM | POA: Diagnosis not present

## 2023-10-08 DIAGNOSIS — Z7984 Long term (current) use of oral hypoglycemic drugs: Secondary | ICD-10-CM

## 2023-10-08 DIAGNOSIS — F419 Anxiety disorder, unspecified: Secondary | ICD-10-CM | POA: Diagnosis not present

## 2023-10-08 DIAGNOSIS — F32A Depression, unspecified: Secondary | ICD-10-CM

## 2023-10-08 DIAGNOSIS — G8929 Other chronic pain: Secondary | ICD-10-CM

## 2023-10-08 MED ORDER — TIRZEPATIDE 5 MG/0.5ML ~~LOC~~ SOAJ
5.0000 mg | SUBCUTANEOUS | 0 refills | Status: DC
  Filled 2023-10-08: qty 6, 84d supply, fill #0

## 2023-10-08 MED ORDER — VALSARTAN 160 MG PO TABS
160.0000 mg | ORAL_TABLET | Freq: Every day | ORAL | 3 refills | Status: DC
  Filled 2023-10-08: qty 90, 90d supply, fill #0
  Filled 2023-10-10: qty 30, 30d supply, fill #0
  Filled 2023-11-04: qty 30, 30d supply, fill #1

## 2023-10-08 MED ORDER — CITALOPRAM HYDROBROMIDE 20 MG PO TABS
20.0000 mg | ORAL_TABLET | Freq: Every day | ORAL | 3 refills | Status: DC
  Filled 2023-10-08: qty 30, 30d supply, fill #0

## 2023-10-08 NOTE — Progress Notes (Signed)
 Subjective:    Patient ID: Brett Allen, male    DOB: 07/18/1975, 48 y.o.   MRN: [id number]  HPI Pt in for follow up.   Last visit was 09-03-2023.  " Type 2 Diabetes Mellitus A1c at 8.9% indicates poor control. Interested in Ozempic  due to family success.  - Prescribe Ozempic  0.25 mg pending insurance approval. - Discontinue glipizide  upon initiation of Ozempic . - Check A1c and metabolic panels including kidney function.   Hypertension . Concerns about NSAID impact on kidney function and blood pressure. - Increase losartan  to 100 mg daily. - Schedule a nurse blood pressure check in one week.   Chronic Shoulder Pain Persistent pain post-surgery. Concern about NSAIDs' impact on kidney function and blood pressure. - Check kidney function. - Advise taking Tylenol  650 mg with ibuprofen  200 mg as needed for pain.(avoid high dose nsaids) If cmp shows decrease kidney function need to avoid nsaids all together. - Hold referral to sports medicine until after follow-up with orthopedist on March 24.   Anxiety and depression. GAD-7 score of 13 and PHQ-9 score of 11 indicate moderate anxiety and depression. Chose Celexa . Rx advisement given. - Prescribe Celexa  10 mg daily. - Follow up in one month to assess response to Celexa .   General Health Maintenance Family history of dialysis. Slightly elevated creatinine in October. Monitor kidney function due to diabetes, hypertension, and NSAID use. - Check kidney function and potassium levels.  "  Brett Allen is a 48 year old male with type 2 diabetes and hypertension who presents for follow-up on his diabetes management and blood pressure control.  He has type 2 diabetes with a recent HbA1c of 9.0%. He was previously on glipizide , which he has discontinued, and is currently on Mounjaro  injections. His blood glucose levels have remained stable, with readings around 120-125 mg/dL, checked approximately four times a week in the morning.  He is not on Invokana  or any other oral diabetes medications. He hopes to increase his Mounjaro  dose to improve his blood sugar control. No hypoglycemic episodes have been reported.  He has hypertension and is currently taking losartan  100 mg daily. Home blood pressure readings are around 140/90 mmHg. He follows a low-salt diet and tries to exercise daily. He is almost out of his current medication supply.  He is on Celexa  for mood management, with his family noticing he is calmer. He experiences mild drowsiness as a side effect but no other adverse effects. He is currently on a 10 mg dose. He is willing to use  higher dose as he wants mood to be better.  He has ongoing shoulder pain for which he was prescribed meloxicam  by his orthopedist. The medication has not been effective in managing his pain, which is particularly bothersome in the morning.     Review of Systems  Constitutional:  Negative for chills, fatigue and fever.  HENT:  Negative for congestion, ear discharge and ear pain.   Respiratory:  Negative for cough, chest tightness, shortness of breath and wheezing.   Cardiovascular:  Negative for chest pain and palpitations.  Gastrointestinal:  Negative for abdominal pain.  Genitourinary:  Negative for decreased urine volume, dysuria, frequency, hematuria and testicular pain.  Musculoskeletal:  Negative for back pain and joint swelling.       Shoulder pain.  Neurological:  Negative for dizziness, syncope, weakness and light-headedness.  Hematological:  Negative for adenopathy. Does not bruise/bleed easily.  Psychiatric/Behavioral:  Negative for behavioral problems and decreased concentration.  Past Medical History:  Diagnosis Date   Back pain    lower   Cellulitis    Diabetes mellitus without complication (HCC)    Hypertension    Sarcoidosis    right eye     Social History   Socioeconomic History   Marital status: Married    Spouse name: Not on file   Number of  children: Not on file   Years of education: Not on file   Highest education level: 12th grade  Occupational History   Not on file  Tobacco Use   Smoking status: Former    Types: Cigarettes   Smokeless tobacco: Never  Vaping Use   Vaping status: Never Used  Substance and Sexual Activity   Alcohol use: Not Currently    Comment: occassionally    Drug use: No   Sexual activity: Yes    Partners: Female    Comment: married 18 years.  Other Topics Concern   Not on file  Social History Narrative   Not on file   Social Drivers of Health   Financial Resource Strain: Low Risk  (07/09/2023)   Overall Financial Resource Strain (CARDIA)    Difficulty of Paying Living Expenses: Not very hard  Food Insecurity: Food Insecurity Present (07/09/2023)   Hunger Vital Sign    Worried About Running Out of Food in the Last Year: Never true    Ran Out of Food in the Last Year: Sometimes true  Transportation Needs: No Transportation Needs (07/09/2023)   PRAPARE - Administrator, Civil Service (Medical): No    Lack of Transportation (Non-Medical): No  Physical Activity: Insufficiently Active (07/09/2023)   Exercise Vital Sign    Days of Exercise per Week: 2 days    Minutes of Exercise per Session: 30 min  Stress: No Stress Concern Present (07/09/2023)   [employer] of Occupational Health - Occupational Stress Questionnaire    Feeling of Stress : Not at all  Social Connections: Moderately Integrated (07/09/2023)   Social Connection and Isolation Panel [NHANES]    Frequency of Communication with Friends and Family: Twice a week    Frequency of Social Gatherings with Friends and Family: Once a week    Attends Religious Services: More than 4 times per year    Active Member of [employer] or Organizations: No    Attends Banker Meetings: Not on file    Marital Status: Married  Catering manager Violence: Not At Risk (10/29/2022)   Humiliation, Afraid, Rape, and Kick questionnaire     Fear of Current or Ex-Partner: No    Emotionally Abused: No    Physically Abused: No    Sexually Abused: No    Past Surgical History:  Procedure Laterality Date   left wrsit surgery Left    WRIST FRACTURE SURGERY Left 2017    Family History  Problem Relation Age of Onset   Hypertension Mother    Diabetes Mother    Hypertension Brother     No Known Allergies  Current Outpatient Medications on File Prior to Visit  Medication Sig Dispense Refill   atorvastatin  (LIPITOR) 10 MG tablet Take 1 tablet (10 mg total) by mouth daily. 30 tablet 11   Blood Glucose Monitoring Suppl DEVI 1 each by Does not apply route in the morning, at noon, and at bedtime. May substitute to any manufacturer covered by patient's insurance. 1 each 0   famciclovir  (FAMVIR ) 500 MG tablet Take 1 tablet (500 mg total) by mouth 3 (  three) times daily. 21 tablet 1   gabapentin  (NEURONTIN ) 100 MG capsule Take 1 capsule (100 mg total) by mouth 3 (three) times daily. 270 capsule 0   meloxicam  (MOBIC ) 15 MG tablet Take 1 tablet (15 mg total) by mouth daily as directed for 10-14 days, then as needed. 60 tablet 0   methocarbamol  (ROBAXIN ) 500 MG tablet Take 1 tablet (500 mg total) by mouth at bedtime as needed for muscle spasms 15 tablet 0   potassium chloride  SA (KLOR-CON  M) 20 MEQ tablet Take 1 tablet (20 mEq total) by mouth 2 (two) times daily for 5 days. 10 tablet 0   triamcinolone  (NASACORT ) 55 MCG/ACT AERO nasal inhaler Place 2 sprays into the nose daily. 1 each 12   No current facility-administered medications on file prior to visit.    BP (!) 140/90   Pulse 93   Resp 18   Ht 5\' 10"  (1.778 m)   Wt 212 lb (96.2 kg)   SpO2 95%   BMI 30.42 kg/m        Objective:   Physical Exam  General Mental Status- Alert. General Appearance- Not in acute distress.   Skin General: Color- Normal Color. Moisture- Normal Moisture.  Neck Carotid Arteries- Normal color. Moisture- Normal Moisture. No carotid bruits.  No JVD.  Chest and Lung Exam Auscultation: Breath Sounds:-Normal.  Cardiovascular Auscultation:Rythm- RRR Murmurs & Other Heart Sounds:Auscultation of the heart reveals- No Murmurs.  Abdomen Inspection:-CTA Palpation/Percussion:Note:No mass. Palpation and Percussion of the abdomen reveal- Non Tender, Non Distended + BS, no rebound or guarding.  Neurologic Cranial Nerve exam:- CN III-XII intact(No nystagmus), symmetric smile. Strength:- 5/5 equal and symmetric strength both upper and lower extremities.       Assessment & Plan:   Patient Instructions  Hypertension Hypertension managed with losartan  100 mg daily, home readings 140/90 mmHg. Meloxicam  may elevate blood pressure. Considering valsartan  160 mg. - Prescribe valsartan  160 mg daily, discontinue losartan  100 mg. - Advise low-sodium diet and regular exercise. - Monitor blood pressure over 1-2 weeks.  Type 2 diabetes mellitus Type 2 diabetes with A1c 9.0, managed with Mounjaro . Blood glucose 120-125 mg/dL. Transitioning to higher Mounjaro  dose. - Prescribe Mounjaro  5 mg injection weekly, three-month supply. - Advise regular blood glucose monitoring. - Follow up in three months.  Depression Depression managed with Celexa  10 mg, improved mood, mild drowsiness. Open to dosage increase. - Increase Celexa  to 20 mg daily. - Monitor mood, report changes in three weeks.  Shoulder pain Chronic shoulder pain managed by orthopedist. Meloxicam  ineffective, may elevate blood pressure. - Continue meloxicam  as prescribed by orthopedist. - Follow orthopedist's advice, continue physical therapy. - Monitor blood pressure due to meloxicam .  Follow up 3 months or sooner if needed (ask you give me my chart uddate on bp, sugar levels and mood in 3 weeks)   Sylvia Everts, PA-C

## 2023-10-08 NOTE — Patient Instructions (Signed)
 Hypertension Hypertension managed with losartan  100 mg daily, home readings 140/90 mmHg. Meloxicam  may elevate blood pressure. Considering valsartan  160 mg. - Prescribe valsartan  160 mg daily, discontinue losartan  100 mg. - Advise low-sodium diet and regular exercise. - Monitor blood pressure over 1-2 weeks.  Type 2 diabetes mellitus Type 2 diabetes with A1c 9.0, managed with Mounjaro . Blood glucose 120-125 mg/dL. Transitioning to higher Mounjaro  dose. - Prescribe Mounjaro  5 mg injection weekly, three-month supply. - Advise regular blood glucose monitoring. - Follow up in three months.  Depression Depression managed with Celexa  10 mg, improved mood, mild drowsiness. Open to dosage increase. - Increase Celexa  to 20 mg daily. - Monitor mood, report changes in three weeks.  Shoulder pain Chronic shoulder pain managed by orthopedist. Meloxicam  ineffective, may elevate blood pressure. - Continue meloxicam  as prescribed by orthopedist. - Follow orthopedist's advice, continue physical therapy. - Monitor blood pressure due to meloxicam .  Follow up 3 months or sooner if needed (ask you give me my chart uddate on bp, sugar levels and mood in 3 weeks)

## 2023-10-10 ENCOUNTER — Other Ambulatory Visit (HOSPITAL_BASED_OUTPATIENT_CLINIC_OR_DEPARTMENT_OTHER): Payer: Self-pay

## 2023-10-19 ENCOUNTER — Ambulatory Visit: Payer: Self-pay

## 2023-10-19 ENCOUNTER — Ambulatory Visit: Admitting: Medical

## 2023-10-19 ENCOUNTER — Telehealth: Payer: Self-pay

## 2023-10-19 ENCOUNTER — Ambulatory Visit (INDEPENDENT_AMBULATORY_CARE_PROVIDER_SITE_OTHER): Admitting: Medical

## 2023-10-19 ENCOUNTER — Other Ambulatory Visit (HOSPITAL_BASED_OUTPATIENT_CLINIC_OR_DEPARTMENT_OTHER): Payer: Self-pay

## 2023-10-19 VITALS — BP 170/98 | HR 72 | Temp 98.0°F | Resp 18 | Ht 70.0 in | Wt 214.8 lb

## 2023-10-19 DIAGNOSIS — I10 Essential (primary) hypertension: Secondary | ICD-10-CM

## 2023-10-19 DIAGNOSIS — F32A Depression, unspecified: Secondary | ICD-10-CM | POA: Diagnosis not present

## 2023-10-19 DIAGNOSIS — F419 Anxiety disorder, unspecified: Secondary | ICD-10-CM

## 2023-10-19 MED ORDER — CLONAZEPAM 0.5 MG PO TABS
0.5000 mg | ORAL_TABLET | Freq: Two times a day (BID) | ORAL | 0 refills | Status: DC | PRN
  Filled 2023-10-19: qty 4, 2d supply, fill #0

## 2023-10-19 NOTE — Patient Instructions (Signed)
 Hypertension(near urgent range and approaching weekend) Hypertension with recent elevated readings likely due to missed valsartan  doses. Current BP 170/98. Reports occipital pressure, no neurological symptoms. Normal neurologic exam. - Increase valsartan  to 320 mg daily. - Obtain blood pressure cuff for home monitoring. - Check BP tomorrow afternoon and Sunday. - Update provider with readings on Monday. - Avoid caffeine and NSAIDs. - Hold physical therapy until BP is controlled. -if ever neuro motor or sensory deficit then be seen in ED. Any cardiac signs/symptom be seen in ED as well.  Anxiety and depression Depression managed with Celexa  20 mg daily. Clonazepam  for severe panic attacks or stress. - Continue Celexa  20 mg daily. - Prescribe clonazepam  0.5 mg for severe panic attacks or stress, use sporadically. - Avoid operating heavy machinery when using clonazepam .  Ear discomfort Left ear discomfort possibly due to eustachian tube pressure related to allergy season. - Prescribe Flonase nasal spray to decrease eustachian tube pressure.  Follow up date to be determined after bp update on monday

## 2023-10-19 NOTE — Progress Notes (Unsigned)
 Subjective:    Patient ID: Brett Allen, male    DOB: [birth year], 48 y.o.   MRN: [id number]  HPI Brett Allen is a 48 year old male with hypertension who presents with elevated blood pressure readings.  He has experienced elevated blood pressure readings, with a recent measurement of 153/130 mmHg. He missed his blood pressure medication, valsartan  160 mg, on Monday and Tuesday but resumed taking it on Wednesday. His blood pressure was previously recorded at 140/90 mmHg about nine days ago. During work conditioning therapy on Thursday, his blood pressure was measured at 143/116 mmHg before starting exercises. He does not have a blood pressure cuff at home as his previous cuff was lost during a move a couple of months ago.  He experiences fatigue, characterized by frequent yawning, and reports a sensation in his left ear described as 'something crawling in there,' which is occasionally painful. No pressure or popping in the ear is noted. He also reports a pressure sensation in the back of his head. No dizziness, nausea, vomiting, numbness, or weakness in his hands and feet.  He is currently taking Celexa  20 mg for anxiety and depression, which he feels is effective for the most part. He has been prescribed clonazepam  0.5 mg to use as needed for severe panic attacks or stress, to be used very infrequently.     Review of Systems  Constitutional:  Negative for chills, fatigue and fever.  Respiratory:  Negative for cough, chest tightness, shortness of breath and wheezing.   Cardiovascular:  Negative for chest pain and palpitations.  Gastrointestinal:  Negative for abdominal pain, blood in stool and constipation.  Genitourinary:  Negative for dysuria.  Musculoskeletal:  Negative for back pain, joint swelling, myalgias and neck stiffness.  Skin:  Negative for rash.  Neurological:  Negative for dizziness, seizures, syncope, weakness and light-headedness.       See hpi  Hematological:   Negative for adenopathy.  Psychiatric/Behavioral:  Positive for dysphoric mood. Negative for behavioral problems, sleep disturbance and suicidal ideas. The patient is nervous/anxious.     Past Medical History:  Diagnosis Date   Back pain    lower   Cellulitis    Diabetes mellitus without complication (HCC)    Hypertension    Sarcoidosis    right eye     Social History   Socioeconomic History   Marital status: Married    Spouse name: Not on file   Number of children: Not on file   Years of education: Not on file   Highest education level: 12th grade  Occupational History   Not on file  Tobacco Use   Smoking status: Former    Types: Cigarettes   Smokeless tobacco: Never  Vaping Use   Vaping status: Never Used  Substance and Sexual Activity   Alcohol use: Not Currently    Comment: occassionally    Drug use: No   Sexual activity: Yes    Partners: Female    Comment: married 18 years.  Other Topics Concern   Not on file  Social History Narrative   Not on file   Social Drivers of Health   Financial Resource Strain: Low Risk  (07/09/2023)   Overall Financial Resource Strain (CARDIA)    Difficulty of Paying Living Expenses: Not very hard  Food Insecurity: Food Insecurity Present (07/09/2023)   Hunger Vital Sign    Worried About Running Out of Food in the Last Year: Never true    Ran Out of  Food in the Last Year: Sometimes true  Transportation Needs: No Transportation Needs (07/09/2023)   PRAPARE - Administrator, Civil Service (Medical): No    Lack of Transportation (Non-Medical): No  Physical Activity: Insufficiently Active (07/09/2023)   Exercise Vital Sign    Days of Exercise per Week: 2 days    Minutes of Exercise per Session: 30 min  Stress: No Stress Concern Present (07/09/2023)   [employer] of Occupational Health - Occupational Stress Questionnaire    Feeling of Stress : Not at all  Social Connections: Moderately Integrated (07/09/2023)    Social Connection and Isolation Panel [NHANES]    Frequency of Communication with Friends and Family: Twice a week    Frequency of Social Gatherings with Friends and Family: Once a week    Attends Religious Services: More than 4 times per year    Active Member of [employer] or Organizations: No    Attends Banker Meetings: Not on file    Marital Status: Married  Catering manager Violence: Not At Risk (10/29/2022)   Humiliation, Afraid, Rape, and Kick questionnaire    Fear of Current or Ex-Partner: No    Emotionally Abused: No    Physically Abused: No    Sexually Abused: No    Past Surgical History:  Procedure Laterality Date   left wrsit surgery Left    WRIST FRACTURE SURGERY Left 2017    Family History  Problem Relation Age of Onset   Hypertension Mother    Diabetes Mother    Hypertension Brother     No Known Allergies  Current Outpatient Medications on File Prior to Visit  Medication Sig Dispense Refill   atorvastatin  (LIPITOR) 10 MG tablet Take 1 tablet (10 mg total) by mouth daily. 30 tablet 11   Blood Glucose Monitoring Suppl DEVI 1 each by Does not apply route in the morning, at noon, and at bedtime. May substitute to any manufacturer covered by patient's insurance. 1 each 0   citalopram  (CELEXA ) 20 MG tablet Take 1 tablet (20 mg total) by mouth daily. 30 tablet 3   famciclovir  (FAMVIR ) 500 MG tablet Take 1 tablet (500 mg total) by mouth 3 (three) times daily. 21 tablet 1   gabapentin  (NEURONTIN ) 100 MG capsule Take 1 capsule (100 mg total) by mouth 3 (three) times daily. 270 capsule 0   meloxicam  (MOBIC ) 15 MG tablet Take 1 tablet (15 mg total) by mouth daily as directed for 10-14 days, then as needed. 60 tablet 0   methocarbamol  (ROBAXIN ) 500 MG tablet Take 1 tablet (500 mg total) by mouth at bedtime as needed for muscle spasms 15 tablet 0   potassium chloride  SA (KLOR-CON  M) 20 MEQ tablet Take 1 tablet (20 mEq total) by mouth 2 (two) times daily for 5 days. 10  tablet 0   tirzepatide  (MOUNJARO ) 5 MG/0.5ML Pen Inject 5 mg into the skin once a week. 6 mL 0   triamcinolone  (NASACORT ) 55 MCG/ACT AERO nasal inhaler Place 2 sprays into the nose daily. 1 each 12   valsartan  (DIOVAN ) 160 MG tablet Take 1 tablet (160 mg total) by mouth daily. 90 tablet 3   No current facility-administered medications on file prior to visit.    BP (!) 170/98   Pulse 72   Temp 98 F (36.7 C)   Resp 18   Ht 5\' 10"  (1.778 m)   Wt 214 lb 12.8 oz (97.4 kg)   SpO2 100%   BMI 30.82 kg/m  Objective:   Physical Exam  General Mental Status- Alert. General Appearance- Not in acute distress.    Neck  No JVD.  Chest and Lung Exam Auscultation: Breath Sounds:-CTA  Cardiovascular Auscultation:Rythm- RRR Murmurs & Other Heart Sounds:Auscultation of the heart reveals- No Murmurs.  Abdomen Inspection:-Inspeection Normal. Palpation/Percussion:Note:No mass. Palpation and Percussion of the abdomen reveal- Non Tender, Non Distended + BS, no rebound or guarding.    Neurologic Cranial Nerve exam:- CN III-XII intact(No nystagmus), symmetric smile. Drift Test:- No drift. Romberg Exam:- Negative.  Heal to Toe Gait exam:-Normal. Finger to Nose:- Normal/Intact Strength:- 5/5 equal and symmetric strength both upper and lower extremities.       Assessment & Plan:   Patient Instructions  Hypertension(near urgent range and approaching weekend) Hypertension with recent elevated readings likely due to missed valsartan  doses. Current BP 170/98. Reports occipital pressure, no neurological symptoms. Normal neurologic exam. - Increase valsartan  to 320 mg daily. - Obtain blood pressure cuff for home monitoring. - Check BP tomorrow afternoon and Sunday. - Update provider with readings on Monday. - Avoid caffeine and NSAIDs. - Hold physical therapy until BP is controlled. -if ever neuro motor or sensory deficit then be seen in ED. Any cardiac signs/symptom be seen in ED  as well.  Anxiety and depression Depression managed with Celexa  20 mg daily. Clonazepam  for severe panic attacks or stress. - Continue Celexa  20 mg daily. - Prescribe clonazepam  0.5 mg for severe panic attacks or stress, use sporadically. - Avoid operating heavy machinery when using clonazepam .  Ear discomfort Left ear discomfort possibly due to eustachian tube pressure related to allergy season. - Prescribe Flonase nasal spray to decrease eustachian tube pressure.  Follow up date to be determined after bp update on monday    Time spent with patient today was 41  minutes which consisted of chart review, discussing diagnosis, work up treatment and documentation.

## 2023-10-19 NOTE — Telephone Encounter (Signed)
Appt today at 2:20pm

## 2023-10-19 NOTE — Telephone Encounter (Signed)
 Initial Comment Caller states his bp was 143/116 this morning. Translation No Nurse Assessment Nurse: Michalene Agee, RN, Shelagh Derrick Date/Time (Eastern Time): 10/19/2023 1:09:14 PM Confirm and document reason for call. If symptomatic, describe symptoms. ---Caller states his BP was 143/116 this morning. Caller states that he was at his PT appointment and they would not let him proceed with his appointment. Caller states that he is currently on Valsartan  160mg ; has missed the last two doses. Does the patient have any new or worsening symptoms? ---Yes Will a triage be completed? ---Yes Related visit to physician within the last 2 weeks? ---No Does the PT have any chronic conditions? (i.e. diabetes, asthma, this includes High risk factors for pregnancy, etc.) ---Yes List chronic conditions. ---HTN, DM Is this a behavioral health or substance abuse call? ---No Guidelines Guideline Title Affirmed Question Affirmed Notes Nurse Date/Time (Eastern Time) Blood Pressure - High Systolic BP >= 180 OR Diastolic >= 110 McDonald, RN, Shelagh Derrick 10/19/2023 1:11:24 PM Disp. Time [facility] Time) Disposition Final User 10/19/2023 1:16:32 PM See PCP within 24 Hours Yes McDonald, RN, Shelagh Derrick PLEASE NOTE: All timestamps contained within this report are represented as [place] Standard Time. CONFIDENTIALTY NOTICE: This fax transmission is intended only for the addressee. It contains information that is legally privileged, confidential or otherwise protected from use or disclosure. If you are not the intended recipient, you are strictly prohibited from reviewing, disclosing, copying using or disseminating any of this information or taking any action in reliance on or regarding this information. If you have received this fax in error, please notify us  immediately by telephone so that we can arrange for its return to us . Phone: [phone], Toll-Free: [phone], Fax: [phone] [facility] 12-07-1975 Page: 2 of  2 CallId: [id number] Final Disposition 10/19/2023 1:16:32 PM See PCP within 24 Hours Yes McDonald, RN, Gregary Lean Disagree/Comply Comply Caller Understands Yes PreDisposition Call Doctor Care Advice Given Per Guideline SEE PCP WITHIN 24 HOURS: * IF OFFICE WILL BE OPEN: You need to be examined within the next 24 hours. Call your doctor (or NP/PA) when the office opens and make an appointment. CALL BACK IF: * Weakness or numbness of the face, arm or leg on one side of the body occurs * Difficulty walking, difficulty talking, or severe headache occurs * Chest pain or difficulty breathing occurs * You become worse CARE ADVICE given per Blood Pressure - High (Adult) guideline. HIGH BLOOD PRESSURE: * Untreated high blood pressure may cause damage to your heart, brain, kidneys, and eyes. * Treatment of high blood pressure can reduce the risk of stroke, heart attack, and heart failure. * The goal of blood pressure treatment depends on your age and if you have other health problems. A general goal is less than 130/80. But you should talk to your doctor (or NP/PA) about a goal that is right for you. Comments User: Gleda Lan, RN Date/Time [facility] Time): 10/19/2023 1:17:32 PM Per client directives advised patient to call the office - to let office know they spoke with nurse and need to schedule appointment. Caller verbalized understanding. Referrals REFERRED TO PCP OFFICE

## 2023-10-19 NOTE — Telephone Encounter (Signed)
 Copied from CRM [id number]. Topic: Clinical - Red Word Triage >> Oct 19, 2023  1:35 PM [place] E wrote: [facility] that prompted transfer to Nurse Triage: Elevated blood pressure. Patient states that his BP from 9 AM this morning from 143/116. Patient is not experiencing other symptoms.    Chief Complaint: BP 143/116 at PT today. Was high one other day per pt. Symptoms: none Frequency: today Pertinent Negatives: Patient denies  Disposition: [] ED /[] Urgent Care (no appt availability in office) / [x] Appointment(In office/virtual)/ []  [facility]/ [] Home Care/ [] Refused Recommended Disposition /[] [facility]/ []  Follow-up with PCP Additional Notes:    Reason for Disposition  Systolic BP  >= 180 OR Diastolic >= 110  Answer Assessment - Initial Assessment Questions 1. BLOOD PRESSURE: "What is the blood pressure?" "Did you take at least two measurements 5 minutes apart?"     143/116  PT 2. ONSET: "When did you take your blood pressure?"     0900 3. HOW: "How did you take your blood pressure?" (e.g., automatic home BP monitor, visiting nurse)     PT 4. HISTORY: "Do you have a history of high blood pressure?"     Yes 5. MEDICINES: "Are you taking any medicines for blood pressure?" "Have you missed any doses recently?"     Yes 6. OTHER SYMPTOMS: "Do you have any symptoms?" (e.g., blurred vision, chest pain, difficulty breathing, headache, weakness)     None 7. PREGNANCY: "Is there any chance you are pregnant?" "When was your last menstrual period?"     N/a  Protocols used: Blood Pressure - High-A-AH

## 2023-11-04 ENCOUNTER — Other Ambulatory Visit: Payer: Self-pay | Admitting: Medical

## 2023-11-04 ENCOUNTER — Encounter: Payer: Self-pay | Admitting: Medical

## 2023-11-05 ENCOUNTER — Other Ambulatory Visit (HOSPITAL_BASED_OUTPATIENT_CLINIC_OR_DEPARTMENT_OTHER): Payer: Self-pay

## 2023-11-05 MED ORDER — CLONAZEPAM 0.5 MG PO TABS
0.5000 mg | ORAL_TABLET | Freq: Two times a day (BID) | ORAL | 0 refills | Status: DC | PRN
  Filled 2023-11-05: qty 4, 2d supply, fill #0

## 2023-11-07 ENCOUNTER — Other Ambulatory Visit (HOSPITAL_BASED_OUTPATIENT_CLINIC_OR_DEPARTMENT_OTHER): Payer: Self-pay

## 2023-11-27 ENCOUNTER — Other Ambulatory Visit (HOSPITAL_BASED_OUTPATIENT_CLINIC_OR_DEPARTMENT_OTHER): Payer: Self-pay

## 2023-11-27 ENCOUNTER — Ambulatory Visit: Admitting: Medical

## 2023-11-27 VITALS — BP 150/100 | HR 85 | Resp 18 | Ht 70.0 in | Wt 208.6 lb

## 2023-11-27 DIAGNOSIS — G8929 Other chronic pain: Secondary | ICD-10-CM

## 2023-11-27 DIAGNOSIS — I10 Essential (primary) hypertension: Secondary | ICD-10-CM

## 2023-11-27 DIAGNOSIS — F419 Anxiety disorder, unspecified: Secondary | ICD-10-CM | POA: Diagnosis not present

## 2023-11-27 DIAGNOSIS — M25511 Pain in right shoulder: Secondary | ICD-10-CM | POA: Diagnosis not present

## 2023-11-27 DIAGNOSIS — E119 Type 2 diabetes mellitus without complications: Secondary | ICD-10-CM

## 2023-11-27 DIAGNOSIS — R002 Palpitations: Secondary | ICD-10-CM

## 2023-11-27 DIAGNOSIS — F32A Depression, unspecified: Secondary | ICD-10-CM

## 2023-11-27 MED ORDER — VENLAFAXINE HCL ER 37.5 MG PO CP24
37.5000 mg | ORAL_CAPSULE | Freq: Every day | ORAL | 0 refills | Status: DC
  Filled 2023-11-27: qty 30, 30d supply, fill #0

## 2023-11-27 MED ORDER — ALPRAZOLAM 0.5 MG PO TABS
0.5000 mg | ORAL_TABLET | Freq: Two times a day (BID) | ORAL | 0 refills | Status: DC | PRN
  Filled 2023-11-27: qty 4, 2d supply, fill #0

## 2023-11-27 MED ORDER — BUSPIRONE HCL 7.5 MG PO TABS
7.5000 mg | ORAL_TABLET | Freq: Two times a day (BID) | ORAL | 0 refills | Status: DC
  Filled 2023-11-27: qty 60, 30d supply, fill #0

## 2023-11-27 MED ORDER — VALSARTAN 320 MG PO TABS
320.0000 mg | ORAL_TABLET | Freq: Every day | ORAL | 0 refills | Status: DC
  Filled 2023-11-27: qty 30, 30d supply, fill #0

## 2023-11-27 NOTE — Progress Notes (Signed)
 Subjective:    Patient ID: Brett Allen, male    DOB: 06/23/1975, 48 y.o.   MRN: [id number]  HPI Brett Allen is a 48 year old male with hypertension and anxiety who presents with persisting rt shoulder pain and elevated blood pressure. He is accompanied by his partner, who provides additional information about his stress and anxiety.  He has persistent right shoulder pain following a work-related injury in August, which involved a dislocation, fracture, and torn labrum. Despite undergoing surgery and completing physical therapy, he continues to experience significant pain, rated as high, which disrupts his sleep. Although the orthopedist cleared him to return to work but he need to have paperwork by ortho specifying restrictions, Elevated blood pressure also appears to be barrier to return to work since very high readings.  He experiences increased anxiety and stress, particularly related to concerns about reinjuring his shoulder and financial pressures following his injury. He has rare occasional palpitations and difficulty sleeping, often waking up at night and unable to return to sleep. His anxiety score is 18, and he is currently taking Celexa  20 mg daily, which causes drowsiness and dry mouth. Clonazepam  is used for acute anxiety relief, which he finds effective.  His blood pressure has been elevated, with recent readings of 160/120, preventing him from returning to work. He is currently taking valsartan  160 mg daily for hypertension. His blood pressure was high enough during physical therapy sessions that he was sent home on several occasions. No ha, nausea, no vomiting, no gross motor or any sensory function deficits.   He has a history of diabetes, currently managed with Mounjaro , and reports good control with recent blood glucose levels around 94. He is due for a repeat A1c test soon to assess his diabetes management further.  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 Due to financial constraints following his injury, he and his family had to move to a smaller home. He expresses significant stress related to financial pressures and his ability to return to work.    Review of Systems  Constitutional:  Negative for chills and fever.  Respiratory:  Negative for apnea, cough and wheezing.   Cardiovascular:  Positive for palpitations. Negative for chest pain.       See hpi  Gastrointestinal:  Negative for abdominal pain, diarrhea, nausea and vomiting.  Musculoskeletal:  Negative for back pain.       Shoulder pain rt side.  Skin:  Negative for rash.  Neurological:  Negative for dizziness, seizures, syncope, weakness and headaches.       No current symptoms on exam.  Hematological:  Negative for adenopathy. Does not bruise/bleed easily.  Psychiatric/Behavioral:  Positive for dysphoric mood and sleep disturbance. Negative for agitation, confusion and suicidal ideas. The patient is nervous/anxious.    Past Medical History:  Diagnosis Date   Back pain    lower   Cellulitis    Diabetes mellitus without complication (HCC)    Hypertension    Sarcoidosis    right eye     Social History   Socioeconomic History   Marital status: Married    Spouse name: Not on file   Number of children: Not on file   Years of education: Not on file   Highest education level: 12th grade  Occupational History   Not on file  Tobacco Use   Smoking status: Former    Types: Cigarettes   Smokeless tobacco: Never  Vaping Use  Vaping status: Never Used  Substance and Sexual Activity   Alcohol use: Not Currently    Comment: occassionally    Drug use: No   Sexual activity: Yes    Partners: Female    Comment: married 18 years.  Other Topics Concern   Not on file  Social History Narrative   Not on file   Social Drivers of Health   Financial Resource Strain: Low Risk  (07/09/2023)   Overall Financial Resource Strain (CARDIA)    Difficulty of  Paying Living Expenses: Not very hard  Food Insecurity: Food Insecurity Present (07/09/2023)   Hunger Vital Sign    Worried About Running Out of Food in the Last Year: Never true    Ran Out of Food in the Last Year: Sometimes true  Transportation Needs: No Transportation Needs (07/09/2023)   PRAPARE - Administrator, Civil Service (Medical): No    Lack of Transportation (Non-Medical): No  Physical Activity: Insufficiently Active (07/09/2023)   Exercise Vital Sign    Days of Exercise per Week: 2 days    Minutes of Exercise per Session: 30 min  Stress: No Stress Concern Present (07/09/2023)   [employer] of Occupational Health - Occupational Stress Questionnaire    Feeling of Stress : Not at all  Social Connections: Moderately Integrated (07/09/2023)   Social Connection and Isolation Panel [NHANES]    Frequency of Communication with Friends and Family: Twice a week    Frequency of Social Gatherings with Friends and Family: Once a week    Attends Religious Services: More than 4 times per year    Active Member of [employer] or Organizations: No    Attends Banker Meetings: Not on file    Marital Status: Married  Catering manager Violence: Not At Risk (10/29/2022)   Humiliation, Afraid, Rape, and Kick questionnaire    Fear of Current or Ex-Partner: No    Emotionally Abused: No    Physically Abused: No    Sexually Abused: No    Past Surgical History:  Procedure Laterality Date   left wrsit surgery Left    WRIST FRACTURE SURGERY Left 2017    Family History  Problem Relation Age of Onset   Hypertension Mother    Diabetes Mother    Hypertension Brother     No Known Allergies  Current Outpatient Medications on File Prior to Visit  Medication Sig Dispense Refill   atorvastatin  (LIPITOR) 10 MG tablet Take 1 tablet (10 mg total) by mouth daily. 30 tablet 11   citalopram  (CELEXA ) 20 MG tablet Take 1 tablet (20 mg total) by mouth daily. 30 tablet 3   clonazePAM   (KLONOPIN ) 0.5 MG tablet Take 1 tablet (0.5 mg total) by mouth 2 (two) times daily as needed for anxiety. 4 tablet 0   famciclovir  (FAMVIR ) 500 MG tablet Take 1 tablet (500 mg total) by mouth 3 (three) times daily. 21 tablet 1   gabapentin  (NEURONTIN ) 100 MG capsule Take 1 capsule (100 mg total) by mouth 3 (three) times daily. 270 capsule 0   meloxicam  (MOBIC ) 15 MG tablet Take 1 tablet (15 mg total) by mouth daily as directed for 10-14 days, then as needed. 60 tablet 0   methocarbamol  (ROBAXIN ) 500 MG tablet Take 1 tablet (500 mg total) by mouth at bedtime as needed for muscle spasms 15 tablet 0   tirzepatide  (MOUNJARO ) 5 MG/0.5ML Pen Inject 5 mg into the skin once a week. 6 mL 0   No current facility-administered  medications on file prior to visit.    BP (!) 150/100 Comment: checked twice  Pulse 85   Resp 18   Ht 5\' 10"  (1.778 m)   Wt 208 lb 9.6 oz (94.6 kg)   SpO2 97%   BMI 29.93 kg/m         Objective:   Physical Exam  General Mental Status- Alert. General Appearance- Not in acute distress.   Skin General: Color- Normal Color. Moisture- Normal Moisture.  Neck  No JVD.  Chest and Lung Exam Auscultation: Breath Sounds:-CTA  Cardiovascular Auscultation:Rythm- Regular. Murmurs & Other Heart Sounds:Auscultation of the heart reveals- No Murmurs.  Abdomen Inspection:-Inspeection Normal. Palpation/Percussion:Note:No mass. Palpation and Percussion of the abdomen reveal- Non Tender, Non Distended + BS, no rebound or guarding.   Neurologic Cranial Nerve exam:- CN III-XII intact(No nystagmus), symmetric smile. Drift Test:- No drift. Finger to Nose:- Normal/Intact Strength:- 5/5 equal and symmetric strength both upper and lower extremities.       Assessment & Plan:   Patient Instructions  Anxiety and Depression Increased anxiety and depression with current citalopram  causing sedation and dry mouth. Venlafaxine suggested for better energy and reduced sedation.  Behavioral therapy recommended for stress management. - Discontinue citalopram  and start venlafaxine 37.5 mg daily. - Prescribe buspirone 7.5 mg twice daily to start 10-14 days after venlafaxine initiation. - Provide a list of behavioral health resources for counseling and psychiatry. - Prescribe clonazepam  for acute anxiety panic as needed.  Hypertension Blood pressure elevated at 160/120 mmHg. Anxiety and stress may contribute but not fully account for elevation. Increase valsartan  for better control. - Increase valsartan  to 320 mg daily. - Monitor blood pressure daily.  - Follow up in 18th june  to reassess blood pressure control.  Right Shoulder Pain Persistent pain post-dislocation, fracture, and surgery. Nsaid use not indicated due to bp and narcotic cause sedation.. Lidocaine  patches and acetaminophen  recommended. Tramadol  considered once blood pressure is controlled and already returned to work. - Recommend over-the-counter lidocaine  patches for pain relief. - Consider acetaminophen  for pain management. - Reassess pain management options once blood pressure is controlled and back to work. Hopefully no sedation with meds for anxiety and depresssion.  Diabetes Mellitus Blood glucose levels stable with Mounjaro . A1c test needed to assess long-term control. - Schedule A1c test and metabolic panel for June 18 - Consider medication adjustments based on A1c results.  Palpitation -Rare occurrence. ekg shows normal sinus Rythm. -advise stop any caffeine beverages.  -if recurrent palpitations recommend zio patch.  Follow up june 18 or sooner if needed     Time spent with patient today was  43 minutes which consisted of chart revdiew, discussing diagnosis, work up treatment and documentation.

## 2023-11-27 NOTE — Patient Instructions (Addendum)
 Anxiety and Depression Increased anxiety and depression with current citalopram  causing sedation and dry mouth. Venlafaxine suggested for better energy and reduced sedation. Behavioral therapy recommended for stress management. - Discontinue citalopram  and start venlafaxine 37.5 mg daily. - Prescribe buspirone 7.5 mg twice daily to start 10-14 days after venlafaxine initiation. - Provide a list of behavioral health resources for counseling and psychiatry. - Prescribe clonazepam  for acute anxiety panic as needed.  Hypertension Blood pressure elevated at 160/120 mmHg. Anxiety and stress may contribute but not fully account for elevation. Increase valsartan  for better control. - Increase valsartan  to 320 mg daily. - Monitor blood pressure daily.  - Follow up in 18th june  to reassess blood pressure control.  Right Shoulder Pain Persistent pain post-dislocation, fracture, and surgery. Nsaid use not indicated due to bp and narcotic cause sedation.. Lidocaine  patches and acetaminophen  recommended. Tramadol  considered once blood pressure is controlled and already returned to work. - Recommend over-the-counter lidocaine  patches for pain relief. - Consider acetaminophen  for pain management. - Reassess pain management options once blood pressure is controlled and back to work. Hopefully no sedation with meds for anxiety and depresssion.  Diabetes Mellitus Blood glucose levels stable with Mounjaro . A1c test needed to assess long-term control. - Schedule A1c test and metabolic panel for June 18 - Consider medication adjustments based on A1c results.  Palpitation -Rare occurrence. ekg shows normal sinus Rythm. -advise stop any caffeine beverages.  -if recurrent palpitations recommend zio patch.  Follow up june 18 or sooner if needed

## 2023-11-29 ENCOUNTER — Other Ambulatory Visit (HOSPITAL_BASED_OUTPATIENT_CLINIC_OR_DEPARTMENT_OTHER): Payer: Self-pay

## 2023-12-05 ENCOUNTER — Ambulatory Visit: Payer: Self-pay | Admitting: Medical

## 2023-12-05 ENCOUNTER — Encounter: Payer: Self-pay | Admitting: Medical

## 2023-12-05 ENCOUNTER — Other Ambulatory Visit (HOSPITAL_BASED_OUTPATIENT_CLINIC_OR_DEPARTMENT_OTHER): Payer: Self-pay

## 2023-12-05 ENCOUNTER — Ambulatory Visit: Admitting: Medical

## 2023-12-05 VITALS — BP 148/92 | HR 66 | Temp 98.0°F | Resp 18 | Ht 70.0 in | Wt 210.6 lb

## 2023-12-05 DIAGNOSIS — F32A Depression, unspecified: Secondary | ICD-10-CM

## 2023-12-05 DIAGNOSIS — M25511 Pain in right shoulder: Secondary | ICD-10-CM

## 2023-12-05 DIAGNOSIS — G44209 Tension-type headache, unspecified, not intractable: Secondary | ICD-10-CM

## 2023-12-05 DIAGNOSIS — F419 Anxiety disorder, unspecified: Secondary | ICD-10-CM

## 2023-12-05 DIAGNOSIS — G8929 Other chronic pain: Secondary | ICD-10-CM

## 2023-12-05 DIAGNOSIS — E1165 Type 2 diabetes mellitus with hyperglycemia: Secondary | ICD-10-CM | POA: Diagnosis not present

## 2023-12-05 DIAGNOSIS — I10 Essential (primary) hypertension: Secondary | ICD-10-CM

## 2023-12-05 DIAGNOSIS — Z7985 Long-term (current) use of injectable non-insulin antidiabetic drugs: Secondary | ICD-10-CM

## 2023-12-05 LAB — COMPREHENSIVE METABOLIC PANEL WITH GFR
ALT: 15 U/L (ref 0–53)
AST: 17 U/L (ref 0–37)
Albumin: 4.4 g/dL (ref 3.5–5.2)
Alkaline Phosphatase: 75 U/L (ref 39–117)
BUN: 15 mg/dL (ref 6–23)
CO2: 32 meq/L (ref 19–32)
Calcium: 9.6 mg/dL (ref 8.4–10.5)
Chloride: 102 meq/L (ref 96–112)
Creatinine, Ser: 1.38 mg/dL (ref 0.40–1.50)
GFR: 61.74 mL/min (ref >60.00)
Glucose, Bld: 93 mg/dL (ref 70–99)
Potassium: 3.6 meq/L (ref 3.5–5.1)
Sodium: 139 meq/L (ref 135–145)
Total Bilirubin: 0.7 mg/dL (ref 0.2–1.2)
Total Protein: 7.5 g/dL (ref 6.0–8.3)

## 2023-12-05 LAB — HEMOGLOBIN A1C: Hgb A1c MFr Bld: 7.4 % — ABNORMAL HIGH (ref 4.6–6.5)

## 2023-12-05 MED ORDER — CYCLOBENZAPRINE HCL 5 MG PO TABS
5.0000 mg | ORAL_TABLET | Freq: Every day | ORAL | 0 refills | Status: DC | PRN
  Filled 2023-12-05: qty 3, 3d supply, fill #0

## 2023-12-05 MED ORDER — AMLODIPINE BESYLATE 10 MG PO TABS
10.0000 mg | ORAL_TABLET | Freq: Every day | ORAL | 0 refills | Status: DC
  Filled 2023-12-05: qty 30, 30d supply, fill #0

## 2023-12-05 MED ORDER — CYCLOBENZAPRINE HCL 5 MG PO TABS
5.0000 mg | ORAL_TABLET | Freq: Every day | ORAL | 0 refills | Status: DC
  Filled 2023-12-05: qty 3, 3d supply, fill #0

## 2023-12-05 NOTE — Progress Notes (Signed)
 Subjective:    Patient ID: Brett Allen, male    DOB: [birth year], 48 y.o.   MRN: [id number]  HPI Brett Allen is a 48 year old male with hypertension and anxiety who presents with elevated blood pressure and anxiety management.  He has been experiencing elevated blood pressure readings despite being on the maximum dose of valsartan  at 320 mg daily. Home blood pressure readings have been around 134/107 mmHg, which is above the requirement for his work conditioning program that mandates a diastolic pressure below 90 mmHg. 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 He has been trying to manage his blood pressure by walking two to three times a week and reducing his intake of sodas and salt.  He has a history of low potassium levels when previously on lisinopril  and hydrochlorothiazide , which led to discontinuation of those medications. Family history includes angioedema with lisinopril  use.  He experiences possible tension headaches, particularly in the trapezius and occipital areas, described as 'big tension headaches' that last for hours about one week ago. He did not check his blood pressure during these episodes but took his medication as prescribed. He associates these headaches with stress. No vision changes, vomiting, or weakness in arms or legs during these episodes.  He has a history of anxiety and has been experiencing increased anxiety levels, particularly at night, leading to disrupted sleep patterns. He is currently on venlafaxine . He reports that taking Xanax  during one severe anxiety episode helped him feel 'calm the whole day'.  Yet to start buspar . Advised can start this Saturday.  He has a history of elevated A1c levels, with the most recent being 9.0 three months ago. He is currently on Mounjaro  for diabetes management and expects his A1c to have  decreased since starting this medication. He is not currently on any other diabetes medications.  Pt still out of work since his work can't accommodate his modified work duty for rt shoulder.   Review of Systems  Constitutional:  Negative for chills, fatigue and fever.  HENT:  Negative for congestion and ear pain.   Respiratory:  Negative for chest tightness, shortness of breath and wheezing.   Cardiovascular:  Negative for chest pain and palpitations.  Gastrointestinal:  Negative for abdominal pain and constipation.  Genitourinary:  Negative for dysuria and frequency.  Musculoskeletal:        Rt shoulder pain  Skin:  Negative for rash.  Neurological:  Negative for dizziness, seizures and headaches.  Hematological:  Negative for adenopathy. Does not bruise/bleed easily.  Psychiatric/Behavioral:  Positive for dysphoric mood and sleep disturbance. Negative for behavioral problems, decreased concentration and suicidal ideas. The patient is nervous/anxious.     Past Medical History:  Diagnosis Date   Back pain    lower   Cellulitis    Diabetes mellitus without complication (HCC)    Hypertension    Sarcoidosis    right eye     Social History   Socioeconomic History   Marital status: Married    Spouse name: Not on file   Number of children: Not on file   Years of education: Not on file   Highest education level: 12th grade  Occupational History   Not on file  Tobacco Use   Smoking status: Former    Types: Cigarettes   Smokeless tobacco: Never  Vaping Use   Vaping status: Never Used  Substance and Sexual Activity  Alcohol use: Not Currently    Comment: occassionally    Drug use: No   Sexual activity: Yes    Partners: Female    Comment: married 18 years.  Other Topics Concern   Not on file  Social History Narrative   Not on file   Social Drivers of Health   Financial Resource Strain: Medium Risk (12/04/2023)   Overall Financial Resource Strain (CARDIA)     Difficulty of Paying Living Expenses: Somewhat hard  Food Insecurity: No Food Insecurity (12/04/2023)   Hunger Vital Sign    Worried About Running Out of Food in the Last Year: Never true    Ran Out of Food in the Last Year: Never true  Transportation Needs: No Transportation Needs (12/04/2023)   PRAPARE - Administrator, Civil Service (Medical): No    Lack of Transportation (Non-Medical): No  Physical Activity: Insufficiently Active (12/04/2023)   Exercise Vital Sign    Days of Exercise per Week: 3 days    Minutes of Exercise per Session: 20 min  Stress: Stress Concern Present (12/04/2023)   [employer] of Occupational Health - Occupational Stress Questionnaire    Feeling of Stress: Very much  Social Connections: Socially Integrated (12/04/2023)   Social Connection and Isolation Panel    Frequency of Communication with Friends and Family: Twice a week    Frequency of Social Gatherings with Friends and Family: Once a week    Attends Religious Services: More than 4 times per year    Active Member of [employer] or Organizations: Yes    Attends Engineer, structural: More than 4 times per year    Marital Status: Married  Catering manager Violence: Not At Risk (10/29/2022)   Humiliation, Afraid, Rape, and Kick questionnaire    Fear of Current or Ex-Partner: No    Emotionally Abused: No    Physically Abused: No    Sexually Abused: No    Past Surgical History:  Procedure Laterality Date   left wrsit surgery Left    WRIST FRACTURE SURGERY Left 2017    Family History  Problem Relation Age of Onset   Hypertension Mother    Diabetes Mother    Hypertension Brother     No Known Allergies  Current Outpatient Medications on File Prior to Visit  Medication Sig Dispense Refill   ALPRAZolam  (XANAX ) 0.5 MG tablet Take 1 tablet (0.5 mg total) by mouth 2 (two) times daily as needed for anxiety. 4 tablet 0   atorvastatin  (LIPITOR) 10 MG tablet Take 1 tablet (10 mg total) by  mouth daily. 30 tablet 11   busPIRone  (BUSPAR ) 7.5 MG tablet Take 1 tablet (7.5 mg total) by mouth 2 (two) times daily. 60 tablet 0   citalopram  (CELEXA ) 20 MG tablet Take 1 tablet (20 mg total) by mouth daily. 30 tablet 3   clonazePAM  (KLONOPIN ) 0.5 MG tablet Take 1 tablet (0.5 mg total) by mouth 2 (two) times daily as needed for anxiety. 4 tablet 0   famciclovir  (FAMVIR ) 500 MG tablet Take 1 tablet (500 mg total) by mouth 3 (three) times daily. 21 tablet 1   gabapentin  (NEURONTIN ) 100 MG capsule Take 1 capsule (100 mg total) by mouth 3 (three) times daily. 270 capsule 0   meloxicam  (MOBIC ) 15 MG tablet Take 1 tablet (15 mg total) by mouth daily as directed for 10-14 days, then as needed. 60 tablet 0   tirzepatide  (MOUNJARO ) 5 MG/0.5ML Pen Inject 5 mg into the skin once a  week. 6 mL 0   valsartan  (DIOVAN ) 320 MG tablet Take 1 tablet (320 mg total) by mouth daily. 30 tablet 0   venlafaxine  XR (EFFEXOR  XR) 37.5 MG 24 hr capsule Take 1 capsule (37.5 mg total) by mouth daily with breakfast. 30 capsule 0   No current facility-administered medications on file prior to visit.    BP (!) 148/92   Pulse 66   Temp 98 F (36.7 C)   Resp 18   Ht 5' 10 (1.778 m)   Wt 210 lb 9.6 oz (95.5 kg)   SpO2 97%   BMI 30.22 kg/m        Objective:   Physical Exam  General Mental Status- Alert. General Appearance- Not in acute distress.   Neck  No JVD.  Chest and Lung Exam Auscultation: Breath Sounds:-Normal.  Cardiovascular Auscultation:Rythm- Regular. Murmurs & Other Heart Sounds:Auscultation of the heart reveals- No Murmurs.  Abdomen Inspection:-Inspeection Normal. Palpation/Percussion:Note:No mass. Palpation and Percussion of the abdomen reveal- Non Tender, Non Distended + BS, no rebound or guarding.   Neurologic Cranial Nerve exam:- CN III-XII intact(No nystagmus), symmetric smile. Strength:- 5/5 equal and symmetric strength both upper and lower extremities.       Assessment &  Plan:   Patient Instructions  Hypertension Blood pressure remains elevated despite maximum valsartan . Diastolic pressure above target for work conditioning thru PT. Amlodipine added to regimen. - Prescribe amlodipine 10 mg daily. - Continue valsartan  320 mg daily. - Advise regular blood pressure monitoring. - Provide work conditioning note indicating addition of amlodipine.  Tension Headaches Headaches in trapezius and occipital area(not presently but had last week). Advised to check blood pressure during headaches to rule out hypertension. - Prescribe Flexeril  5 mg for tension headaches.(rx advisement given) - Advise taking Flexeril  with acetaminophen . - Instruct to take Flexeril  when at home and not driving. - Advise checking blood pressure during headaches.  Right Shoulder Pain Under orthopedic care with lifting restrictions. Pain managed with acetaminophen  and lidocaine  patch. - Continue acetaminophen  and lidocaine  patch for pain management. - Follow up with orthopedic specialist on July 14th.  Anxiety and depression both improved with effexor  Experiences anxiety, effective Xanax  use for acute episodes. Plans to start Buspar . - Start Buspar  7.5 mg twice daily. - Continue venlafaxine . - Use Xanax  sparingly for acute anxiety episodes. -  Type 2 Diabetes Mellitus Last A1c 9.0% in March. On Mounjaro , expects improvement. A1c check planned today. - Check A1c today and cmp today - Continue Mounjaro . - Consider adjusting Mounjaro  dose based on A1c results.  General Health Maintenance Lifestyle modifications made to improve health and manage hypertension and anxiety. - Continue lifestyle modifications, including reducing soda and caffeine intake.   Follow up on December 17, 2023.    Time spent with patient today was  41 minutes which consisted of chart review, discussing diagnosis, work up treatment and documentation.

## 2023-12-05 NOTE — Patient Instructions (Signed)
 Hypertension Blood pressure remains elevated despite maximum valsartan . Diastolic pressure above target for work conditioning thru PT. Amlodipine added to regimen. - Prescribe amlodipine 10 mg daily. - Continue valsartan  320 mg daily. - Advise regular blood pressure monitoring. - Provide work conditioning note indicating addition of amlodipine.  Tension Headaches Headaches in trapezius and occipital area(not presently but had last week). Advised to check blood pressure during headaches to rule out hypertension. - Prescribe Flexeril  5 mg for tension headaches.(rx advisement given) - Advise taking Flexeril  with acetaminophen . - Instruct to take Flexeril  when at home and not driving. - Advise checking blood pressure during headaches.  Right Shoulder Pain Under orthopedic care with lifting restrictions. Pain managed with acetaminophen  and lidocaine  patch. - Continue acetaminophen  and lidocaine  patch for pain management. - Follow up with orthopedic specialist on July 14th.  Anxiety and depression both improved with effexor  Experiences anxiety, effective Xanax  use for acute episodes. Plans to start Buspar . - Start Buspar  7.5 mg twice daily. - Continue venlafaxine . - Use Xanax  sparingly for acute anxiety episodes. -  Type 2 Diabetes Mellitus Last A1c 9.0% in March. On Mounjaro , expects improvement. A1c check planned today. - Check A1c today and cmp today - Continue Mounjaro . - Consider adjusting Mounjaro  dose based on A1c results.  General Health Maintenance Lifestyle modifications made to improve health and manage hypertension and anxiety. - Continue lifestyle modifications, including reducing soda and caffeine intake.   Follow up on December 17, 2023.

## 2023-12-17 ENCOUNTER — Ambulatory Visit: Admitting: Medical

## 2023-12-18 ENCOUNTER — Ambulatory Visit: Admitting: Medical

## 2023-12-18 ENCOUNTER — Other Ambulatory Visit (HOSPITAL_BASED_OUTPATIENT_CLINIC_OR_DEPARTMENT_OTHER): Payer: Self-pay

## 2023-12-18 MED ORDER — TIRZEPATIDE 7.5 MG/0.5ML ~~LOC~~ SOAJ
7.5000 mg | SUBCUTANEOUS | 0 refills | Status: DC
  Filled 2023-12-18: qty 2, 28d supply, fill #0

## 2023-12-18 NOTE — Addendum Note (Signed)
 Addended by: DORINA DALLAS HERO on: 12/18/2023 04:11 PM   Modules accepted: Orders

## 2023-12-24 ENCOUNTER — Emergency Department (HOSPITAL_BASED_OUTPATIENT_CLINIC_OR_DEPARTMENT_OTHER)
Admission: EM | Admit: 2023-12-24 | Discharge: 2023-12-24 | Disposition: A | Discharge status: Home / Self Care | Attending: Emergency Medicine | Admitting: Emergency Medicine

## 2023-12-24 ENCOUNTER — Emergency Department (HOSPITAL_BASED_OUTPATIENT_CLINIC_OR_DEPARTMENT_OTHER)

## 2023-12-24 ENCOUNTER — Encounter (HOSPITAL_BASED_OUTPATIENT_CLINIC_OR_DEPARTMENT_OTHER): Payer: Self-pay

## 2023-12-24 ENCOUNTER — Other Ambulatory Visit (HOSPITAL_BASED_OUTPATIENT_CLINIC_OR_DEPARTMENT_OTHER): Payer: Self-pay

## 2023-12-24 ENCOUNTER — Other Ambulatory Visit: Payer: Self-pay

## 2023-12-24 DIAGNOSIS — M25511 Pain in right shoulder: Secondary | ICD-10-CM | POA: Diagnosis not present

## 2023-12-24 DIAGNOSIS — X500XXA Overexertion from strenuous movement or load, initial encounter: Secondary | ICD-10-CM | POA: Insufficient documentation

## 2023-12-24 DIAGNOSIS — S4291XA Fracture of right shoulder girdle, part unspecified, initial encounter for closed fracture: Secondary | ICD-10-CM | POA: Diagnosis not present

## 2023-12-24 MED ORDER — FAMOTIDINE 40 MG PO TABS
40.0000 mg | ORAL_TABLET | Freq: Every day | ORAL | 0 refills | Status: DC
  Filled 2023-12-24: qty 7, 7d supply, fill #0

## 2023-12-24 MED ORDER — OXYCODONE-ACETAMINOPHEN 5-325 MG PO TABS
1.0000 | ORAL_TABLET | ORAL | Status: DC | PRN
  Administered 2023-12-24: 1 via ORAL
  Filled 2023-12-24: qty 1

## 2023-12-24 MED ORDER — KETOROLAC TROMETHAMINE 60 MG/2ML IM SOLN
30.0000 mg | Freq: Once | INTRAMUSCULAR | Status: AC
  Administered 2023-12-24: 30 mg via INTRAMUSCULAR
  Filled 2023-12-24: qty 2

## 2023-12-24 MED ORDER — KETOROLAC TROMETHAMINE 10 MG PO TABS
10.0000 mg | ORAL_TABLET | Freq: Four times a day (QID) | ORAL | 0 refills | Status: DC | PRN
  Filled 2023-12-24: qty 20, 5d supply, fill #0

## 2023-12-24 NOTE — ED Provider Notes (Signed)
 [facility] Provider Note   CSN: [id number] Arrival date & time: 12/24/23  1231     Patient presents with: Shoulder Injury   Brett Allen is a 48 y.o. male.   Otherwise healthy 48 year old male here today after he injured his right shoulder while he was doing physical therapy lifting a box over his head.  Patient was doing physical therapy to recover from a torn labrum secondary to a shoulder dislocation.  He does not believe the shoulder dislocated.  He is following up with his orthopedic surgeon on Wednesday.   Shoulder Injury       Prior to Admission medications   Medication Sig Start Date End Date Taking? Authorizing Provider  famotidine  (PEPCID ) 40 MG tablet Take 1 tablet (40 mg total) by mouth daily. 12/24/23  Yes Mannie Pac T, DO  ketorolac  (TORADOL ) 10 MG tablet Take 1 tablet (10 mg total) by mouth every 6 (six) hours as needed. 12/24/23  Yes Mannie Pac T, DO  ALPRAZolam  (XANAX ) 0.5 MG tablet Take 1 tablet (0.5 mg total) by mouth 2 (two) times daily as needed for anxiety. 11/27/23   Saguier, [place], PA-C  amLODipine  (NORVASC ) 10 MG tablet Take 1 tablet (10 mg total) by mouth daily. 12/05/23   Saguier, [place], PA-C  atorvastatin  (LIPITOR) 10 MG tablet Take 1 tablet (10 mg total) by mouth daily. 02/03/23   Saguier, [place], PA-C  busPIRone  (BUSPAR ) 7.5 MG tablet Take 1 tablet (7.5 mg total) by mouth 2 (two) times daily. 11/27/23   Saguier, Dallas, PA-C  citalopram  (CELEXA ) 20 MG tablet Take 1 tablet (20 mg total) by mouth daily. 10/08/23   Saguier, [place], PA-C  clonazePAM  (KLONOPIN ) 0.5 MG tablet Take 1 tablet (0.5 mg total) by mouth 2 (two) times daily as needed for anxiety. 11/05/23   Webb, Padonda B, FNP  cyclobenzaprine  (FLEXERIL ) 5 MG tablet Take 1 tablet (5 mg total) by mouth daily as needed for tension headache. 12/05/23   Saguier, Dallas, PA-C  famciclovir  (FAMVIR ) 500 MG tablet Take 1 tablet (500 mg total) by mouth 3  (three) times daily. 11/28/22   Saguier, Dallas, PA-C  gabapentin  (NEURONTIN ) 100 MG capsule Take 1 capsule (100 mg total) by mouth 3 (three) times daily. 12/28/22   Saguier, Dallas, PA-C  meloxicam  (MOBIC ) 15 MG tablet Take 1 tablet (15 mg total) by mouth daily as directed for 10-14 days, then as needed. 09/10/23     tirzepatide  (MOUNJARO ) 7.5 MG/0.5ML Pen Inject 7.5 mg into the skin once a week. 12/18/23   Saguier, Dallas, PA-C  valsartan  (DIOVAN ) 320 MG tablet Take 1 tablet (320 mg total) by mouth daily. 11/27/23   Saguier, Dallas, PA-C  venlafaxine  XR (EFFEXOR  XR) 37.5 MG 24 hr capsule Take 1 capsule (37.5 mg total) by mouth daily with breakfast. 11/27/23   Saguier, Dallas, PA-C    Allergies: Patient has no known allergies.    Review of Systems  Updated Vital Signs BP (!) 140/104 (BP Location: Right Arm)   Pulse 84   Temp (!) 96 F (35.6 C) (Temporal)   Resp 18   Ht 5' 10 (1.778 m)   Wt 93 kg   SpO2 99%   BMI 29.41 kg/m   Physical Exam Vitals and nursing note reviewed.  Musculoskeletal:     Comments: Shoulder normal in appearance, stable in socket.  Patient with tenderness over the acromion.  No tenderness in the trapezius, pectoral.  Neurological:     Mental Status:  He is alert.     (all labs ordered are listed, but only abnormal results are displayed) Labs Reviewed - No data to display  EKG: None  Radiology: DG Shoulder Right Result Date: 12/24/2023 CLINICAL DATA:  Right Shoulder Injury EXAM: RIGHT SHOULDER - 2+ VIEW COMPARISON:  X-ray right shoulder 02/07/2023 FINDINGS: There is no evidence of fracture or dislocation. Old healed inferior glenoid fracture. There is no evidence of severe arthropathy or other focal bone abnormality. Soft tissues are unremarkable. IMPRESSION: No acute displaced fracture or dislocation. Electronically Signed   By: Morgane  Naveau M.D.   On: 12/24/2023 14:16     Procedures   Medications Ordered in the ED  oxyCODONE -acetaminophen   (PERCOCET/ROXICET) 5-325 MG per tablet 1 tablet (1 tablet Oral Given 12/24/23 1323)  ketorolac  (TORADOL ) injection 30 mg (30 mg Intramuscular Given 12/24/23 1627)                                    Medical Decision Making 48 year old male here today with popping in his right shoulder after lifting something above his head.  Differential diagnoses include labrum reinjury, dislocation relocation, rotator cuff injury.  Plan-negative plain films.  Patient has some tenderness over the anterior shoulder, possibly rotator cuff injury versus relocation after dislocation.  Patient ultimately required additional imaging.  He is following up with his orthopedic doctor on Wednesday.  Will give the patient some Toradol  here in the ED.  Will send a prescription for Toradol , famotidine .  Reviewed the patient's most recent PCP note.  Amount and/or Complexity of Data Reviewed Radiology: ordered.  Risk Prescription drug management.        Final diagnoses:  Acute pain of right shoulder    ED Discharge Orders          Ordered    ketorolac  (TORADOL ) 10 MG tablet  Every 6 hours PRN        12/24/23 1700    famotidine  (PEPCID ) 40 MG tablet  Daily        12/24/23 1700               Mannie Pac T, DO 12/24/23 1701

## 2023-12-24 NOTE — ED Triage Notes (Signed)
 Patient here POV from Home.  Endorses performing work exercises where he was lifting a 20 lb box above his head and heard a Pop to right shoulder. This occurred 2 hours ago.  NAD Noted during triage. A&Ox4. GCS 15. Ambulatory.

## 2023-12-24 NOTE — Discharge Instructions (Signed)
 I have sent your prescription for Toradol .  You can take this every 6 hours.  Please take it with famotidine  as this will protect your stomach.  Follow-up with your orthopedic doctor on Wednesday.

## 2024-01-01 ENCOUNTER — Ambulatory Visit: Admitting: Psychology

## 2024-01-07 ENCOUNTER — Ambulatory Visit: Admitting: Medical

## 2024-01-10 ENCOUNTER — Other Ambulatory Visit (HOSPITAL_BASED_OUTPATIENT_CLINIC_OR_DEPARTMENT_OTHER): Payer: Self-pay

## 2024-01-10 MED ORDER — TRAMADOL HCL 50 MG PO TABS
50.0000 mg | ORAL_TABLET | Freq: Four times a day (QID) | ORAL | 0 refills | Status: DC | PRN
  Filled 2024-01-10: qty 20, 5d supply, fill #0

## 2024-01-14 ENCOUNTER — Ambulatory Visit: Admitting: Medical

## 2024-01-17 ENCOUNTER — Other Ambulatory Visit (HOSPITAL_BASED_OUTPATIENT_CLINIC_OR_DEPARTMENT_OTHER): Payer: Self-pay

## 2024-01-17 MED ORDER — TRAMADOL HCL 50 MG PO TABS
50.0000 mg | ORAL_TABLET | Freq: Two times a day (BID) | ORAL | 0 refills | Status: DC | PRN
Start: 2024-01-17 — End: 2024-01-30
  Filled 2024-01-17: qty 20, 10d supply, fill #0

## 2024-01-18 ENCOUNTER — Other Ambulatory Visit (HOSPITAL_BASED_OUTPATIENT_CLINIC_OR_DEPARTMENT_OTHER): Payer: Self-pay

## 2024-01-18 MED ORDER — TIRZEPATIDE 10 MG/0.5ML ~~LOC~~ SOAJ
10.0000 mg | SUBCUTANEOUS | 0 refills | Status: DC
  Filled 2024-01-18: qty 6, 84d supply, fill #0

## 2024-01-18 NOTE — Addendum Note (Signed)
 Addended by: DORINA DALLAS HERO on: 01/18/2024 06:55 AM   Modules accepted: Orders

## 2024-01-29 ENCOUNTER — Other Ambulatory Visit (HOSPITAL_BASED_OUTPATIENT_CLINIC_OR_DEPARTMENT_OTHER): Payer: Self-pay

## 2024-01-30 ENCOUNTER — Other Ambulatory Visit (HOSPITAL_BASED_OUTPATIENT_CLINIC_OR_DEPARTMENT_OTHER): Payer: Self-pay

## 2024-01-30 MED ORDER — TRAMADOL HCL 50 MG PO TABS
50.0000 mg | ORAL_TABLET | Freq: Two times a day (BID) | ORAL | 0 refills | Status: DC | PRN
  Filled 2024-01-30 (×2): qty 20, 10d supply, fill #0

## 2024-02-11 ENCOUNTER — Ambulatory Visit: Admitting: Medical

## 2024-02-12 ENCOUNTER — Ambulatory Visit: Admitting: Medical

## 2024-02-26 ENCOUNTER — Encounter: Payer: Self-pay | Admitting: Medical

## 2024-03-24 ENCOUNTER — Other Ambulatory Visit (HOSPITAL_BASED_OUTPATIENT_CLINIC_OR_DEPARTMENT_OTHER): Payer: Self-pay

## 2024-03-24 ENCOUNTER — Other Ambulatory Visit: Payer: Self-pay | Admitting: Medical

## 2024-03-24 MED ORDER — AMLODIPINE BESYLATE 10 MG PO TABS
10.0000 mg | ORAL_TABLET | Freq: Every day | ORAL | 0 refills | Status: DC
  Filled 2024-03-24: qty 90, 90d supply, fill #0
  Filled 2024-04-03: qty 30, 30d supply, fill #0
  Filled 2024-05-07: qty 30, 30d supply, fill #1
  Filled 2024-06-03: qty 30, 30d supply, fill #2

## 2024-03-24 MED ORDER — VALSARTAN 320 MG PO TABS
320.0000 mg | ORAL_TABLET | Freq: Every day | ORAL | 0 refills | Status: DC
  Filled 2024-03-24: qty 90, 90d supply, fill #0
  Filled 2024-04-03: qty 30, 30d supply, fill #0
  Filled 2024-05-07: qty 30, 30d supply, fill #1
  Filled 2024-06-01: qty 30, 30d supply, fill #2

## 2024-04-03 ENCOUNTER — Other Ambulatory Visit (HOSPITAL_BASED_OUTPATIENT_CLINIC_OR_DEPARTMENT_OTHER): Payer: Self-pay

## 2024-04-22 ENCOUNTER — Other Ambulatory Visit (HOSPITAL_BASED_OUTPATIENT_CLINIC_OR_DEPARTMENT_OTHER): Payer: Self-pay

## 2024-04-22 ENCOUNTER — Other Ambulatory Visit (HOSPITAL_COMMUNITY): Payer: Self-pay

## 2024-04-22 ENCOUNTER — Telehealth: Payer: Self-pay | Admitting: Pharmacy Technician

## 2024-04-22 ENCOUNTER — Ambulatory Visit: Payer: Self-pay | Admitting: Medical

## 2024-04-22 ENCOUNTER — Ambulatory Visit (INDEPENDENT_AMBULATORY_CARE_PROVIDER_SITE_OTHER): Admitting: Medical

## 2024-04-22 ENCOUNTER — Ambulatory Visit (HOSPITAL_BASED_OUTPATIENT_CLINIC_OR_DEPARTMENT_OTHER)
Admission: RE | Admit: 2024-04-22 | Discharge: 2024-04-22 | Disposition: A | Discharge status: Home / Self Care | Source: Ambulatory Visit | Attending: Medical | Admitting: Medical

## 2024-04-22 VITALS — BP 126/80 | HR 89 | Temp 98.2°F | Resp 14 | Ht 70.0 in | Wt 202.2 lb

## 2024-04-22 DIAGNOSIS — M5416 Radiculopathy, lumbar region: Secondary | ICD-10-CM

## 2024-04-22 DIAGNOSIS — Z7985 Long-term (current) use of injectable non-insulin antidiabetic drugs: Secondary | ICD-10-CM

## 2024-04-22 DIAGNOSIS — M25511 Pain in right shoulder: Secondary | ICD-10-CM

## 2024-04-22 DIAGNOSIS — E1165 Type 2 diabetes mellitus with hyperglycemia: Secondary | ICD-10-CM | POA: Diagnosis not present

## 2024-04-22 DIAGNOSIS — F419 Anxiety disorder, unspecified: Secondary | ICD-10-CM

## 2024-04-22 DIAGNOSIS — I10 Essential (primary) hypertension: Secondary | ICD-10-CM | POA: Diagnosis not present

## 2024-04-22 DIAGNOSIS — F32A Depression, unspecified: Secondary | ICD-10-CM

## 2024-04-22 DIAGNOSIS — Z79899 Other long term (current) drug therapy: Secondary | ICD-10-CM

## 2024-04-22 DIAGNOSIS — G8929 Other chronic pain: Secondary | ICD-10-CM

## 2024-04-22 DIAGNOSIS — E876 Hypokalemia: Secondary | ICD-10-CM

## 2024-04-22 LAB — COMPLETE METABOLIC PANEL WITHOUT GFR
AG Ratio: 1.5 (calc) (ref 1.0–2.5)
ALT: 17 U/L (ref 9–46)
AST: 21 U/L (ref 10–40)
Albumin: 4.5 g/dL (ref 3.6–5.1)
Alkaline phosphatase (APISO): 76 U/L (ref 36–130)
BUN: 11 mg/dL (ref 7–25)
CO2: 29 mmol/L (ref 20–32)
Calcium: 9.7 mg/dL (ref 8.6–10.3)
Chloride: 100 mmol/L (ref 98–110)
Creat: 1.24 mg/dL (ref 0.60–1.29)
Globulin: 3.1 g/dL (ref 1.9–3.7)
Glucose, Bld: 84 mg/dL (ref 65–99)
Potassium: 3 mmol/L — ABNORMAL LOW (ref 3.5–5.3)
Sodium: 139 mmol/L (ref 135–146)
Total Bilirubin: 0.4 mg/dL (ref 0.2–1.2)
Total Protein: 7.6 g/dL (ref 6.1–8.1)

## 2024-04-22 LAB — MICROALBUMIN / CREATININE URINE RATIO
Creatinine,U: 533.8 mg/dL
Microalb Creat Ratio: 6.1 mg/g (ref 0.0–30.0)
Microalb, Ur: 3.3 mg/dL — ABNORMAL HIGH (ref 0.0–1.9)

## 2024-04-22 LAB — HEMOGLOBIN A1C
Hgb A1c MFr Bld: 6.2 % — ABNORMAL HIGH (ref <5.7)
Mean Plasma Glucose: 131 mg/dL
eAG (mmol/L): 7.3 mmol/L

## 2024-04-22 MED ORDER — TRAMADOL HCL 50 MG PO TABS
50.0000 mg | ORAL_TABLET | Freq: Every evening | ORAL | 0 refills | Status: DC | PRN
  Filled 2024-04-22: qty 30, 30d supply, fill #0

## 2024-04-22 MED ORDER — TIRZEPATIDE 2.5 MG/0.5ML ~~LOC~~ SOAJ
2.5000 mg | SUBCUTANEOUS | 0 refills | Status: DC
  Filled 2024-04-22: qty 2, 28d supply, fill #0

## 2024-04-22 NOTE — Patient Instructions (Addendum)
 Type 2 diabetes mellitus with hyperglycemia Type 2 diabetes with previously elevated A1c of 9.0%. Off Mounjaro  for two weeks due to insurance issues. Discussed risks of pancreatitis with starting on  high-dose GLP-1 receptor agonists and importance of starting at a low dose. Insurance coverage resumed. - Mounjaro  0.25 mg injection rx sent. Will see if needs prior auth. - Ordered A1c and kidney function tests.  Essential hypertension Hypertension well-controlled with amlodipine  10 mg daily and valsartan  20 mg daily.  Chronic low back pain with lumbar radiculopathy Chronic low back pain with radiculopathy, shooting pain down the leg lasting 30-40 seconds, occurring 3-4 times daily, especially after prolonged sitting. Pain present for over ten years with recent exacerbation. Tramadol  effective for pain management and sleep.(use ibuprofen  low dose during the day if needed) - Ordered x-ray of lumbar spine. - Prescribed tramadol  with controlled medication contract and urine drug screen. - Provided  drug screen and sign-controlled med contract.  Chronic right shoulder pain, post-surgical Chronic right shoulder pain post-surgery, exacerbated by strenuous activities. Pain is lifelong due to previous surgery with a 15% permanent disability. - Prescribed tramadol  for pain management.  Insomnia Persists despite melatonin use.  due to its sedative effects. - Recommended magnesium  glycinate 200 mg over-the-counter for sleep.  Follow up date to be determined after lab and xray review

## 2024-04-22 NOTE — Progress Notes (Signed)
 Subjective:    Patient ID: Brett Allen, male    DOB: January 09, 1976, 48 y.o.   MRN: [id number]  HPI  Brett Allen is a 48 year old male with hypertension and diabetes who presents with back pain and medication management.  He has experienced significant back pain for over ten years, with a recent exacerbation over the past month. The pain is described as shooting down his leg from the knee, occurring three to four times a day, particularly after sitting for about an hour. The pain lasts for 30 to 40 seconds and resolves spontaneously. No recent injury is reported, but the pain disrupts his sleep.  He has a history of shoulder pain following an injury last November, for which he underwent surgery. The shoulder pain persists, especially during strenuous activities like washing the car or vacuuming. He uses tramadol , typically one tablet at night, which helps manage both his shoulder and back pain and aids in sleep. He also uses ibuprofen  as needed, ensuring not to take both medications simultaneously.  His hypertension is managed with amlodipine  10 mg daily and valsartan  20 mg daily, with a recent blood pressure reading of 126/80 mmHg. He also has diabetes, with a previous A1c of 9.0. He was on Mounjaro  10 mg but ran out two weeks ago due to insurance issues, sparingly using his last supply over October to extend the interval between doses.  He reports a significant improvement in mood and anxiety, attributing this to resolving financial stress and receiving a settlement. He previously used Buspar  but discontinued it due to adverse effects. No current use of any anxiety or depression medications. Despite using melatonin, he experiences poor sleep quality, though tramadol  helps him sleep due to its drowsiness side effect    Review of Systems  See hpi      Objective:   Physical Exam  General Mental Status- Alert. General Appearance- Not in acute distress.   Skin General: Color-  Normal Color. Moisture- Normal Moisture.  Neck No JVD.  Chest and Lung Exam Auscultation: Breath Sounds:-CTA  Cardiovascular Auscultation:Rythm- RRR Murmurs & Other Heart Sounds:Auscultation of the heart reveals- No Murmurs.  Abdomen Inspection:-Inspeection Normal. Palpation/Percussion:Note:No mass. Palpation and Percussion of the abdomen reveal- Non Tender, Non Distended + BS, no rebound or guarding.   Neurologic Cranial Nerve exam:- CN III-XII intact(No nystagmus), symmetric smile. Strength:- 5/5 equal and symmetric strength both upper and lower extremities.       Assessment & Plan:    Patient Instructions  Type 2 diabetes mellitus with hyperglycemia Type 2 diabetes with previously elevated A1c of 9.0%. Off Mounjaro  for two weeks due to insurance issues. Discussed risks of pancreatitis with starting on  high-dose GLP-1 receptor agonists and importance of starting at a low dose. Insurance coverage resumed. - Mounjaro  0.25 mg injection rx sent. Will see if needs prior auth. - Ordered A1c and kidney function tests.  Essential hypertension Hypertension well-controlled with amlodipine  10 mg daily and valsartan  20 mg daily.  Chronic low back pain with lumbar radiculopathy Chronic low back pain with radiculopathy, shooting pain down the leg lasting 30-40 seconds, occurring 3-4 times daily, especially after prolonged sitting. Pain present for over ten years with recent exacerbation. Tramadol  effective for pain management and sleep.(use ibuprofen  low dose during the day if needed) - Ordered x-ray of lumbar spine. - Prescribed tramadol  with controlled medication contract and urine drug screen. - Provided  drug screen and sign-controlled med contract.  Chronic right shoulder pain, post-surgical Chronic right  shoulder pain post-surgery, exacerbated by strenuous activities. Pain is lifelong due to previous surgery with a 15% permanent disability. - Prescribed tramadol  for pain  management.  Insomnia Persists despite melatonin use.  due to its sedative effects. - Recommended magnesium  glycinate 200 mg over-the-counter for sleep.  Follow up date to be determined after lab and xray review  D[place] Maxwell, PA-C

## 2024-04-22 NOTE — Telephone Encounter (Signed)
 Pharmacy Patient Advocate Encounter   Received notification from CoverMyMeds that prior authorization for Mounjaro  2.5MG /0.5ML auto-injectors  is required/requested.   Insurance verification completed.   The patient is insured through [employer]. Key: [id number]  Prior Authorization form/request asks a question that requires your assistance. Please see the question below and advise accordingly. The PA will not be submitted until the necessary information is received.     **Medicaid requires patients to have tried at least 2 of the preferred that is listed for approval. I could only find that he has tried Ozempic . Please advise.**

## 2024-04-23 ENCOUNTER — Other Ambulatory Visit (HOSPITAL_BASED_OUTPATIENT_CLINIC_OR_DEPARTMENT_OTHER): Payer: Self-pay

## 2024-04-23 MED ORDER — OZEMPIC (0.25 OR 0.5 MG/DOSE) 2 MG/3ML ~~LOC~~ SOPN
0.2500 mg | PEN_INJECTOR | SUBCUTANEOUS | 0 refills | Status: DC
  Filled 2024-04-23: qty 3, 56d supply, fill #0

## 2024-04-23 MED ORDER — POTASSIUM CHLORIDE CRYS ER 10 MEQ PO TBCR
10.0000 meq | EXTENDED_RELEASE_TABLET | Freq: Every day | ORAL | 0 refills | Status: DC
  Filled 2024-04-23: qty 7, 7d supply, fill #0

## 2024-04-23 NOTE — Addendum Note (Signed)
 Addended by: DORINA DALLAS HERO on: 04/23/2024 07:05 PM   Modules accepted: Orders

## 2024-04-23 NOTE — Addendum Note (Signed)
 Addended by: DORINA DALLAS HERO on: 04/23/2024 06:33 PM   Modules accepted: Orders

## 2024-04-24 ENCOUNTER — Other Ambulatory Visit (HOSPITAL_COMMUNITY): Payer: Self-pay

## 2024-04-24 ENCOUNTER — Telehealth: Payer: Self-pay | Admitting: Pharmacy Technician

## 2024-04-24 ENCOUNTER — Other Ambulatory Visit (HOSPITAL_BASED_OUTPATIENT_CLINIC_OR_DEPARTMENT_OTHER): Payer: Self-pay

## 2024-04-24 ENCOUNTER — Other Ambulatory Visit: Payer: Self-pay

## 2024-04-24 LAB — DRUG MONITORING PANEL 376104, URINE
Amphetamines: NEGATIVE ng/mL (ref <500)
Barbiturates: NEGATIVE ng/mL (ref <300)
Benzodiazepines: NEGATIVE ng/mL (ref <100)
Cocaine Metabolite: NEGATIVE ng/mL (ref <150)
Desmethyltramadol: NEGATIVE ng/mL (ref <100)
Opiates: NEGATIVE ng/mL (ref <100)
Oxycodone: NEGATIVE ng/mL (ref <100)
Tramadol: NEGATIVE ng/mL (ref <100)

## 2024-04-24 LAB — DM TEMPLATE

## 2024-04-24 NOTE — Telephone Encounter (Signed)
 Pharmacy Patient Advocate Encounter  Received notification from [employer] that Prior Authorization for Ozempic  (0.25 or 0.5 MG/DOSE) 2MG /3ML pen-injectors  has been APPROVED from 04/24/24 to 04/24/25. Ran test claim, Copay is $4.00. This test claim was processed through [facility]- copay amounts may vary at other pharmacies due to pharmacy/plan contracts, or as the patient moves through the different stages of their insurance plan.   PA #/Case ID/Reference #: [id number]

## 2024-04-24 NOTE — Telephone Encounter (Signed)
 Pharmacy Patient Advocate Encounter   Received notification from Pt Calls Messages that prior authorization for Ozempic  (0.25 or 0.5 MG/DOSE) 2MG /3ML pen-injectors  is required/requested.   Insurance verification completed.   The patient is insured through [employer].   Per test claim: PA required; PA submitted to above mentioned insurance via Latent Key/confirmation #/EOC [id number] Status is pending

## 2024-04-24 NOTE — Telephone Encounter (Signed)
 Claudene Joya PARAS, CPhT SS   04/24/24 12:10 PM Note Pharmacy Patient Advocate Encounter   Received notification from [employer] that Prior Authorization for Ozempic  (0.25 or 0.5 MG/DOSE) 2MG /3ML pen-injectors  has been APPROVED from 04/24/24 to 04/24/25. Ran test claim, Copay is $4.00. This test claim was processed through [facility]- copay amounts may vary at other pharmacies due to pharmacy/plan contracts, or as the patient moves through the different stages of their insurance plan.    PA #/Case ID/Reference #: [id number]

## 2024-04-24 NOTE — Telephone Encounter (Signed)
 The Ozempic  requires a PA. We will work on getting that one approved.

## 2024-04-25 ENCOUNTER — Other Ambulatory Visit (HOSPITAL_BASED_OUTPATIENT_CLINIC_OR_DEPARTMENT_OTHER): Payer: Self-pay

## 2024-04-25 NOTE — Telephone Encounter (Signed)
 Patient will get the ozempic  and start after he finish up the Mounjaro  that he has left.

## 2024-04-28 ENCOUNTER — Telehealth: Payer: Self-pay

## 2024-04-28 ENCOUNTER — Other Ambulatory Visit (HOSPITAL_BASED_OUTPATIENT_CLINIC_OR_DEPARTMENT_OTHER): Payer: Self-pay

## 2024-04-28 NOTE — Telephone Encounter (Signed)
 Called pt notify him that his PA was approved for the ozempic . Pt states he will pick it up today

## 2024-05-02 ENCOUNTER — Ambulatory Visit: Admitting: Medical

## 2024-05-05 ENCOUNTER — Other Ambulatory Visit (HOSPITAL_BASED_OUTPATIENT_CLINIC_OR_DEPARTMENT_OTHER): Payer: Self-pay | Admitting: Neurosurgery

## 2024-05-05 DIAGNOSIS — M4316 Spondylolisthesis, lumbar region: Secondary | ICD-10-CM

## 2024-05-05 NOTE — Therapy (Incomplete)
 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  RATIONALE FOR EVALUATION AND TREATMENT: Rehabilitation  ONSET DATE: ***04/27/24 - Neck pain;  Chronic - Lumbar pain  NEXT MD VISIT: ***   SUBJECTIVE:                                                                                                                                                                                                         SUBJECTIVE STATEMENT: ***  PAIN: Are you having pain? {OPRCPAIN:27236}  Are you having pain? 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  PRECAUTIONS: {Therapy precautions:24002}  HAND DOMINANCE: {Hand Dominance:29389}  RED FLAGS: {PT Red Flags:29287}  WEIGHT BEARING RESTRICTIONS: {Yes ***/No:24003}  FALLS:  Has patient fallen in last 6 months? {fallsyesno:27318}  LIVING ENVIRONMENT: Lives with: {[facility] lives with:25569::lives with their family} Lives in: {Lives in:25570} Stairs: {opstairs:27293} Has  following equipment at home: {Assistive devices:23999}  OCCUPATION: ***  PLOF: {PLOF:24004}  PATIENT GOALS: ***    OBJECTIVE:   DIAGNOSTIC FINDINGS:  ***Neck x-ray pending  Lumbar MRI pending  04/22/24 - DG LUMBAR SPINE - 2-3 VIEW CLINICAL DATA:  Lumbar radicular pain on the right   FINDINGS: There is no evidence of lumbar spine fracture. Degenerative changes of the spine predominantly involving the L3-L4 and, L4-L5 and L5-S1 with associated decreased intervertebral disc space and facet hypertrophy. Grade 1 anterolisthesis of L4-L5.   Soft tissues are unremarkable.   IMPRESSION: Degenerative disc disease involving L3- L4 through L5-S1.   Grade 1 anterolisthesis of L4-L5.  04/07/22 - MRI LUMBAR SPINE WITHOUT CONTRAST  CLINICAL  DATA:  Initial evaluation for lumbar degenerative disc disease.   FINDINGS:  Segmentation: Standard. Lowest well-formed disc space labeled the  L5-S1 level.   Alignment: 3 mm degenerative retrolisthesis of L4 on L5, with trace  2 mm retrolisthesis of L3 on L4. Alignment otherwise normal with  preservation of the normal lumbar lordosis.   Vertebrae: Vertebral body height maintained without acute or chronic  fracture. Bone marrow signal intensity within normal limits. No  worrisome osseous lesions. Discogenic reactive endplate changes  present about the L3-4 and L4-5 interspaces. No other abnormal  marrow edema.   Conus medullaris and cauda equina: Conus extends to the L2 level.  Conus and cauda equina appear normal.   Paraspinal and other soft tissues: Unremarkable.   Disc levels:   L1-2:  Unremarkable.   L2-3:  Unremarkable.   L3-4: Degenerative intervertebral disc space narrowing with  circumferential disc bulge and disc desiccation. Associated reactive  endplate change with marginal endplate osteophytic spurring. Mild  facet hypertrophy. No significant spinal stenosis. Mild bilateral L3  foraminal narrowing. No frank impingement.    L4-5: Degenerative with abscesses narrowing with diffuse disc bulge  and disc desiccation. Associated reactive endplate spurring. Mild  facet and ligament flavum hypertrophy. No significant spinal  stenosis. Mild right with mild to moderate left L4 foraminal  stenosis.   L5-S1: Mild diffuse disc bulge with endplate spurring. Mild  bilateral facet hypertrophy. No spinal stenosis. Mild bilateral L5  foraminal narrowing. No frank impingement.   IMPRESSION:  1. Lower lumbar degenerative disc disease at L3-4 through L5-S1 with  resultant mild to moderate bilateral L3 through L5 foraminal  stenosis as above. No significant spinal stenosis or overt neural  impingement.  2. Mild bilateral facet hypertrophy at L3-4 through L5-S1.   PATIENT SURVEYS:  {rehab surveys:24030}  SCREENING FOR RED FLAGS: Bowel or bladder incontinence: {Yes/No:304960894} Spinal tumors: {Yes/No:304960894} Cauda equina syndrome: {Yes/No:304960894} Compression fracture: {Yes/No:304960894} Abdominal aneurysm: {Yes/No:304960894}  COGNITION: Overall cognitive status: {cognition:24006}     SENSATION: {sensation:27233}  POSTURE:  {posture:25561}  PALPATION: ***  CERVICAL ROM:   {AROM/PROM:27142} ROM Eval  Flexion   Extension   Right lateral flexion   Left lateral flexion   Right rotation   Left rotation     (Blank rows = not tested)  UPPER EXTREMITY ROM:  {AROM/PROM:27142} ROM Right eval Left eval  Shoulder flexion    Shoulder extension    Shoulder abduction    Shoulder adduction    Shoulder internal rotation    Shoulder external rotation    Elbow flexion    Elbow extension    Wrist flexion    Wrist extension    Wrist ulnar deviation    Wrist radial deviation    Wrist pronation    Wrist supination      (Blank rows = not tested)  UPPER EXTREMITY MMT:  MMT Right eval Left eval  Shoulder flexion    Shoulder extension    Shoulder abduction    Shoulder adduction    Shoulder internal  rotation    Shoulder external rotation    Middle trapezius    Lower trapezius    Elbow flexion    Elbow extension    Wrist flexion    Wrist extension    Wrist ulnar deviation    Wrist radial deviation    Wrist pronation    Wrist supination    Grip strength    (Blank rows = not tested)  LUMBAR ROM:   {AROM/PROM:27142}  Eval  Flexion  Extension   Right lateral flexion   Left lateral flexion   Right rotation   Left rotation     (Blank rows = not tested)  MUSCLE LENGTH: Hamstrings: Right *** deg; Left *** deg Debby test: Right *** deg; Left *** deg Hamstrings: *** ITB: *** Piriformis: *** Hip flexors: *** Quads: *** Heelcord: ***  LOWER EXTREMITY ROM:     {AROM/PROM:27142}  Right eval Left eval  Hip flexion    Hip extension    Hip abduction    Hip adduction    Hip internal rotation    Hip external rotation    Knee flexion    Knee extension    Ankle dorsiflexion    Ankle plantarflexion    Ankle inversion    Ankle eversion     (Blank rows = not tested)  LOWER EXTREMITY MMT:    MMT Right eval Left eval  Hip flexion    Hip extension    Hip abduction    Hip adduction    Hip internal rotation    Hip external rotation    Knee flexion    Knee extension    Ankle dorsiflexion    Ankle plantarflexion    Ankle inversion    Ankle eversion      (Blank rows = not tested)  CERVICAL SPECIAL TESTS:  {Cervical special tests:25246}  LUMBAR SPECIAL TESTS:  {lumbar special test:25242}  FUNCTIONAL TESTS:  {Functional tests:24029}  GAIT: Distance walked: *** Assistive device utilized: {Assistive devices:23999} Level of assistance: {Levels of assistance:24026} Gait pattern: {gait characteristics:25376} Comments: ***   TODAY'S TREATMENT:   ***05/05/2024 - Eval SELF CARE:  Reviewed eval findings and role of PT in addressing identified deficits as well as instruction in initial HEP (see below).    PATIENT EDUCATION:  Education details: {Education  details:27468}  Person educated: {Person educated:25204} Education method: {Education Method [employer] Education comprehension: {Education Comprehension:25206}  HOME EXERCISE PROGRAM: ***   ASSESSMENT:  CLINICAL IMPRESSION: Rasmus T Goodell is a 48 y.o. male who was referred to physical therapy for evaluation and treatment for acute neck and chronic lumbar pain.  ***   Patient reports onset of *** pain beginning ***. Pain is worse with ***.  Patient has deficits in *** ROM, *** flexibility, *** strength, ***abnormal posture, and TTP with abnormal muscle tension *** which are interfering with ADLs and are impacting quality of life.  On NDI patient scored ***/50 demonstrating ***% or *** disability.  On Modified Oswestry patient scored ***/50 demonstrating ***% or *** disability.  Kyrin will benefit from skilled PT to address above deficits to improve mobility and activity tolerance with decreased pain interference.  OBJECTIVE IMPAIRMENTS: {opptimpairments:25111}.   ACTIVITY LIMITATIONS: {activitylimitations:27494}  PARTICIPATION LIMITATIONS: {participationrestrictions:25113}  PERSONAL FACTORS: {Personal factors:25162} are also affecting patient's functional outcome.   REHAB POTENTIAL: {rehabpotential:25112}  CLINICAL DECISION MAKING: {clinical decision making:25114}  EVALUATION COMPLEXITY: {Evaluation complexity:25115}   GOALS: Goals reviewed with patient? {yes/no:20286}  SHORT TERM GOALS: Target date: ***  Patient will be independent with initial HEP to improve outcomes and carryover.  Baseline: *** Goal status: {GOALSTATUS:25110}  2.  Patient will report 25% improvement in neck and *** back pain to improve QOL. Baseline: *** Goal status: {GOALSTATUS:25110}  3.  *** Baseline: *** Goal status: {GOALSTATUS:25110}  LONG TERM GOALS: Target date: ***  Patient will be independent with ongoing/advanced HEP for self-management at home.  Baseline: *** Goal status:  {GOALSTATUS:25110}  2.  Patient will report 50-75% improvement in neck and *** back pain to  improve QOL.  Baseline: *** Goal status: {GOALSTATUS:25110}  3.  Patient will demonstrate improved posture to decrease muscle imbalance. Baseline: *** Goal status: {GOALSTATUS:25110}  4.  Patient to demonstrate ability to achieve and maintain good spinal alignment and body mechanics needed for daily activities. Baseline: *** Goal status: {GOALSTATUS:25110}  5.  Patient will demonstrate full pain free cervical ROM for safety with driving.  Baseline: Refer to above cervical ROM table Goal status: {GOALSTATUS:25110}  6.  Patient will demonstrate full pain free lumbar ROM to perform ADLs.   Baseline: Refer to above lumbar ROM table Goal status: {GOALSTATUS:25110}  7.  Patient will demonstrate improved functional strength as demonstrated by improved ***UE/LE***strength to >/= ***/5. Baseline: Refer to above ***UE/LE*** MMT tables Goal status: {GOALSTATUS:25110}  8.  Patient will report </= ***% on NDI (MCID = 22%) to demonstrate improved functional ability.  Baseline: *** Goal status: {GOALSTATUS:25110}   9.  Patient will report </= ***% on Modified Oswestry (MCID = 12%) to demonstrate improved functional ability.  Baseline:  Goal status: {GOALSTATUS:25110}  10.  Patient to report ability to perform ADLs, household, and work-related tasks without limitation due to *** pain, LOM or weakness Baseline: *** Goal status: {GOALSTATUS:25110}   11.  Patient will tolerate *** min of (standing/sitting/walking) to perform ***. Baseline: *** Goal status: {GOALSTATUS:25110}  12.  Patient will report centralization of radicular symptoms.  Baseline: *** Goal status: {GOALSTATUS:25110}  13.  *** Baseline: *** Goal status: {GOALSTATUS:25110}   PLAN:  PT FREQUENCY: {rehab frequency:25116}  PT DURATION: {rehab duration:25117}  PLANNED INTERVENTIONS: {rehab planned  interventions:25118::97110-Therapeutic exercises,97530- Therapeutic [phone]- Neuromuscular re-education,97535- Self Rjmz,02859- Manual therapy,Patient/Family education}  PLAN FOR NEXT SESSION: ***   Elijah CHRISTELLA Hidden, PT 05/05/2024, 9:12 AM

## 2024-05-06 ENCOUNTER — Ambulatory Visit (HOSPITAL_BASED_OUTPATIENT_CLINIC_OR_DEPARTMENT_OTHER)
Admission: RE | Admit: 2024-05-06 | Discharge: 2024-05-06 | Disposition: A | Discharge status: Home / Self Care | Source: Ambulatory Visit | Attending: Neurosurgery | Admitting: Neurosurgery

## 2024-05-06 ENCOUNTER — Ambulatory Visit (HOSPITAL_BASED_OUTPATIENT_CLINIC_OR_DEPARTMENT_OTHER)

## 2024-05-06 DIAGNOSIS — M4316 Spondylolisthesis, lumbar region: Secondary | ICD-10-CM | POA: Insufficient documentation

## 2024-05-07 ENCOUNTER — Ambulatory Visit: Admitting: Medical

## 2024-05-07 ENCOUNTER — Other Ambulatory Visit (HOSPITAL_BASED_OUTPATIENT_CLINIC_OR_DEPARTMENT_OTHER): Payer: Self-pay

## 2024-05-07 VITALS — BP 112/88 | HR 83 | Temp 98.0°F | Resp 15 | Ht 70.0 in | Wt 201.0 lb

## 2024-05-07 DIAGNOSIS — S46812A Strain of other muscles, fascia and tendons at shoulder and upper arm level, left arm, initial encounter: Secondary | ICD-10-CM

## 2024-05-07 DIAGNOSIS — E876 Hypokalemia: Secondary | ICD-10-CM

## 2024-05-07 LAB — COMPREHENSIVE METABOLIC PANEL WITH GFR
ALT: 19 U/L (ref 0–53)
AST: 20 U/L (ref 0–37)
Albumin: 4.6 g/dL (ref 3.5–5.2)
Alkaline Phosphatase: 80 U/L (ref 39–117)
BUN: 14 mg/dL (ref 6–23)
CO2: 33 meq/L — ABNORMAL HIGH (ref 19–32)
Calcium: 9.8 mg/dL (ref 8.4–10.5)
Chloride: 101 meq/L (ref 96–112)
Creatinine, Ser: 1.32 mg/dL (ref 0.40–1.50)
GFR: 64.93 mL/min (ref >60.00)
Glucose, Bld: 80 mg/dL (ref 70–99)
Potassium: 3.3 meq/L — ABNORMAL LOW (ref 3.5–5.1)
Sodium: 141 meq/L (ref 135–145)
Total Bilirubin: 0.5 mg/dL (ref 0.2–1.2)
Total Protein: 7.7 g/dL (ref 6.0–8.3)

## 2024-05-07 MED ORDER — OZEMPIC (0.25 OR 0.5 MG/DOSE) 2 MG/3ML ~~LOC~~ SOPN
0.5000 mg | PEN_INJECTOR | SUBCUTANEOUS | 0 refills | Status: DC
  Filled 2024-05-07 – 2024-05-12 (×2): qty 3, 30d supply, fill #0

## 2024-05-07 MED ORDER — CYCLOBENZAPRINE HCL 10 MG PO TABS
10.0000 mg | ORAL_TABLET | Freq: Every evening | ORAL | 0 refills | Status: DC | PRN
  Filled 2024-05-07: qty 3, 3d supply, fill #0

## 2024-05-07 MED ORDER — METHYLPREDNISOLONE 4 MG PO TABS
ORAL_TABLET | ORAL | 0 refills | Status: AC
  Filled 2024-05-07: qty 6, 3d supply, fill #0

## 2024-05-07 NOTE — Patient Instructions (Addendum)
 Left trapezius muscle pain Intermittent left trapezius pain likely due to muscle strain or spasm, possibly related to sleeping position. Nerve pain less likely. No cervical spine imaging needed. - Prescribed Medrol  4 mg taper for 6 days. - Prescribed Flexeril  10 mg for 3 nights. - Advised at least 8 hours of sleep with Flexeril . - Instructed to update on pain status late next week or early following week.  Type 2 diabetes mellitus Recent average blood glucose improved to 130 mg/dL. Current treatment with Ozempic  0.25 mg, plan to increase to 0.5 mg due to insurance constraints. - Increased Ozempic  to 0.5 mg after current supply of 0.25 mg is exhausted.  follow up date to be determined after update 7-10 days on if neck pain returns

## 2024-05-07 NOTE — Progress Notes (Signed)
   Subjective:    Patient ID: Moe T Cassada, male    DOB: 1975-09-29, 48 y.o.   MRN: [id number]  HPI  Arieon T Cravens is a 48 year old male with chronic back pain and diabetes who presents with acute neck pain.  He has experienced neck pain for three weeks, which began suddenly after turning his head upon getting out of bed. The pain radiates from the neck to the shoulder when turning his head to the left and is absent when turning to the right. It is described as throbbing and can last all day, though it fluctuates in intensity. Heat application and Tylenol  provide some relief, but the pain persists at a lower intensity. No muscle spasms are present.  No current pain on exam with movement. Last time had pain that was severe was Monday.  He has chronic back pain and a work-related shoulder injury, for which he takes tramadol , reducing his pain significantly. He recently had an MRI and is scheduled to start physical therapy for sciatica-related lower back pain.  He is diabetic, with recent blood sugar levels averaging around 120. He switched from Mounjaro  to Ozempic  due to insurance issues, starting at the 0.25 mg dose. He completed a course of potassium tablets for low potassium levels. His orthopedic doctor prescribed cyclobenzaprine , but it was not filled. He has used Flexeril  in the past for pain management.      Review of Systems See hpi    Objective:   Physical Exam  General- No acute distress. Pleasant patient. Neck- Full range of motion, no jvd. No mid c spine pain. No pain on range of motion. Mild tender to palpation where trapezius inserts into left side occiptal area. Lungs- Clear, even and unlabored. Heart- regular rate and rhythm. Neurologic- CNII- XII grossly intact.        Assessment & Plan:   Patient Instructions  Left trapezius muscle pain Intermittent left trapezius pain likely due to muscle strain or spasm, possibly related to sleeping position. Nerve  pain less likely. No cervical spine imaging needed. - Prescribed Medrol 4 mg taper for 6 days. - Prescribed Flexeril  10 mg for 3 nights. - Advised at least 8 hours of sleep with Flexeril . - Instructed to update on pain status late next week or early following week.  Type 2 diabetes mellitus Recent average blood glucose improved to 130 mg/dL. Current treatment with Ozempic  0.25 mg, plan to increase to 0.5 mg due to insurance constraints. - Increased Ozempic  to 0.5 mg after current supply of 0.25 mg is exhausted.  follow up date to be determined after update 7-10 days on if neck pain returns   Niley Helbig, PA-C

## 2024-05-08 ENCOUNTER — Ambulatory Visit: Payer: Self-pay | Admitting: Medical

## 2024-05-08 ENCOUNTER — Other Ambulatory Visit (HOSPITAL_BASED_OUTPATIENT_CLINIC_OR_DEPARTMENT_OTHER): Payer: Self-pay

## 2024-05-08 ENCOUNTER — Ambulatory Visit: Attending: Neurosurgery | Admitting: Physical Therapy

## 2024-05-08 DIAGNOSIS — M542 Cervicalgia: Secondary | ICD-10-CM | POA: Insufficient documentation

## 2024-05-08 DIAGNOSIS — M5441 Lumbago with sciatica, right side: Secondary | ICD-10-CM | POA: Insufficient documentation

## 2024-05-08 DIAGNOSIS — M6281 Muscle weakness (generalized): Secondary | ICD-10-CM | POA: Insufficient documentation

## 2024-05-08 DIAGNOSIS — G8929 Other chronic pain: Secondary | ICD-10-CM | POA: Insufficient documentation

## 2024-05-08 DIAGNOSIS — R293 Abnormal posture: Secondary | ICD-10-CM | POA: Insufficient documentation

## 2024-05-08 DIAGNOSIS — R2689 Other abnormalities of gait and mobility: Secondary | ICD-10-CM | POA: Insufficient documentation

## 2024-05-08 MED ORDER — POTASSIUM CHLORIDE CRYS ER 20 MEQ PO TBCR
20.0000 meq | EXTENDED_RELEASE_TABLET | Freq: Every day | ORAL | 0 refills | Status: DC
  Filled 2024-05-08: qty 7, 7d supply, fill #0

## 2024-05-08 NOTE — Addendum Note (Signed)
 Addended by: DORINA DALLAS HERO on: 05/08/2024 06:35 PM   Modules accepted: Orders

## 2024-05-12 ENCOUNTER — Other Ambulatory Visit (HOSPITAL_BASED_OUTPATIENT_CLINIC_OR_DEPARTMENT_OTHER): Payer: Self-pay

## 2024-05-13 ENCOUNTER — Ambulatory Visit: Admitting: Physical Therapy

## 2024-05-13 ENCOUNTER — Other Ambulatory Visit: Payer: Self-pay

## 2024-05-13 ENCOUNTER — Encounter: Payer: Self-pay | Admitting: Physical Therapy

## 2024-05-13 DIAGNOSIS — R293 Abnormal posture: Secondary | ICD-10-CM | POA: Diagnosis present

## 2024-05-13 DIAGNOSIS — R2689 Other abnormalities of gait and mobility: Secondary | ICD-10-CM

## 2024-05-13 DIAGNOSIS — M6281 Muscle weakness (generalized): Secondary | ICD-10-CM | POA: Diagnosis present

## 2024-05-13 DIAGNOSIS — M542 Cervicalgia: Secondary | ICD-10-CM

## 2024-05-13 DIAGNOSIS — G8929 Other chronic pain: Secondary | ICD-10-CM | POA: Diagnosis present

## 2024-05-13 DIAGNOSIS — M5441 Lumbago with sciatica, right side: Secondary | ICD-10-CM | POA: Diagnosis present

## 2024-05-13 NOTE — Therapy (Addendum)
 OUTPATIENT PHYSICAL THERAPY CERVICAL AND THORACOLUMBAR EVALUATION   Patient Name: Brett Allen MRN: [id number] DOB:19-Sep-1975, 48 y.o., male Today's Date: 05/13/2024   END OF SESSION:  PT End of Session - 05/13/24 0846     Visit Number 1    Date for Recertification  07/08/24    Authorization Type [employer]    Authorization Time Period 05/13/24 - 08/11/24    Authorization - Visit Number 1    Authorization - Number of Visits 14    PT Start Time 0846    PT Stop Time 1010    PT Time Calculation (min) 84 min    Activity Tolerance Patient limited by pain    Behavior During Therapy [facility] for tasks assessed/performed          Past Medical History:  Diagnosis Date   Back pain    lower   Cellulitis    Diabetes mellitus without complication (HCC)    Hypertension    Sarcoidosis    right eye   Past Surgical History:  Procedure Laterality Date   left wrsit surgery Left    WRIST FRACTURE SURGERY Left 2017   Patient Active Problem List   Diagnosis Date Noted   Cellulitis 10/27/2022   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   Chronic bilateral low back pain without sciatica 07/23/2015   Hyperlipidemia 07/09/2015    PCP: Dorina Loving, PA-C   REFERRING PROVIDER: Gillie Duncans, MD   REFERRING DIAG: M54.2 (ICD-10-CM) - Cervicalgia  Lumbar & Cervical pain  THERAPY DIAG:  Cervicalgia  Chronic right-sided low back pain with right-sided sciatica  Muscle weakness (generalized)  Abnormal posture  Other abnormalities of gait and mobility  RATIONALE FOR EVALUATION AND TREATMENT: Rehabilitation  ONSET DATE: 04/27/24 - Neck pain;  Chronic - Lumbar pain  NEXT MD VISIT: 05/19/24   SUBJECTIVE:                                                                                                                                                                                                         SUBJECTIVE STATEMENT: Pt reports continued limitations in R  shoulder since his surgery on 05/15/23.  He reports he reinjured his shoulder during work hardening/conditioning while preparing to return to work.  He states he has been given a 15% disability because of this which has kept him out of work.  His neck pain has been bad for about a 1 month, triggered as he went to brush his teeth one morning.  Pain comes and goes but throbs all day long when present,  limiting his ability to turn his head and chew.  Low back pain has been chronic for 17+ years (since moving to [place] in 2008).  Has h/o chiropractor work for his back - seen 3-4x in past month with ~3 visits remaining.  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  Feels like he is crooked and leaning forward when he walks.   PAIN: Are you having pain? Yes: NPRS scale: always 7/10, up to 10/10 at worst  Pain location: midline low back to R, electric shock down R LE to knee and ankle starting ~2 months ago (trigger by prolonged sitting and standing)  Pain description: stabbing, locks up at times  Aggravating factors: prolonged sitting or standing, rising from bed in the morning  Relieving factors: meds take the edge off, heat, chiropractor - nothing really lasts   Are you having pain? Yes: NPRS scale: 5/10, up to 10/10 at worst  Pain location: L lateral neck & upper shoulder  Pain description: throbbing on a normal day, sharp stabbing on bad day with massive headache  Aggravating factors: turning head to L, lying in awkward positions (esp on L side)   Relieving factors: heat, compression, meds (flexeril  at night, ibuprofen )   Are you having pain? Yes: NPRS scale: 5/10, up to 10/10 at worst  Pain location: R anterior shoulder  Pain description: stinging, crunching, occasional numbness, TTP, throbbing  Aggravating factors: end range ER, lifting, sports  Relieving factors: ice, ibuprofen     PERTINENT HISTORY:  R shoulder surgery 05/15/23, chronic R shoulder dislocation/subluxation, excision of neck tumor 04/02/23, DM, HTN, sarcoidosis, chronic LBP    PRECAUTIONS: None  HAND DOMINANCE: Right  RED FLAGS: None  WEIGHT BEARING RESTRICTIONS: No  FALLS:  Has patient fallen in last 6 months? No  LIVING ENVIRONMENT: Lives with: lives with their family Lives in: House Stairs: Yes: Internal: 10-12 steps; on right going up and External: 1 steps; none Has following equipment at home: None  OCCUPATION: Unemployed  PLOF: Independent with gait, Independent with transfers, Needs assistance with ADLs, and Leisure: walking; play video games, play basketball - currently unable  PATIENT GOALS: Some type of relief of my pain.    OBJECTIVE:   DIAGNOSTIC FINDINGS:  Neck x-ray unavailable  05/06/24 - MRI LUMBAR SPINE CLINICAL HISTORY: 48 year old male with spondylolisthesis, lumbar region.   FINDINGS:   BONES AND ALIGNMENT: Normal lumbar segmentation on the comparison radiographs. Stable lordosis since 2023. Mild degenerative retrolisthesis from L3-L4 through L5-S1. Normal background bone marrow signal. Chronic degenerative endplate marrow signal changes at L3-L4 and L4-L5, with superimposed degenerative appearing left lateral endplate marrow edema (left greater than right) at L3-L4 (series 6 image 11). No associated paraspinal soft tissue inflammation. No other marrow edema.   SPINAL CORD: Normal conus medullaris at L1-L2. No signal abnormality in the visible lower thoracic spinal cord or conus.   SOFT TISSUES: No paraspinal mass.   DEGENERATIVE:   Visible lower thoracic spine through L2-L3 are negative. L3-L4: Chronic severe disc space loss. Circumferential disc osteophyte complex. Degenerative appearing endplate marrow edema. Mild to moderate facet and ligamentum flavum hypertrophy. No spinal or lateral recess stenosis. Mild to moderate bilateral L3  neural foraminal stenosis appears stable. L4-L5: Similar advanced chronic disc degeneration and disc space loss. Circumferential disc bulge with mild endplate spurring. Mild posterior element hypertrophy. No spinal or lateral recess stenosis. Moderate left and mild right L4 neural  foraminal stenosis (series 5 image 11 on the left). This level appears stable. L5-S1: Mild chronic retrolisthesis with better preserved disc height and disc signal. Mild circumferential disc bulge. Mild to moderate facet hypertrophy. No spinal or lateral recess stenosis. Mild to moderate bilateral L5 neural foraminal stenosis. This level is stable.   IMPRESSION: 1. Very age-advanced lumbar disc and endplate degeneration at L3-L4 and L4-L5, with superimposed mild degenerative-appearing endplate marrow edema at the former. Mild degenerative retrolisthesis at those levels and L5-S1. 2. But no significant lumbar spinal or lateral recess stenosis. And up to moderate associated neural foraminal stenosis at those levels does not appear significantly changed from a 2023 MRI.Lumbar MRI pending  04/22/24 - DG LUMBAR SPINE - 2-3 VIEW CLINICAL DATA:  Lumbar radicular pain on the right   FINDINGS: There is no evidence of lumbar spine fracture. Degenerative changes of the spine predominantly involving the L3-L4 and, L4-L5 and L5-S1 with associated decreased intervertebral disc space and facet hypertrophy. Grade 1 anterolisthesis of L4-L5.   Soft tissues are unremarkable.   IMPRESSION: Degenerative disc disease involving L3- L4 through L5-S1.   Grade 1 anterolisthesis of L4-L5.  01/07/24 - MRI RIGHT SHOULDER WITHOUT CONTRAST, 01/07/2024 12:08 PM  INDICATION: Pain in right shoulder \ M25.511 Pain in right shoulder ,   FINDINGS:   . Acromioclavicular joint: Mild degenerative changes. No effusion or malalignment.  .  Acromion: No subacromial spurring. Mild subacromial subdeltoid bursitis.  .  Supraspinatus tendon:  Mild tendinosis.  .  Infraspinatus tendon: Mild tendinosis.  .  Subscapularis tendon: Intact.  .  Teres minor tendon: Intact.  .  Long head of biceps: Mild tendinosis above the bicipital groove.  .  Glenohumeral joint: No effusion or malalignment.  .  Labrum: Postsurgical changes related to anterior inferior labral repair/Bankart. Linear T2 signal within the posterior inferior labrum [series 3 image 14-15, series 7 image 16] favors to represent postsurgical changes rather than a labral tear, for correlation with operative notes.  .  Bones: Bony remodeling within the anterior-inferior glenoid without edema like signal, likely a sequelae of anterior shoulder dislocation [series 8 image 16, series 4 image 18]. Remote Hill-Sachs fracture. Normal marrow signal. No fracture, neoplasm, or avascular necrosis.  .  Additional comments: No muscle atrophy.   IMPRESSION:  1. Remodeling of the anterior inferior glenoid likely related to remote bony Bankart fracture and remote Hill-Sachs fracture, likely a sequelae of prior anterior shoulder dislocation.  2.  Postsurgical changes related to labral repair.  3.  Linear T2 signal within the posterior inferior labrum, favors to represent postsurgical changes, for correlation with operative notes.  4.  Mild tendinosis supraspinatus, infraspinatus and long head of biceps.  5.  Mild subacromial/subdeltoid bursitis.  6.  Mild AC joint degenerative changes.   04/07/22 - MRI LUMBAR SPINE WITHOUT CONTRAST  CLINICAL DATA:  Initial evaluation for lumbar degenerative disc disease.   FINDINGS:  Segmentation: Standard. Lowest well-formed disc space labeled the  L5-S1 level.   Alignment: 3 mm degenerative retrolisthesis of L4 on L5, with trace  2 mm retrolisthesis of L3 on L4. Alignment otherwise normal with  preservation of the normal lumbar lordosis.   Vertebrae: Vertebral body height maintained without acute or chronic  fracture. Bone marrow signal intensity within  normal limits. No  worrisome osseous lesions. Discogenic reactive endplate changes  present about the L3-4 and L4-5 interspaces. No other abnormal  marrow edema.   Conus medullaris and cauda equina: Conus extends to the L2 level.  Conus and cauda equina appear normal.   Paraspinal and other soft tissues: Unremarkable.   Disc levels:   L1-2:  Unremarkable.   L2-3:  Unremarkable.   L3-4: Degenerative intervertebral disc space narrowing with  circumferential disc bulge and disc desiccation. Associated reactive  endplate change with marginal endplate osteophytic spurring. Mild  facet hypertrophy. No significant spinal stenosis. Mild bilateral L3  foraminal narrowing. No frank impingement.   L4-5: Degenerative with abscesses narrowing with diffuse disc bulge  and disc desiccation. Associated reactive endplate spurring. Mild  facet and ligament flavum hypertrophy. No significant spinal  stenosis. Mild right with mild to moderate left L4 foraminal  stenosis.   L5-S1: Mild diffuse disc bulge with endplate spurring. Mild  bilateral facet hypertrophy. No spinal stenosis. Mild bilateral L5  foraminal narrowing. No frank impingement.   IMPRESSION:  1. Lower lumbar degenerative disc disease at L3-4 through L5-S1 with  resultant mild to moderate bilateral L3 through L5 foraminal  stenosis as above. No significant spinal stenosis or overt neural  impingement.  2. Mild bilateral facet hypertrophy at L3-4 through L5-S1.   PATIENT SURVEYS:  Oswestry Low Back Pain Disability Questionnaire:  MODIFIED OSWESTRY DISABILITY SCALE Date:  05/13/24   Pain intensity 5 =  Pain medication has no effect on my pain.  2. Personal care (washing, dressing, etc.) 3 =  I need help, but I am able to manage most of my personal care.  3. Lifting 2 = Pain prevents me from lifting heavy weights off the floor,but I can manage if the weights are conveniently positioned (e.g. on a table)  4. Walking 2 =  Pain  prevents me from walking more than  mile.  5. Sitting 3 =  Pain prevents me from sitting more than  hour.  6. Standing 3 =  Pain prevents me from standing more than 1/2 hour.  7. Sleeping 4 =  Even when I take pain medication, I sleep less than 2 hour  8. Social Life 2 = Pain prevents me from participating in more energetic activities (eg. sports, dancing).  9. Traveling 2 =  My pain restricts my travel over 2 hours.  10. Employment/ Homemaking 3 = Pain prevents me from doing anything but light duties.  Total 29/50  % Disability 58.0 % - Severe   Interpretation of scores: Score Category Description  0-20% Minimal Disability The patient can cope with most living activities. Usually no treatment is indicated apart from advice on lifting, sitting and exercise  21-40% Moderate Disability The patient experiences more pain and difficulty with sitting, lifting and standing. Travel and social life are more difficult and they may be disabled from work. Personal care, sexual activity and sleeping are not grossly affected, and the patient can usually be managed by conservative means  41-60% Severe Disability Pain remains the main problem in this group, but activities of daily living are affected. These patients require a detailed investigation  61-80% Crippled Back pain impinges on all aspects of the patient's life. Positive intervention is required  81-100% Bed-bound These patients are either bed-bound or exaggerating their symptoms  Bluford FORBES Zoe DELENA Karon DELENA, et al. Surgery versus conservative management of stable thoracolumbar fracture: the PRESTO feasibility RCT. [place] ([place]): [employer]; 2021 Nov. [facility] Technology Assessment, No. 25.62.) Appendix 3, Oswestry Disability Index category descriptors. Available from: Findjewelers.cz  Minimally Clinically Important Difference (MCID) = 12.8%    NDI:  NECK DISABILITY INDEX Date:  05/13/24   Pain  intensity 4 =  The pain is very severe at the moment  2. Personal care (washing, dressing, etc.) 3 =  I need some help but can manage most of my personal care  3. Lifting 3 = Pain prevents me from lifting heavy weights but I can manage light to medium   weights if they are conveniently positioned  4. Reading 4 =  I can hardly read at all because of severe pain in my neck  5. Headaches 3 = I have moderate headaches, which come frequently  6. Concentration 1 =  I can concentrate fully when I want to with slight difficulty   7. Work 3 =  I cannot do my usual work  8. Driving 3 = I can't drive my car as long as I want because of moderate pain in my neck  9. Sleeping 4 = My sleep is greatly disturbed (3-5 hrs sleepless)   10. Recreation 4 =  I can hardly do any recreation activities because of pain in my neck  Total 32/50  % Disability 64.0 % - Severe   Minimum Detectable Change (90% confidence): 5 points or 10% points  SCREENING FOR RED FLAGS: Bowel or bladder incontinence: No Spinal tumors: No Cauda equina syndrome: No Compression fracture: No Abdominal aneurysm: No  COGNITION: Overall cognitive status: Within functional limits for tasks assessed     SENSATION: WFL  POSTURE:  rounded shoulders, forward head, decreased lumbar lordosis, flexed trunk , and weight shift left  PALPATION: TTP with increased muscle tension and TPs in L UT and LS TTP in R lumbar paraspinals and glutes, R>L piriformis  CERVICAL ROM:   Active ROM Eval  Flexion 31  Extension 22 - tight  Right lateral flexion 23  Left lateral flexion 20 p!  Right rotation 36  Left rotation 28 p!    (Blank rows = not tested)  UPPER EXTREMITY ROM:  Active ROM Right eval Left eval  Shoulder flexion 120 149  Shoulder extension 34 36  Shoulder abduction 139 144  Shoulder adduction    Shoulder internal rotation L1 L2  Shoulder external rotation T3 T2    (Blank rows = not tested)  UPPER EXTREMITY MMT:  MMT Right  eval Left eval  Shoulder flexion 4+ 4  Shoulder extension 5 5  Shoulder abduction 4+ 4  Shoulder adduction    Shoulder internal rotation 5 5  Shoulder external rotation 4+ 4+  Middle trapezius 4 4  Lower trapezius 3- 3+  (Blank rows = not tested)  LUMBAR ROM:   Active  Eval  Flexion 90% limited  Extension 90% limited  Right lateral flexion Distal femur   Left lateral flexion Lateral femoral condyle   Right rotation 60% limited  Left rotation 60% limited    (Blank rows = not tested)  MUSCLE LENGTH: Hamstrings: mod tight B ITB: mod tight B Piriformis: mod/severe tight B Hip flexors: mod tight B Quads: mod tight B Heelcord:   LOWER EXTREMITY ROM:    Grossly WFL other than limitations due to tightness as above  LOWER EXTREMITY MMT:    MMT Right eval Left eval  Hip flexion 3 3+  Hip extension 2- 2-  Hip abduction 2 2+  Hip adduction 2 2  Hip internal rotation 4 3+  Hip external rotation 4- 4-  Knee flexion 4 4-  Knee extension 4 4  Ankle dorsiflexion 4+ 4  Ankle plantarflexion    Ankle inversion    Ankle eversion      (Blank rows =  not tested)  CERVICAL SPECIAL TESTS:  Spurling's test: Negative and Distraction test: Negative  LUMBAR SPECIAL TESTS:  Straight leg raise test: Negative and Slump test: Negative  FUNCTIONAL TESTS:  5 times sit to stand: unable w/o B UE assist; 27.56 sec with B UE assist   TODAY'S TREATMENT:   05/13/2024 - Eval SELF CARE:  Reviewed eval findings and role of PT in addressing identified deficits as well as instruction in initial HEP (see below).  Patient inquiring about use of estim which is not covered by his [employer], therefore provided information on a home TENS unit for self-purchase.   PATIENT EDUCATION:  Education details: PT eval findings, anticipated POC, initial HEP, and home TENS unit options as estim not covered by [employer]  Person educated: Patient Education method: Explanation, Demonstration,  Tactile cues, and MedBridgeGO app access provided Education comprehension: verbalized understanding, returned demonstration, verbal cues required, and needs further education  HOME EXERCISE PROGRAM: *Pt using MedBridgeGO app.  Access Code: [email or web address] URL: [email or web address] Date: 05/13/2024 Prepared by: Elijah Hidden  Exercises - Seated Gentle Upper Trapezius Stretch (Mirrored)  - 2-3 x daily - 7 x weekly - 3 reps - 30 sec hold - Gentle Levator Scapulae Stretch  - 2-3 x daily - 7 x weekly - 3 reps - 30 sec hold - Seated 3 Way Exercise Ball Roll Out Stretch  - 2-3 x daily - 7 x weekly - 10 reps - 5 sec hold - Supine Lower Trunk Rotation  - 2-3 x daily - 7 x weekly - 10 reps - 5-10 sec hold  Patient Education - TENS Therapy - TENS UNIT - AUVON Dual Channel TENS Unit   ASSESSMENT:  CLINICAL IMPRESSION: MONTAE STAGER is a 48 y.o. male who was referred to physical therapy for evaluation and treatment for acute L-sided neck pain and chronic R-sided lumbar pain with intermittent R LE radicular pain, numbness and tingling.  He also reports chronic R shoulder pain and limited ROM following surgery ~1 yr ago s/p chronic dislocations.   Patient reports onset of L-sided neck pain beginning ~1 month ago while brushing his teeth.  Neck pain is intermittent, worse with sleeping positions and turning his head.  Patient reports onset of R-sided low back pain 17+ years ago, exacerbated 2-3 yrs ago with a job requiring repetitive bending, lifting and twisting. Low back pain is chronic and constant, with electric shock down R LE to knee and ankle starting ~2 months ago, worse with prolonged sitting or standing.  Patient has deficits in cervical and lumber ROM, B LE flexibility, core/postural and B UE/LE strength, abnormal posture, and TTP with abnormal muscle tension which are interfering with ADLs and are impacting quality of life.  On NDI patient scored 32/50 demonstrating 64% or severe  disability.  On Modified Oswestry patient scored 29/50 demonstrating 58% or severe disability.  Marlow will benefit from skilled PT to address above deficits to improve mobility and activity tolerance with decreased pain interference.  OBJECTIVE IMPAIRMENTS: Abnormal gait, decreased activity tolerance, decreased endurance, decreased knowledge of condition, decreased mobility, difficulty walking, decreased ROM, decreased strength, hypomobility, increased fascial restrictions, impaired perceived functional ability, increased muscle spasms, impaired flexibility, impaired sensation, impaired UE functional use, improper body mechanics, postural dysfunction, and pain.   ACTIVITY LIMITATIONS: carrying, lifting, bending, sitting, standing, squatting, sleeping, stairs, transfers, bed mobility, bathing, dressing, hygiene/grooming, locomotion level, and caring for others  PARTICIPATION LIMITATIONS: cleaning, laundry, driving, shopping, community activity, occupation, and yard work  PERSONAL  FACTORS: Fitness, Past/current experiences, Time since onset of injury/illness/exacerbation, and 3+ comorbidities: R shoulder surgery 05/15/23, chronic R shoulder dislocation/subluxation, excision of neck tumor 04/02/23, DM, HTN, sarcoidosis, chronic LBP are also affecting patient's functional outcome.   REHAB POTENTIAL: Good  CLINICAL DECISION MAKING: Unstable/unpredictable  EVALUATION COMPLEXITY: High   GOALS: Goals reviewed with patient? Yes  SHORT TERM GOALS: Target date: 06/10/2024  Patient will be independent with initial HEP to improve outcomes and carryover.  Baseline: HEP initiated on eval Goal status: INITIAL  2.  Patient will report 25% improvement in neck and low back pain to improve QOL. Baseline: Neck = 5/10 & Low back = 7/10 on eval, both up to 10/10 at worst Goal status: INITIAL  3.  Patient will demonstrate improved posture to decrease muscle imbalance. Baseline: forward head, rounded  shoulders, decreased lumbar lordosis, flexed trunk, and weight shift left Goal status: INITIAL  LONG TERM GOALS: Target date: 07/08/2024  Patient will be independent with ongoing/advanced HEP for self-management at home.  Baseline:  Goal status: INITIAL  2.  Patient will report 50-75% improvement in neck and low back pain to improve QOL.  Baseline: Neck = 5/10 & Low back = 7/10 on eval, both up to 10/10 at worst Goal status: INITIAL  3.  Patient to demonstrate ability to achieve and maintain good spinal alignment and body mechanics needed for daily activities. Baseline:  Goal status: INITIAL  4.  Patient will demonstrate functional pain free cervical ROM for safety with driving.  Baseline: Refer to above cervical ROM table Goal status: INITIAL  5.  Patient will demonstrate functional pain free lumbar ROM to perform ADLs.   Baseline: Refer to above lumbar ROM table Goal status: INITIAL  7.  Patient will demonstrate improved functional strength as demonstrated by improved B postural and UE/LE strength to >/= 4 to 4+/5. Baseline: Refer to above UE/LE MMT tables Goal status: INITIAL  6.  Patient will report </= 54% on NDI (MCID = 10%) to demonstrate improved functional ability.  Baseline: 32 / 50 = 64.0 % Goal status: INITIAL   7.  Patient will report </= 46% on Modified Oswestry (MCID = 12%) to demonstrate improved functional ability.  Baseline: 29 / 50 = 58.0 % Goal status: INITIAL  8.  Patient to report ability to perform ADLs, household, and work-related tasks without need for assistance of his wife. Baseline: needs assistance of wife with washing his back and most household chores Goal status: INITIAL   9.  Patient will tolerate >30 min of sitting or standing w/o increase in pain or radicular symptoms to allow for increased ease of ADL performance. Baseline: Back pain prevents sitting or standing for >30 minutes Goal status: INITIAL  10.  Patient will report  centralization of R LE radicular symptoms.  Baseline: electric shock down R LE to knee and ankle Goal status: INITIAL   PLAN:  PT FREQUENCY: 2x/week  PT DURATION: 8 weeks  PLANNED INTERVENTIONS: [id number]- PT Re-evaluation, 97750- Physical Performance Testing, 97110-Therapeutic exercises, 97530- Therapeutic activity, [id number]- Neuromuscular re-education, 97535- Self Care, [id number]- Manual therapy, [phone]- Gait training, [phone]- Aquatic Therapy, [phone]- Ultrasound, [phone] (1-2 muscles), [id number] (3+ muscles)- Dry Needling, Patient/Family education, Balance training, Stair training, Taping, Joint mobilization, Spinal mobilization, Cryotherapy, and Moist heat  PLAN FOR NEXT SESSION: Review initial HEP, gently progress cervical and lumbar ROM/stretching, MT +/- TPDN to address TPs/abnormal muscle tension in L UT, lumbar paraspinals, glues and piriformis   Elijah CHRISTELLA Hidden, PT 05/13/2024, 10:14 AM

## 2024-05-19 ENCOUNTER — Ambulatory Visit: Admitting: Orthopaedic Surgery

## 2024-05-20 ENCOUNTER — Ambulatory Visit: Attending: Neurosurgery

## 2024-05-20 DIAGNOSIS — M6281 Muscle weakness (generalized): Secondary | ICD-10-CM | POA: Diagnosis present

## 2024-05-20 DIAGNOSIS — R2689 Other abnormalities of gait and mobility: Secondary | ICD-10-CM | POA: Insufficient documentation

## 2024-05-20 DIAGNOSIS — G8929 Other chronic pain: Secondary | ICD-10-CM | POA: Insufficient documentation

## 2024-05-20 DIAGNOSIS — M542 Cervicalgia: Secondary | ICD-10-CM | POA: Diagnosis present

## 2024-05-20 DIAGNOSIS — R293 Abnormal posture: Secondary | ICD-10-CM | POA: Insufficient documentation

## 2024-05-20 DIAGNOSIS — M5441 Lumbago with sciatica, right side: Secondary | ICD-10-CM | POA: Diagnosis present

## 2024-05-20 NOTE — Therapy (Addendum)
 OUTPATIENT PHYSICAL THERAPY CERVICAL AND THORACOLUMBAR TREATMENT   Patient Name: Brett Allen MRN: [id number] DOB:1975-11-15, 48 y.o., male Today's Date: 05/20/2024   END OF SESSION:  PT End of Session - 05/20/24 1105     Visit Number 2    Date for Recertification  07/08/24    Authorization Type [employer]    Authorization Time Period 05/13/24 - 08/11/24    Authorization - Number of Visits 14    PT Start Time 1020    PT Stop Time 1120    PT Time Calculation (min) 60 min    Activity Tolerance Patient limited by pain    Behavior During Therapy [facility] for tasks assessed/performed           Past Medical History:  Diagnosis Date   Back pain    lower   Cellulitis    Diabetes mellitus without complication (HCC)    Hypertension    Sarcoidosis    right eye   Past Surgical History:  Procedure Laterality Date   left wrsit surgery Left    WRIST FRACTURE SURGERY Left 2017   Patient Active Problem List   Diagnosis Date Noted   Cellulitis 10/27/2022   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   Chronic bilateral low back pain without sciatica 07/23/2015   Hyperlipidemia 07/09/2015    PCP: Dorina Loving, PA-C   REFERRING PROVIDER: Gillie Duncans, MD   REFERRING DIAG: M54.2 (ICD-10-CM) - Cervicalgia  Lumbar & Cervical pain  THERAPY DIAG:  Cervicalgia  Chronic right-sided low back pain with right-sided sciatica  Muscle weakness (generalized)  Abnormal posture  Other abnormalities of gait and mobility  RATIONALE FOR EVALUATION AND TREATMENT: Rehabilitation  ONSET DATE: 04/27/24 - Neck pain;  Chronic - Lumbar pain  NEXT MD VISIT: 05/19/24   SUBJECTIVE:                                                                                                                                                                                                         SUBJECTIVE STATEMENT: Pt is going through with lumbar surgery but waiting to scheduled  Pt reports  continued limitations in R shoulder since his surgery on 05/15/23.  He reports he reinjured his shoulder during work hardening/conditioning while preparing to return to work.  He states he has been given a 15% disability because of this which has kept him out of work.  His neck pain has been bad for about a 1 month, triggered as he went to brush his teeth one morning.  Pain comes and goes but throbs  all day long when present, limiting his ability to turn his head and chew.  Low back pain has been chronic for 17+ years (since moving to [place] in 2008).  Has h/o chiropractor work for his back - seen 3-4x in past month with ~3 visits remaining.  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  Feels like he is crooked and leaning forward when he walks.   PAIN: Are you having pain? Yes: NPRS scale: always 7/10, up to 10/10 at worst  Pain location: midline low back to R, electric shock down R LE to knee and ankle starting ~2 months ago (trigger by prolonged sitting and standing)  Pain description: stabbing, locks up at times  Aggravating factors: prolonged sitting or standing, rising from bed in the morning  Relieving factors: meds take the edge off, heat, chiropractor - nothing really lasts   Are you having pain? Yes: NPRS scale: 5/10, up to 10/10 at worst  Pain location: L lateral neck & upper shoulder  Pain description: throbbing on a normal day, sharp stabbing on bad day with massive headache  Aggravating factors: turning head to L, lying in awkward positions (esp on L side)   Relieving factors: heat, compression, meds (flexeril  at night, ibuprofen )   Are you having pain? Yes: NPRS scale: 5/10, up to 10/10 at worst  Pain location: R anterior shoulder  Pain description: stinging, crunching, occasional numbness, TTP, throbbing  Aggravating factors: end range ER, lifting, sports  Relieving  factors: ice, ibuprofen    PERTINENT HISTORY:  R shoulder surgery 05/15/23, chronic R shoulder dislocation/subluxation, excision of neck tumor 04/02/23, DM, HTN, sarcoidosis, chronic LBP    PRECAUTIONS: None  HAND DOMINANCE: Right  RED FLAGS: None  WEIGHT BEARING RESTRICTIONS: No  FALLS:  Has patient fallen in last 6 months? No  LIVING ENVIRONMENT: Lives with: lives with their family Lives in: House Stairs: Yes: Internal: 10-12 steps; on right going up and External: 1 steps; none Has following equipment at home: None  OCCUPATION: Unemployed  PLOF: Independent with gait, Independent with transfers, Needs assistance with ADLs, and Leisure: walking; play video games, play basketball - currently unable  PATIENT GOALS: Some type of relief of my pain.    OBJECTIVE:   DIAGNOSTIC FINDINGS:  Neck x-ray unavailable  05/06/24 - MRI LUMBAR SPINE CLINICAL HISTORY: 48 year old male with spondylolisthesis, lumbar region.   FINDINGS:   BONES AND ALIGNMENT: Normal lumbar segmentation on the comparison radiographs. Stable lordosis since 2023. Mild degenerative retrolisthesis from L3-L4 through L5-S1. Normal background bone marrow signal. Chronic degenerative endplate marrow signal changes at L3-L4 and L4-L5, with superimposed degenerative appearing left lateral endplate marrow edema (left greater than right) at L3-L4 (series 6 image 11). No associated paraspinal soft tissue inflammation. No other marrow edema.   SPINAL CORD: Normal conus medullaris at L1-L2. No signal abnormality in the visible lower thoracic spinal cord or conus.   SOFT TISSUES: No paraspinal mass.   DEGENERATIVE:   Visible lower thoracic spine through L2-L3 are negative. L3-L4: Chronic severe disc space loss. Circumferential disc osteophyte complex. Degenerative appearing endplate marrow edema. Mild to moderate facet and ligamentum flavum hypertrophy. No spinal or lateral recess stenosis. Mild  to moderate bilateral L3 neural foraminal stenosis appears stable. L4-L5: Similar advanced chronic disc degeneration and disc space loss. Circumferential disc bulge with mild endplate spurring. Mild posterior element hypertrophy. No spinal or lateral recess stenosis. Moderate left  and mild right L4 neural foraminal stenosis (series 5 image 11 on the left). This level appears stable. L5-S1: Mild chronic retrolisthesis with better preserved disc height and disc signal. Mild circumferential disc bulge. Mild to moderate facet hypertrophy. No spinal or lateral recess stenosis. Mild to moderate bilateral L5 neural foraminal stenosis. This level is stable.   IMPRESSION: 1. Very age-advanced lumbar disc and endplate degeneration at L3-L4 and L4-L5, with superimposed mild degenerative-appearing endplate marrow edema at the former. Mild degenerative retrolisthesis at those levels and L5-S1. 2. But no significant lumbar spinal or lateral recess stenosis. And up to moderate associated neural foraminal stenosis at those levels does not appear significantly changed from a 2023 MRI.Lumbar MRI pending  04/22/24 - DG LUMBAR SPINE - 2-3 VIEW CLINICAL DATA:  Lumbar radicular pain on the right   FINDINGS: There is no evidence of lumbar spine fracture. Degenerative changes of the spine predominantly involving the L3-L4 and, L4-L5 and L5-S1 with associated decreased intervertebral disc space and facet hypertrophy. Grade 1 anterolisthesis of L4-L5.   Soft tissues are unremarkable.   IMPRESSION: Degenerative disc disease involving L3- L4 through L5-S1.   Grade 1 anterolisthesis of L4-L5.  01/07/24 - MRI RIGHT SHOULDER WITHOUT CONTRAST, 01/07/2024 12:08 PM  INDICATION: Pain in right shoulder \ M25.511 Pain in right shoulder ,   FINDINGS:   . Acromioclavicular joint: Mild degenerative changes. No effusion or malalignment.  .  Acromion: No subacromial spurring. Mild subacromial subdeltoid bursitis.  .   Supraspinatus tendon: Mild tendinosis.  .  Infraspinatus tendon: Mild tendinosis.  .  Subscapularis tendon: Intact.  .  Teres minor tendon: Intact.  .  Long head of biceps: Mild tendinosis above the bicipital groove.  .  Glenohumeral joint: No effusion or malalignment.  .  Labrum: Postsurgical changes related to anterior inferior labral repair/Bankart. Linear T2 signal within the posterior inferior labrum [series 3 image 14-15, series 7 image 16] favors to represent postsurgical changes rather than a labral tear, for correlation with operative notes.  .  Bones: Bony remodeling within the anterior-inferior glenoid without edema like signal, likely a sequelae of anterior shoulder dislocation [series 8 image 16, series 4 image 18]. Remote Hill-Sachs fracture. Normal marrow signal. No fracture, neoplasm, or avascular necrosis.  .  Additional comments: No muscle atrophy.   IMPRESSION:  1. Remodeling of the anterior inferior glenoid likely related to remote bony Bankart fracture and remote Hill-Sachs fracture, likely a sequelae of prior anterior shoulder dislocation.  2.  Postsurgical changes related to labral repair.  3.  Linear T2 signal within the posterior inferior labrum, favors to represent postsurgical changes, for correlation with operative notes.  4.  Mild tendinosis supraspinatus, infraspinatus and long head of biceps.  5.  Mild subacromial/subdeltoid bursitis.  6.  Mild AC joint degenerative changes.   04/07/22 - MRI LUMBAR SPINE WITHOUT CONTRAST  CLINICAL DATA:  Initial evaluation for lumbar degenerative disc disease.   FINDINGS:  Segmentation: Standard. Lowest well-formed disc space labeled the  L5-S1 level.   Alignment: 3 mm degenerative retrolisthesis of L4 on L5, with trace  2 mm retrolisthesis of L3 on L4. Alignment otherwise normal with  preservation of the normal lumbar lordosis.   Vertebrae: Vertebral body height maintained without acute or chronic  fracture. Bone marrow  signal intensity within normal limits. No  worrisome osseous lesions. Discogenic reactive endplate changes  present about the L3-4 and L4-5 interspaces. No other abnormal  marrow edema.   Conus medullaris and cauda equina: Conus extends  to the L2 level.  Conus and cauda equina appear normal.   Paraspinal and other soft tissues: Unremarkable.   Disc levels:   L1-2:  Unremarkable.   L2-3:  Unremarkable.   L3-4: Degenerative intervertebral disc space narrowing with  circumferential disc bulge and disc desiccation. Associated reactive  endplate change with marginal endplate osteophytic spurring. Mild  facet hypertrophy. No significant spinal stenosis. Mild bilateral L3  foraminal narrowing. No frank impingement.   L4-5: Degenerative with abscesses narrowing with diffuse disc bulge  and disc desiccation. Associated reactive endplate spurring. Mild  facet and ligament flavum hypertrophy. No significant spinal  stenosis. Mild right with mild to moderate left L4 foraminal  stenosis.   L5-S1: Mild diffuse disc bulge with endplate spurring. Mild  bilateral facet hypertrophy. No spinal stenosis. Mild bilateral L5  foraminal narrowing. No frank impingement.   IMPRESSION:  1. Lower lumbar degenerative disc disease at L3-4 through L5-S1 with  resultant mild to moderate bilateral L3 through L5 foraminal  stenosis as above. No significant spinal stenosis or overt neural  impingement.  2. Mild bilateral facet hypertrophy at L3-4 through L5-S1.   PATIENT SURVEYS:  Oswestry Low Back Pain Disability Questionnaire:  MODIFIED OSWESTRY DISABILITY SCALE Date:  05/13/24   Pain intensity 5 =  Pain medication has no effect on my pain.  2. Personal care (washing, dressing, etc.) 3 =  I need help, but I am able to manage most of my personal care.  3. Lifting 2 = Pain prevents me from lifting heavy weights off the floor,but I can manage if the weights are conveniently positioned (e.g. on a table)  4.  Walking 2 =  Pain prevents me from walking more than  mile.  5. Sitting 3 =  Pain prevents me from sitting more than  hour.  6. Standing 3 =  Pain prevents me from standing more than 1/2 hour.  7. Sleeping 4 =  Even when I take pain medication, I sleep less than 2 hour  8. Social Life 2 = Pain prevents me from participating in more energetic activities (eg. sports, dancing).  9. Traveling 2 =  My pain restricts my travel over 2 hours.  10. Employment/ Homemaking 3 = Pain prevents me from doing anything but light duties.  Total 29/50  % Disability 58.0 % - Severe   Interpretation of scores: Score Category Description  0-20% Minimal Disability The patient can cope with most living activities. Usually no treatment is indicated apart from advice on lifting, sitting and exercise  21-40% Moderate Disability The patient experiences more pain and difficulty with sitting, lifting and standing. Travel and social life are more difficult and they may be disabled from work. Personal care, sexual activity and sleeping are not grossly affected, and the patient can usually be managed by conservative means  41-60% Severe Disability Pain remains the main problem in this group, but activities of daily living are affected. These patients require a detailed investigation  61-80% Crippled Back pain impinges on all aspects of the patient's life. Positive intervention is required  81-100% Bed-bound These patients are either bed-bound or exaggerating their symptoms  Bluford FORBES Zoe DELENA Karon DELENA, et al. Surgery versus conservative management of stable thoracolumbar fracture: the PRESTO feasibility RCT. [place] ([place]): [employer]; 2021 Nov. [facility] Technology Assessment, No. 25.62.) Appendix 3, Oswestry Disability Index category descriptors. Available from: [email or web address].cz  Minimally Clinically Important Difference (MCID) = 12.8%    NDI:  NECK DISABILITY INDEX Date:   05/13/24  Pain intensity 4 = The pain is very severe at the moment  2. Personal care (washing, dressing, etc.) 3 =  I need some help but can manage most of my personal care  3. Lifting 3 = Pain prevents me from lifting heavy weights but I can manage light to medium   weights if they are conveniently positioned  4. Reading 4 =  I can hardly read at all because of severe pain in my neck  5. Headaches 3 = I have moderate headaches, which come frequently  6. Concentration 1 =  I can concentrate fully when I want to with slight difficulty   7. Work 3 =  I cannot do my usual work  8. Driving 3 = I can't drive my car as long as I want because of moderate pain in my neck  9. Sleeping 4 = My sleep is greatly disturbed (3-5 hrs sleepless)   10. Recreation 4 =  I can hardly do any recreation activities because of pain in my neck  Total 32/50  % Disability 64.0 % - Severe   Minimum Detectable Change (90% confidence): 5 points or 10% points  SCREENING FOR RED FLAGS: Bowel or bladder incontinence: No Spinal tumors: No Cauda equina syndrome: No Compression fracture: No Abdominal aneurysm: No  COGNITION: Overall cognitive status: Within functional limits for tasks assessed     SENSATION: WFL  POSTURE:  rounded shoulders, forward head, decreased lumbar lordosis, flexed trunk , and weight shift left  PALPATION: TTP with increased muscle tension and TPs in L UT and LS TTP in R lumbar paraspinals and glutes, R>L piriformis  CERVICAL ROM:   Active ROM Eval  Flexion 31  Extension 22 - tight  Right lateral flexion 23  Left lateral flexion 20 p!  Right rotation 36  Left rotation 28 p!    (Blank rows = not tested)  UPPER EXTREMITY ROM:  Active ROM Right eval Left eval  Shoulder flexion 120 149  Shoulder extension 34 36  Shoulder abduction 139 144  Shoulder adduction    Shoulder internal rotation L1 L2  Shoulder external rotation T3 T2    (Blank rows = not tested)  UPPER EXTREMITY  MMT:  MMT Right eval Left eval  Shoulder flexion 4+ 4  Shoulder extension 5 5  Shoulder abduction 4+ 4  Shoulder adduction    Shoulder internal rotation 5 5  Shoulder external rotation 4+ 4+  Middle trapezius 4 4  Lower trapezius 3- 3+  (Blank rows = not tested)  LUMBAR ROM:   Active  Eval  Flexion 90% limited  Extension 90% limited  Right lateral flexion Distal femur   Left lateral flexion Lateral femoral condyle   Right rotation 60% limited  Left rotation 60% limited    (Blank rows = not tested)  MUSCLE LENGTH: Hamstrings: mod tight B ITB: mod tight B Piriformis: mod/severe tight B Hip flexors: mod tight B Quads: mod tight B Heelcord:   LOWER EXTREMITY ROM:    Grossly WFL other than limitations due to tightness as above  LOWER EXTREMITY MMT:    MMT Right eval Left eval  Hip flexion 3 3+  Hip extension 2- 2-  Hip abduction 2 2+  Hip adduction 2 2  Hip internal rotation 4 3+  Hip external rotation 4- 4-  Knee flexion 4 4-  Knee extension 4 4  Ankle dorsiflexion 4+ 4  Ankle plantarflexion    Ankle inversion    Ankle eversion      (  Blank rows = not tested)  CERVICAL SPECIAL TESTS:  Spurling's test: Negative and Distraction test: Negative  LUMBAR SPECIAL TESTS:  Straight leg raise test: Negative and Slump test: Negative  FUNCTIONAL TESTS:  5 times sit to stand: unable w/o B UE assist; 27.56 sec with B UE assist   TODAY'S TREATMENT:  05/20/24 THERAPEUTIC EXERCISE: To improve strength, endurance, ROM, and flexibility.  UBE L2.0 3 min fwd/ 3 min back Bike L2x39min Seated UT and levator stretches reviewed Seated lumbar flexion stretch touching floor 2x30' Supine LTR both ways 10x5' Supine SKTC R/L 2 x 30' Supine HS stretch 2x30' BLE w/ strap Seated ab sets orange pball 10x5'  05/13/2024 - Eval SELF CARE:  Reviewed eval findings and role of PT in addressing identified deficits as well as instruction in initial HEP (see below).  Patient inquiring about  use of estim which is not covered by his [employer], therefore provided information on a home TENS unit for self-purchase.   PATIENT EDUCATION:  Education details: PT eval findings, anticipated POC, initial HEP, and home TENS unit options as estim not covered by [employer]  Person educated: Patient Education method: Explanation, Demonstration, Tactile cues, and MedBridgeGO app access provided Education comprehension: verbalized understanding, returned demonstration, verbal cues required, and needs further education  HOME EXERCISE PROGRAM: *Pt using MedBridgeGO app.  Access Code: [email or web address] URL: [email or web address] Date: 05/13/2024 Prepared by: Elijah Hidden  Exercises - Seated Gentle Upper Trapezius Stretch (Mirrored)  - 2-3 x daily - 7 x weekly - 3 reps - 30 sec hold - Gentle Levator Scapulae Stretch  - 2-3 x daily - 7 x weekly - 3 reps - 30 sec hold - Seated 3 Way Exercise Ball Roll Out Stretch  - 2-3 x daily - 7 x weekly - 10 reps - 5 sec hold - Supine Lower Trunk Rotation  - 2-3 x daily - 7 x weekly - 10 reps - 5-10 sec hold  Patient Education - TENS Therapy - TENS UNIT - AUVON Dual Channel TENS Unit   ASSESSMENT:  CLINICAL IMPRESSION: Pt very guarded today with movement, mostly in lower back. Mostly did gentle stretches for lumbar spine with ok response. He definitely shows a flexion preference of movement. Concluded with moist heat for pain.    RYON LAYTON is a 48 y.o. male who was referred to physical therapy for evaluation and treatment for acute L-sided neck pain and chronic R-sided lumbar pain with intermittent R LE radicular pain, numbness and tingling.  He also reports chronic R shoulder pain and limited ROM following surgery ~1 yr ago s/p chronic dislocations.   Patient reports onset of L-sided neck pain beginning ~1 month ago while brushing his teeth.  Neck pain is intermittent, worse with sleeping positions and turning his head.   Patient reports onset of R-sided low back pain 17+ years ago, exacerbated 2-3 yrs ago with a job requiring repetitive bending, lifting and twisting. Low back pain is chronic and constant, with electric shock down R LE to knee and ankle starting ~2 months ago, worse with prolonged sitting or standing.  Patient has deficits in cervical and lumber ROM, B LE flexibility, core/postural and B UE/LE strength, abnormal posture, and TTP with abnormal muscle tension which are interfering with ADLs and are impacting quality of life.  On NDI patient scored 32/50 demonstrating 64% or severe disability.  On Modified Oswestry patient scored 29/50 demonstrating 58% or severe disability.  Lucile will benefit from skilled PT to address above  deficits to improve mobility and activity tolerance with decreased pain interference.  OBJECTIVE IMPAIRMENTS: Abnormal gait, decreased activity tolerance, decreased endurance, decreased knowledge of condition, decreased mobility, difficulty walking, decreased ROM, decreased strength, hypomobility, increased fascial restrictions, impaired perceived functional ability, increased muscle spasms, impaired flexibility, impaired sensation, impaired UE functional use, improper body mechanics, postural dysfunction, and pain.   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   REHAB POTENTIAL: Good  CLINICAL DECISION MAKING:  Unstable/unpredictable  EVALUATION COMPLEXITY: High   GOALS: Goals reviewed with patient? Yes  SHORT TERM GOALS: Target date: 06/10/2024  Patient will be independent with initial HEP to improve outcomes and carryover.  Baseline: HEP initiated on eval Goal status: INITIAL  2.  Patient will report 25% improvement in neck and low back pain to improve QOL. Baseline: Neck = 5/10 & Low back = 7/10 on eval, both up to 10/10 at worst Goal status: INITIAL  3.  Patient will demonstrate improved posture to decrease muscle imbalance. Baseline: forward head, rounded shoulders, decreased lumbar lordosis, flexed trunk, and weight shift left Goal status: INITIAL  LONG TERM GOALS: Target date: 07/08/2024  Patient will be independent with ongoing/advanced HEP for self-management at home.  Baseline:  Goal status: INITIAL  2.  Patient will report 50-75% improvement in neck and low back pain to improve QOL.  Baseline: Neck = 5/10 & Low back = 7/10 on eval, both up to 10/10 at worst Goal status: INITIAL  3.  Patient to demonstrate ability to achieve and maintain good spinal alignment and body mechanics needed for daily activities. Baseline:  Goal status: INITIAL  4.  Patient will demonstrate functional pain free cervical ROM for safety with driving.  Baseline: Refer to above cervical ROM table Goal status: INITIAL  5.  Patient will demonstrate functional pain free lumbar ROM to perform ADLs.   Baseline: Refer to above lumbar ROM table Goal status: INITIAL  7.  Patient will demonstrate improved functional strength as demonstrated by improved B postural and UE/LE strength to >/= 4 to 4+/5. Baseline: Refer to above UE/LE MMT tables Goal status: INITIAL  6.  Patient will report </= 54% on NDI (MCID = 10%) to demonstrate improved functional ability.  Baseline: 32 / 50 = 64.0 % Goal status: INITIAL   7.  Patient will report </= 46% on Modified Oswestry (MCID = 12%) to demonstrate improved  functional ability.  Baseline: 29 / 50 = 58.0 % Goal status: INITIAL  8.  Patient to report ability to perform ADLs, household, and work-related tasks without need for assistance of his wife. Baseline: needs assistance of wife with washing his back and most household chores Goal status: INITIAL   9.  Patient will tolerate >30 min of sitting or standing w/o increase in pain or radicular symptoms to allow for increased ease of ADL performance. Baseline: Back pain prevents sitting or standing for >30 minutes Goal status: INITIAL  10.  Patient will report centralization of R LE radicular symptoms.  Baseline: electric shock down R LE to knee and ankle Goal status: INITIAL   PLAN:  PT FREQUENCY: 2x/week  PT  DURATION: 8 weeks  PLANNED INTERVENTIONS: [id number]- PT Re-evaluation, 97750- Physical Performance Testing, 97110-Therapeutic exercises, 97530- Therapeutic activity, [id number]- Neuromuscular re-education, 97535- Self Care, [id number]- Manual therapy, [phone]- Gait training, [phone]- Aquatic Therapy, [phone]- Ultrasound, [phone] (1-2 muscles), 20561 (3+ muscles)- Dry Needling, Patient/Family education, Balance training, Stair training, Taping, Joint mobilization, Spinal mobilization, Cryotherapy, and Moist heat  PLAN FOR NEXT SESSION: Review initial HEP, gently progress cervical and lumbar ROM/stretching, MT +/- TPDN to address TPs/abnormal muscle tension in L UT, lumbar paraspinals, glues and piriformis   Lillis Nuttle L Cynthya Yam, PTA 05/20/2024, 11:06 AM

## 2024-05-21 ENCOUNTER — Other Ambulatory Visit (HOSPITAL_BASED_OUTPATIENT_CLINIC_OR_DEPARTMENT_OTHER): Payer: Self-pay

## 2024-05-21 ENCOUNTER — Ambulatory Visit: Admitting: Medical

## 2024-05-21 ENCOUNTER — Ambulatory Visit: Payer: Self-pay | Admitting: Medical

## 2024-05-21 VITALS — BP 122/82 | HR 77 | Temp 98.0°F | Resp 16 | Ht 70.0 in | Wt 208.4 lb

## 2024-05-21 DIAGNOSIS — M5416 Radiculopathy, lumbar region: Secondary | ICD-10-CM

## 2024-05-21 DIAGNOSIS — E119 Type 2 diabetes mellitus without complications: Secondary | ICD-10-CM

## 2024-05-21 DIAGNOSIS — I10 Essential (primary) hypertension: Secondary | ICD-10-CM

## 2024-05-21 DIAGNOSIS — E876 Hypokalemia: Secondary | ICD-10-CM

## 2024-05-21 DIAGNOSIS — L910 Hypertrophic scar: Secondary | ICD-10-CM

## 2024-05-21 DIAGNOSIS — M5126 Other intervertebral disc displacement, lumbar region: Secondary | ICD-10-CM

## 2024-05-21 LAB — COMPREHENSIVE METABOLIC PANEL WITH GFR
ALT: 23 U/L (ref 0–53)
AST: 21 U/L (ref 0–37)
Albumin: 4.9 g/dL (ref 3.5–5.2)
Alkaline Phosphatase: 87 U/L (ref 39–117)
BUN: 16 mg/dL (ref 6–23)
CO2: 29 meq/L (ref 19–32)
Calcium: 9.9 mg/dL (ref 8.4–10.5)
Chloride: 101 meq/L (ref 96–112)
Creatinine, Ser: 1.21 mg/dL (ref 0.40–1.50)
GFR: 72.06 mL/min (ref >60.00)
Glucose, Bld: 86 mg/dL (ref 70–99)
Potassium: 3.4 meq/L — ABNORMAL LOW (ref 3.5–5.1)
Sodium: 141 meq/L (ref 135–145)
Total Bilirubin: 0.5 mg/dL (ref 0.2–1.2)
Total Protein: 7.9 g/dL (ref 6.0–8.3)

## 2024-05-21 MED ORDER — TRAMADOL HCL 50 MG PO TABS
50.0000 mg | ORAL_TABLET | Freq: Every evening | ORAL | 0 refills | Status: DC | PRN
  Filled 2024-05-21: qty 30, 30d supply, fill #0

## 2024-05-21 MED ORDER — POTASSIUM CHLORIDE CRYS ER 10 MEQ PO TBCR
10.0000 meq | EXTENDED_RELEASE_TABLET | Freq: Every day | ORAL | 0 refills | Status: DC
  Filled 2024-05-21: qty 30, 30d supply, fill #0

## 2024-05-21 MED ORDER — IBUPROFEN 800 MG PO TABS
800.0000 mg | ORAL_TABLET | Freq: Three times a day (TID) | ORAL | 1 refills | Status: AC | PRN
  Filled 2024-05-21: qty 30, 10d supply, fill #0
  Filled 2024-07-09: qty 30, 10d supply, fill #1

## 2024-05-21 MED ORDER — OZEMPIC (0.25 OR 0.5 MG/DOSE) 2 MG/3ML ~~LOC~~ SOPN
0.2500 mg | PEN_INJECTOR | SUBCUTANEOUS | 0 refills | Status: DC
  Filled 2024-05-21 – 2024-05-30 (×2): qty 3, 56d supply, fill #0
  Filled ????-??-??: fill #0

## 2024-05-21 NOTE — Progress Notes (Signed)
   Subjective:    Patient ID: Brett Allen, male    DOB: 1976-05-22, 48 y.o.   MRN: [id number]  HPI  Brett Allen is a 48 year old male with lumbar disc degeneration who presents for management of chronic back pain and medication refills.  He reports worsening chronic low back pain from lumbar disc degeneration at L3-L4 and L4-L5 with L5-S1 retrolisthesis. Pain radiates down both legs.  He also has chroinc neck and  shoulder pain. Pain is significant enough that he want to try cane to help him get up from seated positon.(Discussed this verses walker but he prefers to try cane. He is requesting a prescription cane. He is awaiting scheduling for back surgery.  He has been using ibuprofen  800 mg once daily and tramadol  once daily. He has run out of tramadol  and needs refill.  He is on Ozempic  0.25 mg weekly for diabetes. A1c is 6.2, improved from 7.4 five months ago. Blood pressure is well controlled on valsartan  320 mg and amlodipine  10 mg.  He has painful, itchy keloids on the back of his neck for about a year. They become inflamed and red after haircuts. Topical ointments have not helped. He is requesting a dermatology referral.  His potassium was recently low at 3.3 and he completed a seven-day course of potassium tablets. He is not taking potassium-wasting medications and avoids bananas due to texture intolerance.     Review of Systems See hpi    Objective:   Physical Exam  General- No acute distress. Pleasant patient. Neck- Full range of motion, no jvd Lungs- Clear, even and unlabored. Heart- regular rate and rhythm. Neurologic- CNII- XII grossly intact.  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 Previous conservative treatments insufficient.  Surgery planned. - Prescribed cane for mobility assistance. - Refilled tramadol  for pain management. - Refilled ibuprofen  800 mg daily with kidney protection advice. - Advised follow-up with surgeon for post-operative pain management.  Type 2 diabetes mellitus Well-controlled with A1c of 6.2. Current treatment with Ozempic  effective. - Continue Ozempic  0.25 mg weekly.  Essential hypertension Well-controlled with valsartan  and Norvasc . Blood pressure within target range. - Continue current antihypertensive regimen.  Keloid scar, posterior neck Keloid scars causing pain and itching. Previous topical treatments ineffective. - Referred to dermatologist for further evaluation and management.  Hypokalemia Recent potassium level of 3.3. Previous supplementation incomplete. No medications causing depletion. - Ordered repeat potassium level. - Advised continuation of potassium supplementation based on lab results.  Follow up date to be determined after lab review and after you update me on surgery date.    Sharen Youngren, PA-C

## 2024-05-21 NOTE — Patient Instructions (Signed)
 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 Previous conservative treatments insufficient. Surgery planned. - Prescribed cane for mobility assistance. - Refilled tramadol  for pain management. - Refilled ibuprofen  800 mg daily with kidney protection advice. - Advised follow-up with surgeon for post-operative pain management.  Type 2 diabetes mellitus Well-controlled with A1c of 6.2. Current treatment with Ozempic  effective. - Continue Ozempic  0.25 mg weekly.  Essential hypertension Well-controlled with valsartan  and Norvasc . Blood pressure within target range. - Continue current antihypertensive regimen.  Keloid scar, posterior neck Keloid scars causing pain and itching. Previous topical treatments ineffective. - Referred to dermatologist for further evaluation and management.  Hypokalemia Recent potassium level of 3.3. Previous supplementation incomplete. No medications causing depletion. - Ordered repeat potassium level. - Advised continuation of potassium supplementation based on lab results.  Follow up date to be determined after lab review and after you update me on surgery date.

## 2024-05-21 NOTE — Addendum Note (Signed)
 Addended by: DORINA DALLAS HERO on: 05/21/2024 08:51 PM   Modules accepted: Orders

## 2024-05-21 NOTE — Addendum Note (Signed)
 Addended by: DORINA DALLAS HERO on: 05/21/2024 08:53 PM   Modules accepted: Orders

## 2024-05-22 ENCOUNTER — Other Ambulatory Visit: Payer: Self-pay | Admitting: Neurosurgery

## 2024-05-22 ENCOUNTER — Ambulatory Visit

## 2024-05-22 ENCOUNTER — Other Ambulatory Visit (HOSPITAL_BASED_OUTPATIENT_CLINIC_OR_DEPARTMENT_OTHER): Payer: Self-pay

## 2024-05-27 ENCOUNTER — Ambulatory Visit: Admitting: Physical Therapy

## 2024-05-27 ENCOUNTER — Encounter: Payer: Self-pay | Admitting: Physical Therapy

## 2024-05-27 DIAGNOSIS — R293 Abnormal posture: Secondary | ICD-10-CM

## 2024-05-27 DIAGNOSIS — G8929 Other chronic pain: Secondary | ICD-10-CM

## 2024-05-27 DIAGNOSIS — M6281 Muscle weakness (generalized): Secondary | ICD-10-CM

## 2024-05-27 DIAGNOSIS — M542 Cervicalgia: Secondary | ICD-10-CM

## 2024-05-27 DIAGNOSIS — R2689 Other abnormalities of gait and mobility: Secondary | ICD-10-CM

## 2024-05-27 NOTE — Therapy (Signed)
 OUTPATIENT PHYSICAL THERAPY TREATMENT   Patient Name: Brett Allen MRN: [id number] DOB:[birth year], 48 y.o., male Today's Date: 05/27/2024   END OF SESSION:  PT End of Session - 05/27/24 1058     Visit Number 3    Date for Recertification  07/08/24    Authorization Type [employer]    Authorization Time Period 05/13/24 - 08/11/24    Authorization - Visit Number 3    Authorization - Number of Visits 14    PT Start Time 1058    PT Stop Time 1143    PT Time Calculation (min) 45 min    Activity Tolerance Patient limited by pain    Behavior During Therapy [facility] for tasks assessed/performed            Past Medical History:  Diagnosis Date   Back pain    lower   Cellulitis    Diabetes mellitus without complication (HCC)    Hypertension    Sarcoidosis    right eye   Past Surgical History:  Procedure Laterality Date   left wrsit surgery Left    WRIST FRACTURE SURGERY Left 2017   Patient Active Problem List   Diagnosis Date Noted   Cellulitis 10/27/2022   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   Chronic bilateral low back pain without sciatica 07/23/2015   Hyperlipidemia 07/09/2015    PCP: Dorina Loving, PA-C   REFERRING PROVIDER: Gillie Duncans, MD   REFERRING DIAG: M54.2 (ICD-10-CM) - Cervicalgia  Lumbar & Cervical pain  THERAPY DIAG:  Cervicalgia  Chronic right-sided low back pain with right-sided sciatica  Muscle weakness (generalized)  Abnormal posture  Other abnormalities of gait and mobility  RATIONALE FOR EVALUATION AND TREATMENT: Rehabilitation  ONSET DATE: 04/27/24 - Neck pain;  Chronic - Lumbar pain  NEXT MD VISIT: 05/19/24   SUBJECTIVE:                                                                                                                                                                                                         SUBJECTIVE STATEMENT: Pt reports his lumbar fusion surgery is scheduled for 07/02/2024.    EVAL: Pt reports continued limitations in R shoulder since his surgery on 05/15/23.  He reports he reinjured his shoulder during work hardening/conditioning while preparing to return to work.  He states he has been given a 15% disability because of this which has kept him out of work.  His neck pain has been bad for about a 1 month, triggered as he went to brush his teeth one morning.  Pain comes and goes but throbs all day long when present, limiting his ability to turn his head and chew.  Low back pain has been chronic for 17+ years (since moving to [place] in 2008).  Has h/o chiropractor work for his back - seen 3-4x in past month with ~3 visits remaining.  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  Feels like he is crooked and leaning forward when he walks.   PAIN: Are you having pain? Yes: NPRS scale: always 7/10  Pain location: midline low back to R, electric shock down R LE to knee and ankle starting ~2 months ago (trigger by prolonged sitting and standing)  Pain description: stabbing, locks up at times  Aggravating factors: prolonged sitting or standing, rising from bed in the morning  Relieving factors: meds take the edge off, heat, chiropractor - nothing really lasts   Are you having pain? Yes: NPRS scale: 5/10, up to 10/10 at worst  Pain location: L lateral neck & upper shoulder  Pain description: throbbing on a normal day, sharp stabbing on bad day with massive headache  Aggravating factors: turning head to L, lying in awkward positions (esp on L side)   Relieving factors: heat, compression, meds (flexeril  at night, ibuprofen )   Are you having pain? Yes: NPRS scale: 5/10, up to 10/10 at worst  Pain location: R anterior shoulder  Pain description: stinging, crunching, occasional numbness, TTP, throbbing  Aggravating factors: end range ER, lifting, sports  Relieving factors:  ice, ibuprofen    PERTINENT HISTORY:  R shoulder surgery 05/15/23, chronic R shoulder dislocation/subluxation, excision of neck tumor 04/02/23, DM, HTN, sarcoidosis, chronic LBP    Lumbar fusion surgery scheduled for 07/02/2024  PRECAUTIONS: None  HAND DOMINANCE: Right  RED FLAGS: None  WEIGHT BEARING RESTRICTIONS: No  FALLS:  Has patient fallen in last 6 months? No  LIVING ENVIRONMENT: Lives with: lives with their family Lives in: House Stairs: Yes: Internal: 10-12 steps; on right going up and External: 1 steps; none Has following equipment at home: None  OCCUPATION: Unemployed  PLOF: Independent with gait, Independent with transfers, Needs assistance with ADLs, and Leisure: walking; play video games, play basketball - currently unable  PATIENT GOALS: Some type of relief of my pain.    OBJECTIVE:   DIAGNOSTIC FINDINGS:  Neck x-ray unavailable  05/06/24 - MRI LUMBAR SPINE CLINICAL HISTORY: 48 year old male with spondylolisthesis, lumbar region.   FINDINGS:   BONES AND ALIGNMENT: Normal lumbar segmentation on the comparison radiographs. Stable lordosis since 2023. Mild degenerative retrolisthesis from L3-L4 through L5-S1. Normal background bone marrow signal. Chronic degenerative endplate marrow signal changes at L3-L4 and L4-L5, with superimposed degenerative appearing left lateral endplate marrow edema (left greater than right) at L3-L4 (series 6 image 11). No associated paraspinal soft tissue inflammation. No other marrow edema.   SPINAL CORD: Normal conus medullaris at L1-L2. No signal abnormality in the visible lower thoracic spinal cord or conus.   SOFT TISSUES: No paraspinal mass.   DEGENERATIVE:   Visible lower thoracic spine through L2-L3 are negative. L3-L4: Chronic severe disc space loss. Circumferential disc osteophyte complex. Degenerative appearing endplate marrow edema. Mild to moderate facet and ligamentum flavum hypertrophy. No spinal  or lateral recess stenosis. Mild to moderate bilateral L3 neural foraminal stenosis appears stable. L4-L5: Similar advanced chronic disc degeneration and disc space loss. Circumferential disc bulge with mild endplate spurring. Mild posterior element hypertrophy.  No spinal or lateral recess stenosis. Moderate left and mild right L4 neural foraminal stenosis (series 5 image 11 on the left). This level appears stable. L5-S1: Mild chronic retrolisthesis with better preserved disc height and disc signal. Mild circumferential disc bulge. Mild to moderate facet hypertrophy. No spinal or lateral recess stenosis. Mild to moderate bilateral L5 neural foraminal stenosis. This level is stable.   IMPRESSION: 1. Very age-advanced lumbar disc and endplate degeneration at L3-L4 and L4-L5, with superimposed mild degenerative-appearing endplate marrow edema at the former. Mild degenerative retrolisthesis at those levels and L5-S1. 2. But no significant lumbar spinal or lateral recess stenosis. And up to moderate associated neural foraminal stenosis at those levels does not appear significantly changed from a 2023 MRI.Lumbar MRI pending  04/22/24 - DG LUMBAR SPINE - 2-3 VIEW CLINICAL DATA:  Lumbar radicular pain on the right   FINDINGS: There is no evidence of lumbar spine fracture. Degenerative changes of the spine predominantly involving the L3-L4 and, L4-L5 and L5-S1 with associated decreased intervertebral disc space and facet hypertrophy. Grade 1 anterolisthesis of L4-L5.   Soft tissues are unremarkable.   IMPRESSION: Degenerative disc disease involving L3- L4 through L5-S1.   Grade 1 anterolisthesis of L4-L5.  01/07/24 - MRI RIGHT SHOULDER WITHOUT CONTRAST, 01/07/2024 12:08 PM  INDICATION: Pain in right shoulder \ M25.511 Pain in right shoulder ,   FINDINGS:   . Acromioclavicular joint: Mild degenerative changes. No effusion or malalignment.  .  Acromion: No subacromial spurring. Mild  subacromial subdeltoid bursitis.  .  Supraspinatus tendon: Mild tendinosis.  .  Infraspinatus tendon: Mild tendinosis.  .  Subscapularis tendon: Intact.  .  Teres minor tendon: Intact.  .  Long head of biceps: Mild tendinosis above the bicipital groove.  .  Glenohumeral joint: No effusion or malalignment.  .  Labrum: Postsurgical changes related to anterior inferior labral repair/Bankart. Linear T2 signal within the posterior inferior labrum [series 3 image 14-15, series 7 image 16] favors to represent postsurgical changes rather than a labral tear, for correlation with operative notes.  .  Bones: Bony remodeling within the anterior-inferior glenoid without edema like signal, likely a sequelae of anterior shoulder dislocation [series 8 image 16, series 4 image 18]. Remote Hill-Sachs fracture. Normal marrow signal. No fracture, neoplasm, or avascular necrosis.  .  Additional comments: No muscle atrophy.   IMPRESSION:  1. Remodeling of the anterior inferior glenoid likely related to remote bony Bankart fracture and remote Hill-Sachs fracture, likely a sequelae of prior anterior shoulder dislocation.  2.  Postsurgical changes related to labral repair.  3.  Linear T2 signal within the posterior inferior labrum, favors to represent postsurgical changes, for correlation with operative notes.  4.  Mild tendinosis supraspinatus, infraspinatus and long head of biceps.  5.  Mild subacromial/subdeltoid bursitis.  6.  Mild AC joint degenerative changes.   04/07/22 - MRI LUMBAR SPINE WITHOUT CONTRAST  CLINICAL DATA:  Initial evaluation for lumbar degenerative disc disease.   FINDINGS:  Segmentation: Standard. Lowest well-formed disc space labeled the  L5-S1 level.   Alignment: 3 mm degenerative retrolisthesis of L4 on L5, with trace  2 mm retrolisthesis of L3 on L4. Alignment otherwise normal with  preservation of the normal lumbar lordosis.   Vertebrae: Vertebral body height maintained without  acute or chronic  fracture. Bone marrow signal intensity within normal limits. No  worrisome osseous lesions. Discogenic reactive endplate changes  present about the L3-4 and L4-5 interspaces. No other abnormal  marrow edema.  Conus medullaris and cauda equina: Conus extends to the L2 level.  Conus and cauda equina appear normal.   Paraspinal and other soft tissues: Unremarkable.   Disc levels:   L1-2:  Unremarkable.   L2-3:  Unremarkable.   L3-4: Degenerative intervertebral disc space narrowing with  circumferential disc bulge and disc desiccation. Associated reactive  endplate change with marginal endplate osteophytic spurring. Mild  facet hypertrophy. No significant spinal stenosis. Mild bilateral L3  foraminal narrowing. No frank impingement.   L4-5: Degenerative with abscesses narrowing with diffuse disc bulge  and disc desiccation. Associated reactive endplate spurring. Mild  facet and ligament flavum hypertrophy. No significant spinal  stenosis. Mild right with mild to moderate left L4 foraminal  stenosis.   L5-S1: Mild diffuse disc bulge with endplate spurring. Mild  bilateral facet hypertrophy. No spinal stenosis. Mild bilateral L5  foraminal narrowing. No frank impingement.   IMPRESSION:  1. Lower lumbar degenerative disc disease at L3-4 through L5-S1 with  resultant mild to moderate bilateral L3 through L5 foraminal  stenosis as above. No significant spinal stenosis or overt neural  impingement.  2. Mild bilateral facet hypertrophy at L3-4 through L5-S1.   PATIENT SURVEYS:  Oswestry Low Back Pain Disability Questionnaire:  MODIFIED OSWESTRY DISABILITY SCALE Date:  05/13/24   Pain intensity 5 =  Pain medication has no effect on my pain.  2. Personal care (washing, dressing, etc.) 3 =  I need help, but I am able to manage most of my personal care.  3. Lifting 2 = Pain prevents me from lifting heavy weights off the floor,but I can manage if the weights are  conveniently positioned (e.g. on a table)  4. Walking 2 =  Pain prevents me from walking more than  mile.  5. Sitting 3 =  Pain prevents me from sitting more than  hour.  6. Standing 3 =  Pain prevents me from standing more than 1/2 hour.  7. Sleeping 4 =  Even when I take pain medication, I sleep less than 2 hour  8. Social Life 2 = Pain prevents me from participating in more energetic activities (eg. sports, dancing).  9. Traveling 2 =  My pain restricts my travel over 2 hours.  10. Employment/ Homemaking 3 = Pain prevents me from doing anything but light duties.  Total 29/50  % Disability 58.0 % - Severe   Interpretation of scores: Score Category Description  0-20% Minimal Disability The patient can cope with most living activities. Usually no treatment is indicated apart from advice on lifting, sitting and exercise  21-40% Moderate Disability The patient experiences more pain and difficulty with sitting, lifting and standing. Travel and social life are more difficult and they may be disabled from work. Personal care, sexual activity and sleeping are not grossly affected, and the patient can usually be managed by conservative means  41-60% Severe Disability Pain remains the main problem in this group, but activities of daily living are affected. These patients require a detailed investigation  61-80% Crippled Back pain impinges on all aspects of the patient's life. Positive intervention is required  81-100% Bed-bound These patients are either bed-bound or exaggerating their symptoms  Bluford FORBES Zoe DELENA Karon DELENA, et al. Surgery versus conservative management of stable thoracolumbar fracture: the PRESTO feasibility RCT. [place] ([place]): [employer]; 2021 Nov. [facility] Technology Assessment, No. 25.62.) Appendix 3, Oswestry Disability Index category descriptors. Available from: Findjewelers.cz  Minimally Clinically Important Difference (MCID) = 12.8%     NDI:  NECK DISABILITY INDEX Date:  05/13/24   Pain intensity 4 = The pain is very severe at the moment  2. Personal care (washing, dressing, etc.) 3 =  I need some help but can manage most of my personal care  3. Lifting 3 = Pain prevents me from lifting heavy weights but I can manage light to medium   weights if they are conveniently positioned  4. Reading 4 =  I can hardly read at all because of severe pain in my neck  5. Headaches 3 = I have moderate headaches, which come frequently  6. Concentration 1 =  I can concentrate fully when I want to with slight difficulty   7. Work 3 =  I cannot do my usual work  8. Driving 3 = I can't drive my car as long as I want because of moderate pain in my neck  9. Sleeping 4 = My sleep is greatly disturbed (3-5 hrs sleepless)   10. Recreation 4 =  I can hardly do any recreation activities because of pain in my neck  Total 32/50  % Disability 64.0 % - Severe   Minimum Detectable Change (90% confidence): 5 points or 10% points  SCREENING FOR RED FLAGS: Bowel or bladder incontinence: No Spinal tumors: No Cauda equina syndrome: No Compression fracture: No Abdominal aneurysm: No  COGNITION: Overall cognitive status: Within functional limits for tasks assessed     SENSATION: WFL  POSTURE:  rounded shoulders, forward head, decreased lumbar lordosis, flexed trunk , and weight shift left  PALPATION: TTP with increased muscle tension and TPs in L UT and LS TTP in R lumbar paraspinals and glutes, R>L piriformis  CERVICAL ROM:   Active ROM Eval  Flexion 31  Extension 22 - tight  Right lateral flexion 23  Left lateral flexion 20 p!  Right rotation 36  Left rotation 28 p!    (Blank rows = not tested)  UPPER EXTREMITY ROM:  Active ROM Right eval Left eval  Shoulder flexion 120 149  Shoulder extension 34 36  Shoulder abduction 139 144  Shoulder adduction    Shoulder internal rotation L1 L2  Shoulder external rotation T3 T2    (Blank  rows = not tested)  UPPER EXTREMITY MMT:  MMT Right eval Left eval  Shoulder flexion 4+ 4  Shoulder extension 5 5  Shoulder abduction 4+ 4  Shoulder adduction    Shoulder internal rotation 5 5  Shoulder external rotation 4+ 4+  Middle trapezius 4 4  Lower trapezius 3- 3+  (Blank rows = not tested)  LUMBAR ROM:   Active  Eval  Flexion 90% limited  Extension 90% limited  Right lateral flexion Distal femur   Left lateral flexion Lateral femoral condyle   Right rotation 60% limited  Left rotation 60% limited    (Blank rows = not tested)  MUSCLE LENGTH: Hamstrings: mod tight B ITB: mod tight B Piriformis: mod/severe tight B Hip flexors: mod tight B Quads: mod tight B Heelcord:   LOWER EXTREMITY ROM:    Grossly WFL other than limitations due to tightness as above  LOWER EXTREMITY MMT:    MMT Right eval Left eval  Hip flexion 3 3+  Hip extension 2- 2-  Hip abduction 2 2+  Hip adduction 2 2  Hip internal rotation 4 3+  Hip external rotation 4- 4-  Knee flexion 4 4-  Knee extension 4 4  Ankle dorsiflexion 4+ 4  Ankle plantarflexion    Ankle inversion  Ankle eversion      (Blank rows = not tested)  CERVICAL SPECIAL TESTS:  Spurling's test: Negative and Distraction test: Negative  LUMBAR SPECIAL TESTS:  Straight leg raise test: Negative and Slump test: Negative  FUNCTIONAL TESTS:  5 times sit to stand: unable w/o B UE assist; 27.56 sec with B UE assist   TODAY'S TREATMENT:    05/27/24  THERAPEUTIC EXERCISE: To improve strength, endurance, ROM, and flexibility.  Demonstration, verbal and tactile cues throughout for technique.  UBE - L2.0 x 3' each fwd & back Supine R/L HS stretch with strap 1 x 30 with opp LE straight, 3 x 30 with opp knee flexed (preferred) Supine SKTC 3 x 30 bil Hooklying LTR 10 x 5 Hooklying TrA progressing to PPT 10 x 5  NEUROMUSCULAR RE-EDUCATION: To improve coordination, kinesthesia, and posture. Hooklying PPT + GTB B scap  retraction + shoulder horizontal ABD 10 x 3-5 GTB alt hip ABD/ER bent-knee fallouts 10 x 3-5 , 2 sets - cues to control band on return to neutral position Attempted bridge + GTB hip ABD isometric but deferred due to increased pain even with limited lift GTB hip flexion march 2 x 10 Hip ADD isometric ball squeeze 10 x 5, 2 sets  SELF CARE: Provided education on post-surgical precautions and to increase independence with ADLs. Provided initial introduction to BLT precautions and review log rolling and sidelying to sit transitions to reduce low back strain. Reviewed recommended sleeping positions, avoiding prone due to strain on neck and suggesting sidelying with pillow between knees to promote neutral spine alignment.   05/20/24 THERAPEUTIC EXERCISE: To improve strength, endurance, ROM, and flexibility.  UBE L2.0 3 min fwd/ 3 min back Bike L2x47min Seated UT and levator stretches reviewed Seated lumbar flexion stretch touching floor 2x30' Supine LTR both ways 10x5' Supine SKTC R/L 2 x 30' Supine HS stretch 2x30' BLE w/ strap Seated ab sets orange pball 10x5'   05/13/2024 - Eval SELF CARE:  Reviewed eval findings and role of PT in addressing identified deficits as well as instruction in initial HEP (see below).  Patient inquiring about use of estim which is not covered by his [employer], therefore provided information on a home TENS unit for self-purchase.   PATIENT EDUCATION:  Education details: HEP progression - core/lumbopelvic strengthening  Person educated: Patient Education method: Explanation, Demonstration, Verbal cues, Tactile cues, and MedBridgeGO app updated Education comprehension: verbalized understanding, returned demonstration, verbal cues required, tactile cues required, and needs further education  HOME EXERCISE PROGRAM: *Pt using MedBridgeGO app.  Access Code: [email or web address] URL: [email or web address] Date: 05/27/2024 Prepared by: Elijah Hidden  Exercises - Seated Gentle Upper Trapezius Stretch (Mirrored)  - 2-3 x daily - 7 x weekly - 3 reps - 30 sec hold - Gentle Levator Scapulae Stretch  - 2-3 x daily - 7 x weekly - 3 reps - 30 sec hold - Seated 3 Way Exercise Ball Roll Out Stretch  - 2-3 x daily - 7 x weekly - 10 reps - 5 sec hold - Supine Lower Trunk Rotation  - 2-3 x daily - 7 x weekly - 10 reps - 5-10 sec hold - Hooklying Hamstring Stretch with Strap  - 2-3 x daily - 7 x weekly - 3 reps - 30 sec hold - Hooklying Single Knee to Chest Stretch  - 2-3 x daily - 7 x weekly - 3 reps - 30 sec hold - Supine Posterior Pelvic Tilt  - 1 x daily - 7  x weekly - 2 sets - 10 reps - 5 sec hold - Supine Hip Adduction Isometric with Ball  - 1 x daily - 7 x weekly - 2 sets - 10 reps - 5 sec hold - Supine Shoulder Horizontal Abduction with Resistance  - 1 x daily - 7 x weekly - 2 sets - 10 reps - 3 sec hold - Hooklying Single Leg Bent Knee Fallouts with Resistance  - 1 x daily - 7 x weekly - 2 sets - 10 reps - 3 sec hold - Supine March with Resistance Band  - 1 x daily - 7 x weekly - 2 sets - 10 reps - 2-3 sec hold hold  Patient Education - TENS Therapy - TENS UNIT - AUVON Dual Channel TENS Unit   ASSESSMENT:  CLINICAL IMPRESSION: Brett Allen reports his lumbar fusion surgery has been scheduled for 07/02/24.  Pain levels unchanged today with patient stating his back pain is always 7/10.  He does note that the stretches and gentle ROM from his HEP and his last PT visit seem to feel good, therefore reviewed lumbar component of HEP and progressed lumbopelvic flexibility and strengthening, updating HEP per patient tolerance for exercises.  We reviewed the log rolling and sidelying to sit as well as drawbacks for stomach sleeping in relation to his neck pain, but will plan for more formal posture and body mechanics instruction next visit.  Post-op BLT precautions also introduced and will be elaborated on in upcoming visits.  Brett Allen will benefit from  continued skilled PT to address ongoing cervical and lumbar ROM/flexibility, postural, core and proximal UE/LE strength deficits to improve mobility and activity tolerance with decreased pain interference.    EVAL: Brett Allen is a 48 y.o. male who was referred to physical therapy for evaluation and treatment for acute L-sided neck pain and chronic R-sided lumbar pain with intermittent R LE radicular pain, numbness and tingling.  He also reports chronic R shoulder pain and limited ROM following surgery ~1 yr ago s/p chronic dislocations.   Patient reports onset of L-sided neck pain beginning ~1 month ago while brushing his teeth.  Neck pain is intermittent, worse with sleeping positions and turning his head.  Patient reports onset of R-sided low back pain 17+ years ago, exacerbated 2-3 yrs ago with a job requiring repetitive bending, lifting and twisting. Low back pain is chronic and constant, with electric shock down R LE to knee and ankle starting ~2 months ago, worse with prolonged sitting or standing.  Patient has deficits in cervical and lumber ROM, B LE flexibility, core/postural and B UE/LE strength, abnormal posture, and TTP with abnormal muscle tension which are interfering with ADLs and are impacting quality of life.  On NDI patient scored 32/50 demonstrating 64% or severe disability.  On Modified Oswestry patient scored 29/50 demonstrating 58% or severe disability.  Brett Allen will benefit from skilled PT to address above deficits to improve mobility and activity tolerance with decreased pain interference.  OBJECTIVE IMPAIRMENTS: Abnormal gait, decreased activity tolerance, decreased endurance, decreased knowledge of condition, decreased mobility, difficulty walking, decreased ROM, decreased strength, hypomobility, increased fascial restrictions, impaired perceived functional ability, increased muscle spasms, impaired flexibility, impaired sensation, impaired UE functional use, improper body  mechanics, postural dysfunction, and pain.   ACTIVITY LIMITATIONS: carrying, lifting, bending, sitting, standing, squatting, sleeping, stairs, transfers, bed mobility, bathing, dressing, hygiene/grooming, locomotion level, and caring for others  PARTICIPATION LIMITATIONS: cleaning, laundry, driving, shopping, community activity, occupation, and yard work  PERSONAL FACTORS: Fitness,  Past/current experiences, Time since onset of injury/illness/exacerbation, and 3+ comorbidities: R shoulder surgery 05/15/23, chronic R shoulder dislocation/subluxation, excision of neck tumor 04/02/23, DM, HTN, sarcoidosis, chronic LBP are also affecting patient's functional outcome.   REHAB POTENTIAL: Good  CLINICAL DECISION MAKING: Unstable/unpredictable  EVALUATION COMPLEXITY: High   GOALS: Goals reviewed with patient? Yes  SHORT TERM GOALS: Target date: 06/10/2024  Patient will be independent with initial HEP to improve outcomes and carryover.  Baseline: HEP initiated on eval Goal status: MET - 05/27/24  2.  Patient will report 25% improvement in neck and low back pain to improve QOL. Baseline: Neck = 5/10 & Low back = 7/10 on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

## 2024-05-29 ENCOUNTER — Ambulatory Visit

## 2024-05-30 ENCOUNTER — Other Ambulatory Visit (HOSPITAL_BASED_OUTPATIENT_CLINIC_OR_DEPARTMENT_OTHER): Payer: Self-pay

## 2024-06-03 ENCOUNTER — Other Ambulatory Visit (HOSPITAL_BASED_OUTPATIENT_CLINIC_OR_DEPARTMENT_OTHER): Payer: Self-pay

## 2024-06-03 ENCOUNTER — Ambulatory Visit: Admitting: Physical Therapy

## 2024-06-03 ENCOUNTER — Encounter: Payer: Self-pay | Admitting: Physical Therapy

## 2024-06-03 DIAGNOSIS — M542 Cervicalgia: Secondary | ICD-10-CM

## 2024-06-03 DIAGNOSIS — R2689 Other abnormalities of gait and mobility: Secondary | ICD-10-CM

## 2024-06-03 DIAGNOSIS — R293 Abnormal posture: Secondary | ICD-10-CM

## 2024-06-03 DIAGNOSIS — G8929 Other chronic pain: Secondary | ICD-10-CM

## 2024-06-03 DIAGNOSIS — M6281 Muscle weakness (generalized): Secondary | ICD-10-CM

## 2024-06-03 NOTE — Therapy (Signed)
 OUTPATIENT PHYSICAL THERAPY TREATMENT   Patient Name: Brett Allen MRN: [id number] DOB:Nov 17, 1975, 48 y.o., male Today's Date: 06/03/2024   END OF SESSION:  PT End of Session - 06/03/24 1017     Visit Number 4    Date for Recertification  07/08/24    Authorization Type [employer]    Authorization Time Period 05/13/24 - 08/11/24    Authorization - Visit Number 4    Authorization - Number of Visits 14    PT Start Time 1017    PT Stop Time 1103    PT Time Calculation (min) 46 min    Activity Tolerance Patient tolerated treatment well    Behavior During Therapy WFL for tasks assessed/performed             Past Medical History:  Diagnosis Date   Back pain    lower   Cellulitis    Diabetes mellitus without complication (HCC)    Hypertension    Sarcoidosis    right eye   Past Surgical History:  Procedure Laterality Date   left wrsit surgery Left    WRIST FRACTURE SURGERY Left 2017   Patient Active Problem List   Diagnosis Date Noted   Cellulitis 10/27/2022   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   Chronic bilateral low back pain without sciatica 07/23/2015   Hyperlipidemia 07/09/2015    PCP: Dorina Loving, PA-C   REFERRING PROVIDER: Gillie Duncans, MD   REFERRING DIAG: M54.2 (ICD-10-CM) - Cervicalgia  Lumbar & Cervical pain  THERAPY DIAG:  Cervicalgia  Chronic right-sided low back pain with right-sided sciatica  Muscle weakness (generalized)  Abnormal posture  Other abnormalities of gait and mobility  RATIONALE FOR EVALUATION AND TREATMENT: Rehabilitation  ONSET DATE: 04/27/24 - Neck pain;  Chronic - Lumbar pain  NEXT MD VISIT: 05/19/24   SUBJECTIVE:                                                                                                                                                                                                         SUBJECTIVE STATEMENT: Pt reports he has been fitted for his TLSO for after  surgery.  Back pain remains unchanged - always 7/10, however neck more sore today - still likely being triggered by sleeping in prone.  EVAL: Pt reports continued limitations in R shoulder since his surgery on 05/15/23.  He reports he reinjured his shoulder during work hardening/conditioning while preparing to return to work.  He states he has been given a 15% disability because of this which has kept him out  of work.  His neck pain has been bad for about a 1 month, triggered as he went to brush his teeth one morning.  Pain comes and goes but throbs all day long when present, limiting his ability to turn his head and chew.  Low back pain has been chronic for 17+ years (since moving to [place] in 2008).  Has h/o chiropractor work for his back - seen 3-4x in past month with ~3 visits remaining.  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  Feels like he is crooked and leaning forward when he walks.   PAIN: Are you having pain? Yes: NPRS scale: always 7/10  Pain location: midline low back to R, electric shock down R LE to knee and ankle starting ~2 months ago (trigger by prolonged sitting and standing)  Pain description: stabbing, locks up at times  Aggravating factors: prolonged sitting or standing, rising from bed in the morning  Relieving factors: meds take the edge off, heat, chiropractor - nothing really lasts   Are you having pain? Yes: NPRS scale: 7/10  Pain location: L lateral neck & upper shoulder  Pain description: throbbing on a normal day, sharp stabbing on bad day with massive headache  Aggravating factors: turning head to L, lying in awkward positions (esp on L side)   Relieving factors: heat, compression, meds (flexeril  at night, ibuprofen )   Are you having pain? Yes: NPRS scale: 5/10  Pain location: R anterior shoulder  Pain description: stinging, crunching, occasional  numbness, TTP, throbbing  Aggravating factors: end range ER, lifting, sports  Relieving factors: ice, ibuprofen    PERTINENT HISTORY:  R shoulder surgery 05/15/23, chronic R shoulder dislocation/subluxation, excision of neck tumor 04/02/23, DM, HTN, sarcoidosis, chronic LBP    Lumbar fusion surgery scheduled for 07/02/2024  PRECAUTIONS: None  HAND DOMINANCE: Right  RED FLAGS: None  WEIGHT BEARING RESTRICTIONS: No  FALLS:  Has patient fallen in last 6 months? No  LIVING ENVIRONMENT: Lives with: lives with their family Lives in: House Stairs: Yes: Internal: 10-12 steps; on right going up and External: 1 steps; none Has following equipment at home: None  OCCUPATION: Unemployed  PLOF: Independent with gait, Independent with transfers, Needs assistance with ADLs, and Leisure: walking; play video games, play basketball - currently unable  PATIENT GOALS: Some type of relief of my pain.    OBJECTIVE:   DIAGNOSTIC FINDINGS:  Neck x-ray unavailable  05/06/24 - MRI LUMBAR SPINE CLINICAL HISTORY: 48 year old male with spondylolisthesis, lumbar region.   FINDINGS:   BONES AND ALIGNMENT: Normal lumbar segmentation on the comparison radiographs. Stable lordosis since 2023. Mild degenerative retrolisthesis from L3-L4 through L5-S1. Normal background bone marrow signal. Chronic degenerative endplate marrow signal changes at L3-L4 and L4-L5, with superimposed degenerative appearing left lateral endplate marrow edema (left greater than right) at L3-L4 (series 6 image 11). No associated paraspinal soft tissue inflammation. No other marrow edema.   SPINAL CORD: Normal conus medullaris at L1-L2. No signal abnormality in the visible lower thoracic spinal cord or conus.   SOFT TISSUES: No paraspinal mass.   DEGENERATIVE:   Visible lower thoracic spine through L2-L3 are negative. L3-L4: Chronic severe disc space loss. Circumferential disc osteophyte complex. Degenerative  appearing endplate marrow edema. Mild to moderate facet and ligamentum flavum hypertrophy. No spinal or lateral recess stenosis. Mild to moderate bilateral L3 neural foraminal stenosis appears stable. L4-L5: Similar advanced chronic disc degeneration  and disc space loss. Circumferential disc bulge with mild endplate spurring. Mild posterior element hypertrophy. No spinal or lateral recess stenosis. Moderate left and mild right L4 neural foraminal stenosis (series 5 image 11 on the left). This level appears stable. L5-S1: Mild chronic retrolisthesis with better preserved disc height and disc signal. Mild circumferential disc bulge. Mild to moderate facet hypertrophy. No spinal or lateral recess stenosis. Mild to moderate bilateral L5 neural foraminal stenosis. This level is stable.   IMPRESSION: 1. Very age-advanced lumbar disc and endplate degeneration at L3-L4 and L4-L5, with superimposed mild degenerative-appearing endplate marrow edema at the former. Mild degenerative retrolisthesis at those levels and L5-S1. 2. But no significant lumbar spinal or lateral recess stenosis. And up to moderate associated neural foraminal stenosis at those levels does not appear significantly changed from a 2023 MRI.Lumbar MRI pending  04/22/24 - DG LUMBAR SPINE - 2-3 VIEW CLINICAL DATA:  Lumbar radicular pain on the right   FINDINGS: There is no evidence of lumbar spine fracture. Degenerative changes of the spine predominantly involving the L3-L4 and, L4-L5 and L5-S1 with associated decreased intervertebral disc space and facet hypertrophy. Grade 1 anterolisthesis of L4-L5.   Soft tissues are unremarkable.   IMPRESSION: Degenerative disc disease involving L3- L4 through L5-S1.   Grade 1 anterolisthesis of L4-L5.  01/07/24 - MRI RIGHT SHOULDER WITHOUT CONTRAST, 01/07/2024 12:08 PM  INDICATION: Pain in right shoulder \ M25.511 Pain in right shoulder ,   FINDINGS:   . Acromioclavicular joint:  Mild degenerative changes. No effusion or malalignment.  .  Acromion: No subacromial spurring. Mild subacromial subdeltoid bursitis.  .  Supraspinatus tendon: Mild tendinosis.  .  Infraspinatus tendon: Mild tendinosis.  .  Subscapularis tendon: Intact.  .  Teres minor tendon: Intact.  .  Long head of biceps: Mild tendinosis above the bicipital groove.  .  Glenohumeral joint: No effusion or malalignment.  .  Labrum: Postsurgical changes related to anterior inferior labral repair/Bankart. Linear T2 signal within the posterior inferior labrum [series 3 image 14-15, series 7 image 16] favors to represent postsurgical changes rather than a labral tear, for correlation with operative notes.  .  Bones: Bony remodeling within the anterior-inferior glenoid without edema like signal, likely a sequelae of anterior shoulder dislocation [series 8 image 16, series 4 image 18]. Remote Hill-Sachs fracture. Normal marrow signal. No fracture, neoplasm, or avascular necrosis.  .  Additional comments: No muscle atrophy.   IMPRESSION:  1. Remodeling of the anterior inferior glenoid likely related to remote bony Bankart fracture and remote Hill-Sachs fracture, likely a sequelae of prior anterior shoulder dislocation.  2.  Postsurgical changes related to labral repair.  3.  Linear T2 signal within the posterior inferior labrum, favors to represent postsurgical changes, for correlation with operative notes.  4.  Mild tendinosis supraspinatus, infraspinatus and long head of biceps.  5.  Mild subacromial/subdeltoid bursitis.  6.  Mild AC joint degenerative changes.   04/07/22 - MRI LUMBAR SPINE WITHOUT CONTRAST  CLINICAL DATA:  Initial evaluation for lumbar degenerative disc disease.   FINDINGS:  Segmentation: Standard. Lowest well-formed disc space labeled the  L5-S1 level.   Alignment: 3 mm degenerative retrolisthesis of L4 on L5, with trace  2 mm retrolisthesis of L3 on L4. Alignment otherwise normal with   preservation of the normal lumbar lordosis.   Vertebrae: Vertebral body height maintained without acute or chronic  fracture. Bone marrow signal intensity within normal limits. No  worrisome osseous lesions. Discogenic reactive endplate changes  present about the L3-4 and L4-5 interspaces. No other abnormal  marrow edema.   Conus medullaris and cauda equina: Conus extends to the L2 level.  Conus and cauda equina appear normal.   Paraspinal and other soft tissues: Unremarkable.   Disc levels:   L1-2:  Unremarkable.   L2-3:  Unremarkable.   L3-4: Degenerative intervertebral disc space narrowing with  circumferential disc bulge and disc desiccation. Associated reactive  endplate change with marginal endplate osteophytic spurring. Mild  facet hypertrophy. No significant spinal stenosis. Mild bilateral L3  foraminal narrowing. No frank impingement.   L4-5: Degenerative with abscesses narrowing with diffuse disc bulge  and disc desiccation. Associated reactive endplate spurring. Mild  facet and ligament flavum hypertrophy. No significant spinal  stenosis. Mild right with mild to moderate left L4 foraminal  stenosis.   L5-S1: Mild diffuse disc bulge with endplate spurring. Mild  bilateral facet hypertrophy. No spinal stenosis. Mild bilateral L5  foraminal narrowing. No frank impingement.   IMPRESSION:  1. Lower lumbar degenerative disc disease at L3-4 through L5-S1 with  resultant mild to moderate bilateral L3 through L5 foraminal  stenosis as above. No significant spinal stenosis or overt neural  impingement.  2. Mild bilateral facet hypertrophy at L3-4 through L5-S1.   PATIENT SURVEYS:  Oswestry Low Back Pain Disability Questionnaire:  MODIFIED OSWESTRY DISABILITY SCALE Date:  05/13/24   Pain intensity 5 =  Pain medication has no effect on my pain.  2. Personal care (washing, dressing, etc.) 3 =  I need help, but I am able to manage most of my personal care.  3. Lifting 2  = Pain prevents me from lifting heavy weights off the floor,but I can manage if the weights are conveniently positioned (e.g. on a table)  4. Walking 2 =  Pain prevents me from walking more than  mile.  5. Sitting 3 =  Pain prevents me from sitting more than  hour.  6. Standing 3 =  Pain prevents me from standing more than 1/2 hour.  7. Sleeping 4 =  Even when I take pain medication, I sleep less than 2 hour  8. Social Life 2 = Pain prevents me from participating in more energetic activities (eg. sports, dancing).  9. Traveling 2 =  My pain restricts my travel over 2 hours.  10. Employment/ Homemaking 3 = Pain prevents me from doing anything but light duties.  Total 29/50  % Disability 58.0 % - Severe   Interpretation of scores: Score Category Description  0-20% Minimal Disability The patient can cope with most living activities. Usually no treatment is indicated apart from advice on lifting, sitting and exercise  21-40% Moderate Disability The patient experiences more pain and difficulty with sitting, lifting and standing. Travel and social life are more difficult and they may be disabled from work. Personal care, sexual activity and sleeping are not grossly affected, and the patient can usually be managed by conservative means  41-60% Severe Disability Pain remains the main problem in this group, but activities of daily living are affected. These patients require a detailed investigation  61-80% Crippled Back pain impinges on all aspects of the patients life. Positive intervention is required  81-100% Bed-bound These patients are either bed-bound or exaggerating their symptoms  Bluford FORBES Zoe DELENA Karon DELENA, et al. Surgery versus conservative management of stable thoracolumbar fracture: the PRESTO feasibility RCT. [place] ([place]): [employer]; 2021 Nov. [facility] Technology Assessment, No. 25.62.) Appendix 3, Oswestry Disability Index category descriptors. Available  from:  [email or web address].cz  Minimally Clinically Important Difference (MCID) = 12.8%    NDI:  NECK DISABILITY INDEX Date:  05/13/24   Pain intensity 4 = The pain is very severe at the moment  2. Personal care (washing, dressing, etc.) 3 =  I need some help but can manage most of my personal care  3. Lifting 3 = Pain prevents me from lifting heavy weights but I can manage light to medium   weights if they are conveniently positioned  4. Reading 4 =  I can hardly read at all because of severe pain in my neck  5. Headaches 3 = I have moderate headaches, which come frequently  6. Concentration 1 =  I can concentrate fully when I want to with slight difficulty   7. Work 3 =  I cannot do my usual work  8. Driving 3 = I can't drive my car as long as I want because of moderate pain in my neck  9. Sleeping 4 = My sleep is greatly disturbed (3-5 hrs sleepless)   10. Recreation 4 =  I can hardly do any recreation activities because of pain in my neck  Total 32/50  % Disability 64.0 % - Severe   Minimum Detectable Change (90% confidence): 5 points or 10% points  SCREENING FOR RED FLAGS: Bowel or bladder incontinence: No Spinal tumors: No Cauda equina syndrome: No Compression fracture: No Abdominal aneurysm: No  COGNITION: Overall cognitive status: Within functional limits for tasks assessed     SENSATION: WFL  POSTURE:  rounded shoulders, forward head, decreased lumbar lordosis, flexed trunk , and weight shift left  PALPATION: TTP with increased muscle tension and TPs in L UT and LS TTP in R lumbar paraspinals and glutes, R>L piriformis  CERVICAL ROM:   Active ROM Eval  Flexion 31  Extension 22 - tight  Right lateral flexion 23  Left lateral flexion 20 p!  Right rotation 36  Left rotation 28 p!    (Blank rows = not tested)  UPPER EXTREMITY ROM:  Active ROM Right eval Left eval  Shoulder flexion 120 149  Shoulder extension 34 36  Shoulder abduction 139  144  Shoulder adduction    Shoulder internal rotation L1 L2  Shoulder external rotation T3 T2    (Blank rows = not tested)  UPPER EXTREMITY MMT:  MMT Right eval Left eval  Shoulder flexion 4+ 4  Shoulder extension 5 5  Shoulder abduction 4+ 4  Shoulder adduction    Shoulder internal rotation 5 5  Shoulder external rotation 4+ 4+  Middle trapezius 4 4  Lower trapezius 3- 3+  (Blank rows = not tested)  LUMBAR ROM:   Active  Eval  Flexion 90% limited  Extension 90% limited  Right lateral flexion Distal femur   Left lateral flexion Lateral femoral condyle   Right rotation 60% limited  Left rotation 60% limited    (Blank rows = not tested)  MUSCLE LENGTH: Hamstrings: mod tight B ITB: mod tight B Piriformis: mod/severe tight B Hip flexors: mod tight B Quads: mod tight B Heelcord:   LOWER EXTREMITY ROM:    Grossly WFL other than limitations due to tightness as above  LOWER EXTREMITY MMT:    MMT Right eval Left eval  Hip flexion 3 3+  Hip extension 2- 2-  Hip abduction 2 2+  Hip adduction 2 2  Hip internal rotation 4 3+  Hip external rotation 4- 4-  Knee flexion 4 4-  Knee  extension 4 4  Ankle dorsiflexion 4+ 4  Ankle plantarflexion    Ankle inversion    Ankle eversion      (Blank rows = not tested)  CERVICAL SPECIAL TESTS:  Spurling's test: Negative and Distraction test: Negative  LUMBAR SPECIAL TESTS:  Straight leg raise test: Negative and Slump test: Negative  FUNCTIONAL TESTS:  5 times sit to stand: unable w/o B UE assist; 27.56 sec with B UE assist   TODAY'S TREATMENT:    06/03/24  THERAPEUTIC EXERCISE: To improve strength, endurance, ROM, and flexibility.  Demonstration, verbal and tactile cues throughout for technique.  Rec Bike - L2 x 6' Standing lumbar extension with forearms on wall 10 x 5  MANUAL THERAPY: To promote normalized muscle tension, improved flexibility, and reduced pain utilizing connective tissue massage, therapeutic massage,  manual TP therapy, and percussion massage with massage gun.  STM/DTM and percussion massage to R>L UT, LS and scalenes  SELF CARE: Provided education on post-surgical precautions and to facilitate performance of basic household cleaning/chores. Provided education in proper posture and body mechanics for typical daily positioning and household chores to minimize strain on low back and neck, emphasizing BLT precautions for the immediate post-op period.  NEUROMUSCULAR RE-EDUCATION: To improve coordination, kinesthesia, posture, and proprioception.  Quadruped over orange Pball: Alt LE/hip extension x 10 - VC & TC to maintain neutral spine avoiding trunk rotation Alt UE raises x 10   05/27/24  THERAPEUTIC EXERCISE: To improve strength, endurance, ROM, and flexibility.  Demonstration, verbal and tactile cues throughout for technique.  UBE - L2.0 x 3' each fwd & back Supine R/L HS stretch with strap 1 x 30 with opp LE straight, 3 x 30 with opp knee flexed (preferred) Supine SKTC 3 x 30 bil Hooklying LTR 10 x 5 Hooklying TrA progressing to PPT 10 x 5  NEUROMUSCULAR RE-EDUCATION: To improve coordination, kinesthesia, and posture. Hooklying PPT + GTB B scap retraction + shoulder horizontal ABD 10 x 3-5 GTB alt hip ABD/ER bent-knee fallouts 10 x 3-5 , 2 sets - cues to control band on return to neutral position Attempted bridge + GTB hip ABD isometric but deferred due to increased pain even with limited lift GTB hip flexion march 2 x 10 Hip ADD isometric ball squeeze 10 x 5, 2 sets  SELF CARE: Provided education on post-surgical precautions and to increase independence with ADLs. Provided initial introduction to BLT precautions and review log rolling and sidelying to sit transitions to reduce low back strain. Reviewed recommended sleeping positions, avoiding prone due to strain on neck and suggesting sidelying with pillow between knees to promote neutral spine  alignment.   05/20/24 THERAPEUTIC EXERCISE: To improve strength, endurance, ROM, and flexibility.  UBE L2.0 3 min fwd/ 3 min back Bike L2x72min Seated UT and levator stretches reviewed Seated lumbar flexion stretch touching floor 2x30' Supine LTR both ways 10x5' Supine SKTC R/L 2 x 30' Supine HS stretch 2x30' BLE w/ strap Seated ab sets orange pball 10x5'   05/13/2024 - Eval SELF CARE:  Reviewed eval findings and role of PT in addressing identified deficits as well as instruction in initial HEP (see below).  Patient inquiring about use of estim which is not covered by his [employer], therefore provided information on a home TENS unit for self-purchase.   PATIENT EDUCATION:  Education details: HEP progression - core/lumbopelvic strengthening  Person educated: Patient Education method: Explanation, Demonstration, Verbal cues, Tactile cues, and MedBridgeGO app updated Education comprehension: verbalized understanding, returned  demonstration, verbal cues required, tactile cues required, and needs further education  HOME EXERCISE PROGRAM: *Pt using MedBridgeGO app.  Access Code: [email or web address] URL: [email or web address] Date: 06/03/2024 Prepared by: Elijah Hidden  Exercises - Seated Gentle Upper Trapezius Stretch (Mirrored)  - 2-3 x daily - 7 x weekly - 3 reps - 30 sec hold - Gentle Levator Scapulae Stretch  - 2-3 x daily - 7 x weekly - 3 reps - 30 sec hold - Seated 3 Way Exercise Ball Roll Out Stretch  - 2-3 x daily - 7 x weekly - 10 reps - 5 sec hold - Supine Lower Trunk Rotation  - 2-3 x daily - 7 x weekly - 10 reps - 5-10 sec hold - Hooklying Hamstring Stretch with Strap  - 2-3 x daily - 7 x weekly - 3 reps - 30 sec hold - Hooklying Single Knee to Chest Stretch  - 2-3 x daily - 7 x weekly - 3 reps - 30 sec hold - Supine Posterior Pelvic Tilt  - 1 x daily - 7 x weekly - 2 sets - 10 reps - 5 sec hold - Supine Hip Adduction Isometric with Ball  - 1 x daily - 7 x  weekly - 2 sets - 10 reps - 5 sec hold - Supine Shoulder Horizontal Abduction with Resistance  - 1 x daily - 7 x weekly - 2 sets - 10 reps - 3 sec hold - Hooklying Single Leg Bent Knee Fallouts with Resistance  - 1 x daily - 7 x weekly - 2 sets - 10 reps - 3 sec hold - Supine March with Resistance Band  - 1 x daily - 7 x weekly - 2 sets - 10 reps - 2-3 sec hold hold  Patient Education - TENS Therapy - TENS UNIT - AUVON Dual Channel TENS Unit - Post-Op Back Precautions - Posture and Body Mechanics   ASSESSMENT:  CLINICAL IMPRESSION: Brett Allen reports his back pain remains constant at 7/10 but notes neck pain is worse today, likely triggered by sleeping in prone.  TPs identified in R UT, LS and scalenes which were addressed with MT including percussion massage with massage gun with pt noting relief of some of the muscle tension with this.  Expanded on BLT precautions introduced last visit and provided education in proper posture and body mechanics for typical daily positioning and household chores to minimize strain on low back and neck, with specific attention give to BLT precautions for post-surgery.  He notes limited ability to extend torso upright upon getting up in the morning, therefore attempted POE and standing lumbar extension at wall to facilitate increased ease of achieving upright posture.  Progressed strengthening to include thoracolumbar stabilization in quadruped over orange Pball to minimize need for weightbearing on R shoulder.  Brett Allen will benefit from continued skilled PT to address ongoing cervical and lumbar ROM/flexibility, postural, core and proximal UE/LE strength deficits to improve mobility and activity tolerance with decreased pain interference.    EVAL: Brett Allen is a 48 y.o. male who was referred to physical therapy for evaluation and treatment for acute L-sided neck pain and chronic R-sided lumbar pain with intermittent R LE radicular pain, numbness and tingling.  He  also reports chronic R shoulder pain and limited ROM following surgery ~1 yr ago s/p chronic dislocations.   Patient reports onset of L-sided neck pain beginning ~1 month ago while brushing his teeth.  Neck pain is intermittent, worse with sleeping positions and turning his head.  Patient reports onset of R-sided low back pain 17+ years ago, exacerbated 2-3 yrs ago with a job requiring repetitive bending, lifting and twisting. Low back pain is chronic and constant, with electric shock down R LE to knee and ankle starting ~2 months ago, worse with prolonged sitting or standing.  Patient has deficits in cervical and lumber ROM, B LE flexibility, core/postural and B UE/LE strength, abnormal posture, and TTP with abnormal muscle tension which are interfering with ADLs and are impacting quality of life.  On NDI patient scored 32/50 demonstrating 64% or severe disability.  On Modified Oswestry patient scored 29/50 demonstrating 58% or severe disability.  Brett Allen will benefit from skilled PT to address above deficits to improve mobility and activity tolerance with decreased pain interference.  OBJECTIVE IMPAIRMENTS: Abnormal gait, decreased activity tolerance, decreased endurance, decreased knowledge of condition, decreased mobility, difficulty walking, decreased ROM, decreased strength, hypomobility, increased fascial restrictions, impaired perceived functional ability, increased muscle spasms, impaired flexibility, impaired sensation, impaired UE functional use, improper body mechanics, postural dysfunction, and pain.   ACTIVITY LIMITATIONS: carrying, lifting, bending, sitting, standing, squatting, sleeping, stairs, transfers, bed mobility, bathing, dressing, hygiene/grooming, locomotion level, and caring for others  PARTICIPATION LIMITATIONS: cleaning, laundry, driving, shopping, community activity, occupation, and yard work  PERSONAL FACTORS: Fitness, Past/current experiences, Time since onset of  injury/illness/exacerbation, and 3+ comorbidities: R shoulder surgery 05/15/23, chronic R shoulder dislocation/subluxation, excision of neck tumor 04/02/23, DM, HTN, sarcoidosis, chronic LBP are also affecting patient's functional outcome.   REHAB POTENTIAL: Good  CLINICAL DECISION MAKING: Unstable/unpredictable  EVALUATION COMPLEXITY: High   GOALS: Goals reviewed with patient? Yes  SHORT TERM GOALS: Target date: 06/10/2024  Patient will be independent with initial HEP to improve outcomes and carryover.  Baseline: HEP initiated on eval Goal status: MET - 05/27/24  2.  Patient will report 25% improvement in neck and low back pain to improve QOL. Baseline: Neck = 5/10 & Low back = 7/10 on eval, both up to 10/10 at worst Goal status: IN PROGRESS - 06/03/24 - pain essentially unchanged,except neck pain slightly worse (likely triggered by sleeping in prone)  3.  Patient will demonstrate improved posture to decrease muscle imbalance. Baseline: forward head, rounded shoulders, decreased lumbar lordosis, flexed trunk, and weight shift left Goal status: IN PROGRESS -06/03/24 - improving posture in standing but still with tendency for fwd head, rounded shoulders and increased trunk flexion in sitting  LONG TERM GOALS: Target date: 07/08/2024  Patient will be independent with ongoing/advanced HEP for self-management at home.  Baseline:  Goal status: INITIAL  2.  Patient will report 50-75% improvement in neck and low back pain to improve QOL.  Baseline: Neck = 5/10 & Low back = 7/10 on eval, both up to 10/10 at worst Goal status: INITIAL  3.  Patient to demonstrate ability to achieve and maintain good spinal alignment and body mechanics needed for daily activities. Baseline:  Goal status: IN PROGRESS - 06/03/24 - education provided in proper posture and body mechanics for typical daily positioning and household chores to minimize strain on low back and neck as well as post-op BLT  precautions   4.  Patient will demonstrate functional pain free cervical ROM for safety with driving.  Baseline: Refer to above cervical ROM table Goal status: INITIAL  5.  Patient will demonstrate functional pain free lumbar ROM to perform ADLs.   Baseline: Refer to above lumbar ROM table Goal status: INITIAL  7.  Patient will demonstrate improved functional strength as demonstrated  by improved B postural and UE/LE strength to >/= 4 to 4+/5. Baseline: Refer to above UE/LE MMT tables Goal status: INITIAL  6.  Patient will report </= 54% on NDI (MCID = 10%) to demonstrate improved functional ability.  Baseline: 32 / 50 = 64.0 % Goal status: INITIAL   7.  Patient will report </= 46% on Modified Oswestry (MCID = 12%) to demonstrate improved functional ability.  Baseline: 29 / 50 = 58.0 % Goal status: INITIAL  8.  Patient to report ability to perform ADLs, household, and work-related tasks without need for assistance of his wife. Baseline: needs assistance of wife with washing his back and most household chores Goal status: INITIAL   9.  Patient will tolerate >30 min of sitting or standing w/o increase in pain or radicular symptoms to allow for increased ease of ADL performance. Baseline: Back pain prevents sitting or standing for >30 minutes Goal status: INITIAL  10.  Patient will report centralization of R LE radicular symptoms.  Baseline: electric shock down R LE to knee and ankle Goal status: INITIAL   PLAN:  PT FREQUENCY: 2x/week  PT DURATION: 8 weeks  PLANNED INTERVENTIONS: 02835- PT Re-evaluation, 97750- Physical Performance Testing, 97110-Therapeutic exercises, 97530- Therapeutic activity, 97112- Neuromuscular re-education, 97535- Self Care, 02859- Manual therapy, [phone]- Gait training, [phone]- Aquatic Therapy, [phone]- Ultrasound, [phone] (1-2 muscles), [phone] (3+ muscles)- Dry Needling, Patient/Family education, Balance training, Stair training, Taping, Joint mobilization,  Spinal mobilization, Cryotherapy, and Moist heat  PLAN FOR NEXT SESSION: gently progress cervical and lumbar ROM/stretching; review & progress HEP PRN; MT +/- TPDN to address TPs/abnormal muscle tension in L UT, lumbar paraspinals, glues and piriformis; review posture & body mechanics education and BLT precautions PRN    Elijah CHRISTELLA Hidden, PT 06/03/2024, 11:31 AM

## 2024-06-05 ENCOUNTER — Ambulatory Visit: Admitting: Physical Therapy

## 2024-06-06 ENCOUNTER — Telehealth: Payer: Self-pay | Admitting: Medical

## 2024-06-06 ENCOUNTER — Other Ambulatory Visit (HOSPITAL_BASED_OUTPATIENT_CLINIC_OR_DEPARTMENT_OTHER): Payer: Self-pay

## 2024-06-06 DIAGNOSIS — M5416 Radiculopathy, lumbar region: Secondary | ICD-10-CM

## 2024-06-06 MED ORDER — DOXYCYCLINE HYCLATE 100 MG PO CAPS
100.0000 mg | ORAL_CAPSULE | Freq: Two times a day (BID) | ORAL | 0 refills | Status: DC
  Filled 2024-06-06: qty 14, 7d supply, fill #0

## 2024-06-06 NOTE — Telephone Encounter (Signed)
 I just put order for cane. I tried to print. That order has my npi number. I am trying to print that order but was not able to. Can you figure out how to print or how to order. Order needs my npi number on. Then fax it to [facility].

## 2024-06-06 NOTE — Telephone Encounter (Signed)
 Faxed [facility] office notes and I was able to print the order with it as well

## 2024-06-09 ENCOUNTER — Other Ambulatory Visit (HOSPITAL_BASED_OUTPATIENT_CLINIC_OR_DEPARTMENT_OTHER): Payer: Self-pay

## 2024-06-09 ENCOUNTER — Ambulatory Visit: Admitting: Medical

## 2024-06-09 ENCOUNTER — Ambulatory Visit: Payer: Self-pay | Admitting: Medical

## 2024-06-09 VITALS — BP 138/100 | HR 78 | Temp 97.8°F | Resp 16 | Ht 70.0 in | Wt 213.6 lb

## 2024-06-09 DIAGNOSIS — E876 Hypokalemia: Secondary | ICD-10-CM | POA: Diagnosis not present

## 2024-06-09 DIAGNOSIS — L089 Local infection of the skin and subcutaneous tissue, unspecified: Secondary | ICD-10-CM | POA: Diagnosis not present

## 2024-06-09 LAB — COMPREHENSIVE METABOLIC PANEL WITH GFR
ALT: 19 U/L (ref 3–53)
AST: 20 U/L (ref 5–37)
Albumin: 4.3 g/dL (ref 3.5–5.2)
Alkaline Phosphatase: 77 U/L (ref 39–117)
BUN: 12 mg/dL (ref 6–23)
CO2: 28 meq/L (ref 19–32)
Calcium: 9.4 mg/dL (ref 8.4–10.5)
Chloride: 102 meq/L (ref 96–112)
Creatinine, Ser: 1.25 mg/dL (ref 0.40–1.50)
GFR: 69.27 mL/min
Glucose, Bld: 107 mg/dL — ABNORMAL HIGH (ref 70–99)
Potassium: 3.6 meq/L (ref 3.5–5.1)
Sodium: 139 meq/L (ref 135–145)
Total Bilirubin: 0.4 mg/dL (ref 0.2–1.2)
Total Protein: 7.3 g/dL (ref 6.0–8.3)

## 2024-06-09 MED ORDER — POTASSIUM CHLORIDE CRYS ER 10 MEQ PO TBCR
10.0000 meq | EXTENDED_RELEASE_TABLET | Freq: Every day | ORAL | 2 refills | Status: AC
  Filled 2024-06-09 – 2024-06-16 (×2): qty 30, 30d supply, fill #0

## 2024-06-09 MED ORDER — CEFTRIAXONE SODIUM 1 G IJ SOLR
1.0000 g | Freq: Once | INTRAMUSCULAR | Status: AC
  Administered 2024-06-09: 1 g via INTRAMUSCULAR

## 2024-06-09 MED ORDER — MUPIROCIN 2 % EX OINT
1.0000 | TOPICAL_OINTMENT | Freq: Two times a day (BID) | CUTANEOUS | 2 refills | Status: AC
  Filled 2024-06-09: qty 22, 11d supply, fill #0

## 2024-06-09 NOTE — Progress Notes (Signed)
 .  Subjective:    Patient ID: Brett Allen, male    DOB: 11/06/1975, 48 y.o.   MRN: [id number]  HPI  Brett Allen is a 48 year old male who presents for a follow-up after drainage of an abscess on the back of his neck.  Around 06-05-24 he developed a bump on the back of his neck that progressed to an abscess. It was drained at urgent care with more blood than pus expressed. Since then the site has been draining yellow fluid, and he changes the bandage regularly.  Below UC note in  Patient is a 48 year old gentleman present to the clinic today with reports of a firm, painful abscess to the back of his scalp, most notable this morning, 06/05/2024. Patient does report having several small bumps to this area transiently, after having a haircut several months ago. Patient reports area is extremely painful, slightly itchy, but not causing any other radiating discomfort, headache, nor fever. Patient with history of hypertension and diabetes for which he uses Ozempic .    1 Acute complicated illness or injury  Explanation of Medical Decision Making and variances from expected care: Patient is a pleasant 48 year old male that presents to the clinic today for evaluation of an abscess to the occipital region of scalp, that grew overnight, became significantly painful for him. There was surrounding evidence of folliculitis without vesicular-like pattern. I did inform patient I could perform an incision and drainage, however, did inform him that there is no guarantee this would remove all loculation, and this could theoretically recur, as well as risks of scarring, infection, and significant debilitation as a result. This being said, patient did opt for this. I did thoroughly clean and irrigate area, instilled 2% lidocaine  without epinephrine  for anesthesia, and did straight puncture with 11 blade. Scant pustulant drainage did present, however, predominant oozing bleeding occurred causing  serosanguineous appearance. Did instill quarter iodoform gauze packing after continued oozing blood, and bandage over top. I will give patient doxycycline  as coverage for bacterial folliculitis, as well as for this assumed entirely not drained abscess. I do recommend patient follow with PCP, dermatology, and possibly general surgery for no improvement. I also recommend he return in 3 days for removal of packing, or present to 1 of these clinicians for removal of packing and continued treatment. Patient does report being up-to-date with tetanus within the past 2 years. Patient understands signs and symptoms observe for requiring emergent reassessment.        He is taking doxycycline  twice daily and has about 8 to 10 pills left. The packing came out when he removed a bandage. He uses warm compresses on the area.  He has keloids, folliculitis, and recalls prior cellulitis, and he has a dermatology follow-up planned for keloids.  He is concerned about a recent low potassium of 3.4 mmol/L. He takes a low-dose potassium supplement daily and has increased dietary potassium with pistachios.    Review of Systems  Constitutional:  Negative for chills, fatigue and fever.  HENT:  Negative for congestion.   Respiratory:  Negative for chest tightness, shortness of breath and wheezing.   Cardiovascular:  Negative for chest pain and palpitations.  Gastrointestinal:  Negative for abdominal pain and nausea.  Musculoskeletal:  Negative for neck pain and neck stiffness.  Skin:        See hpi  Neurological:  Negative for dizziness, weakness and light-headedness.  Hematological:  Negative for adenopathy.  Psychiatric/Behavioral:  Negative for behavioral problems.  Past Medical History:  Diagnosis Date   Back pain    lower   Cellulitis    Diabetes mellitus without complication (HCC)    Hypertension    Sarcoidosis    right eye     Social History   Socioeconomic History   Marital status:  Married    Spouse name: Not on file   Number of children: Not on file   Years of education: Not on file   Highest education level: 12th grade  Occupational History   Not on file  Tobacco Use   Smoking status: Former    Types: Cigarettes   Smokeless tobacco: Never  Vaping Use   Vaping status: Never Used  Substance and Sexual Activity   Alcohol use: Not Currently    Comment: occassionally    Drug use: No   Sexual activity: Yes    Partners: Female    Comment: married 18 years.  Other Topics Concern   Not on file  Social History Narrative   Not on file   Social Drivers of Health   Tobacco Use: Low Risk  (06/05/2024)   Received from [facility]   Patient History    Smoking Tobacco Use: Never    Smokeless Tobacco Use: Never    Passive Exposure: Not on file  Recent Concern: Tobacco Use - Medium Risk (06/03/2024)   Patient History    Smoking Tobacco Use: Former    Smokeless Tobacco Use: Never    Passive Exposure: Not on file  Financial Resource Strain: Low Risk (04/21/2024)   Overall Financial Resource Strain (CARDIA)    Difficulty of Paying Living Expenses: Not hard at all  Food Insecurity: Food Insecurity Present (04/21/2024)   Epic    Worried About Programme Researcher, Broadcasting/film/video in the Last Year: Sometimes true    [employer] of Food in the Last Year: Often true  Transportation Needs: No Transportation Needs (04/21/2024)   Epic    Lack of Transportation (Medical): No    Lack of Transportation (Non-Medical): No  Physical Activity: Sufficiently Active (04/21/2024)   Exercise Vital Sign    Days of Exercise per Week: 3 days    Minutes of Exercise per Session: 50 min  Stress: Stress Concern Present (04/21/2024)   [employer] of Occupational Health - Occupational Stress Questionnaire    Feeling of Stress: To some extent  Social Connections: Moderately Integrated (04/21/2024)   Social Connection and Isolation Panel    Frequency of Communication with Friends and Family: Once a week     Frequency of Social Gatherings with Friends and Family: Once a week    Attends Religious Services: More than 4 times per year    Active Member of [employer] or Organizations: Yes    Attends Banker Meetings: More than 4 times per year    Marital Status: Married  Catering Manager Violence: Not At Risk (10/29/2022)   Humiliation, Afraid, Rape, and Kick questionnaire    Fear of Current or Ex-Partner: No    Emotionally Abused: No    Physically Abused: No    Sexually Abused: No  Depression (PHQ2-9): Low Risk (04/22/2024)   Depression (PHQ2-9)    PHQ-2 Score: 4  Alcohol Screen: Low Risk (04/21/2024)   Alcohol Screen    Last Alcohol Screening Score (AUDIT): 0  Housing: Unknown (04/21/2024)   Epic    Unable to Pay for Housing in the Last Year: No    Number of Times Moved in the Last Year: Not on file  Homeless in the Last Year: No  Utilities: Low Risk (04/02/2023)   Received from [facility]   Utilities    In the past 12 months has the electric, gas, oil, or water company threatened to shut off services in your home? : No  Health Literacy: Not on file    Past Surgical History:  Procedure Laterality Date   left wrsit surgery Left    WRIST FRACTURE SURGERY Left 2017    Family History  Problem Relation Age of Onset   Hypertension Mother    Diabetes Mother    Hypertension Brother     Allergies[1]  Medications Ordered Prior to Encounter[2]  BP (!) 138/100   Pulse 78   Temp 97.8 F (36.6 C) (Oral)   Resp 16   Ht 5' 10 (1.778 m)   Wt 213 lb 9.6 oz (96.9 kg)   SpO2 98%   BMI 30.65 kg/m        Objective:   Physical Exam  General Mental Status- Alert. General Appearance- Not in acute distress.   Skin Base of occiptal area/neck junction flattened out area with small incision mark and dry yellow dc. No fluctuance. Surrounding area mild indurated with also small keloid like scars in area  Neck No JVD.   Chest and Lung Exam Auscultation: Breath  Sounds:-CTA  Cardiovascular Auscultation:Rythm- RRR Murmurs & Other Heart Sounds:Auscultation of the heart reveals- No Murmurs.  Abdomen Inspection:-Inspeection Normal. Palpation/Percussion:Note:No mass. Palpation and Percussion of the abdomen reveal- Non Tender, Non Distended + BS, no rebound or guarding.   Neurologic Cranial Nerve exam:- CN III-XII intact(No nystagmus), symmetric smile. Strength:- 5/5 equal and symmetric strength both upper and lower extremities.       Assessment & Plan:   Neck abscess(formerly in UC)/ now skin infection and folliculitis(no entirely clear abscess persists?) Recent neck abscess drained, currently flattened with scant yellow drainage. With some chronic keloids as well.  - Ordered culture of neck abscess. - Administered im Rocephin  1 gram injection. - Continue doxycycline . - Apply topical antibiotic (mupirocin ) to affected area. - Approved same day appointment on December 31st as follow up. If area worsens and abscess reoccurs then be seen in UC and ED over holiday coming up. - Consider referral to general surgeon as well but not indicated presently.  Keloids back of neck/scalp -referral to dermatologist pending. -may need to re-refer as current appointment in summer. Glad that you are on cancellation list.  Hypokalemia Potassium level previously low at 3.4. Advised to consume potassium-rich foods like pistachios. - Checked potassium level today. - Continue low dose potassium daily.  Follow up 06-18-24 morning or sooner if needed    [1] No Known Allergies [2]  Current Outpatient Medications on File Prior to Visit  Medication Sig Dispense Refill   amLODipine  (NORVASC ) 10 MG tablet Take 1 tablet (10 mg total) by mouth daily. 90 tablet 0   buPROPion (WELLBUTRIN XL) 150 MG 24 hr tablet Take 150 mg by mouth every morning.     cyclobenzaprine  (FLEXERIL ) 10 MG tablet Take 1 tablet (10 mg total) by mouth at bedtime as needed. 3 tablet 0    doxycycline  (VIBRAMYCIN ) 100 MG capsule Take 1 capsule (100 mg total) by mouth in the morning and 1 capsule (100 mg total) in the evening. Do all this for 7 days. Take with at least 8 ounces (large glass) of water, do not lie down for 30 minutes after. 14 capsule 0   ibuprofen  (ADVIL ) 800 MG tablet Take 1 tablet (  800 mg total) by mouth every 8 (eight) hours as needed. 30 tablet 1   potassium chloride  (KLOR-CON  M) 10 MEQ tablet Take 1 tablet (10 mEq total) by mouth daily. 30 tablet 0   Semaglutide ,0.25 or 0.5MG /DOS, (OZEMPIC , 0.25 OR 0.5 MG/DOSE,) 2 MG/3ML SOPN Inject 0.25 mg into the skin once a week. 3 mL 0   traMADol  (ULTRAM ) 50 MG tablet Take 1 tablet (50 mg total) by mouth at bedtime as needed for pain. 30 tablet 0   valsartan  (DIOVAN ) 320 MG tablet Take 1 tablet (320 mg total) by mouth daily. 90 tablet 0   No current facility-administered medications on file prior to visit.

## 2024-06-09 NOTE — Patient Instructions (Addendum)
 Neck abscess(formerly in UC)/ now skin infection and folliculitis(no entirely clear abscess persists?) Recent neck abscess drained, currently flattened with scant yellow drainage. With some chronic keloids as well.  - Ordered culture of neck abscess. - Administered im Rocephin  1 gram injection. - Continue doxycycline . - Apply topical antibiotic (mupirocin ) to affected area. - Approved same day appointment on December 31st as follow up. If area worsens and abscess reoccurs then be seen in UC and ED over holiday coming up. - Consider referral to general surgeon as well but not indicated presently.  Keloids back of neck/scalp -referral to dermatologist pending. -may need to re-refer as current appointment in summer. Glad that you are on cancellation list.  Hypokalemia Potassium level previously low at 3.4. Advised to consume potassium-rich foods like pistachios. - Checked potassium level today. - Continue low dose potassium daily.  Follow up 06-18-24 morning or sooner if needed

## 2024-06-09 NOTE — Addendum Note (Signed)
 Addended by: DORINA DALLAS HERO on: 06/09/2024 01:02 PM   Modules accepted: Orders

## 2024-06-10 ENCOUNTER — Ambulatory Visit

## 2024-06-10 DIAGNOSIS — R2689 Other abnormalities of gait and mobility: Secondary | ICD-10-CM

## 2024-06-10 DIAGNOSIS — R293 Abnormal posture: Secondary | ICD-10-CM

## 2024-06-10 DIAGNOSIS — M542 Cervicalgia: Secondary | ICD-10-CM

## 2024-06-10 DIAGNOSIS — M6281 Muscle weakness (generalized): Secondary | ICD-10-CM

## 2024-06-10 DIAGNOSIS — G8929 Other chronic pain: Secondary | ICD-10-CM

## 2024-06-10 NOTE — Therapy (Signed)
 " OUTPATIENT PHYSICAL THERAPY TREATMENT   Patient Name: Brett Allen MRN: [id number] DOB:[birth year], 48 y.o., male Today's Date: 06/10/2024   END OF SESSION:  PT End of Session - 06/10/24 1108     Visit Number 5    Date for Recertification  07/08/24    Authorization Type [employer]    Authorization Time Period 05/13/24 - 08/11/24    Authorization - Visit Number 5    Authorization - Number of Visits 14    PT Start Time 1019    PT Stop Time 1115    PT Time Calculation (min) 56 min    Activity Tolerance Patient tolerated treatment well    Behavior During Therapy WFL for tasks assessed/performed              Past Medical History:  Diagnosis Date   Back pain    lower   Cellulitis    Diabetes mellitus without complication (HCC)    Hypertension    Sarcoidosis    right eye   Past Surgical History:  Procedure Laterality Date   left wrsit surgery Left    WRIST FRACTURE SURGERY Left 2017   Patient Active Problem List   Diagnosis Date Noted   Cellulitis 10/27/2022   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   Chronic bilateral low back pain without sciatica 07/23/2015   Hyperlipidemia 07/09/2015    PCP: Dorina Loving, PA-C   REFERRING PROVIDER: Gillie Duncans, MD   REFERRING DIAG: M54.2 (ICD-10-CM) - Cervicalgia  Lumbar & Cervical pain  THERAPY DIAG:  Cervicalgia  Chronic right-sided low back pain with right-sided sciatica  Muscle weakness (generalized)  Abnormal posture  Other abnormalities of gait and mobility  RATIONALE FOR EVALUATION AND TREATMENT: Rehabilitation  ONSET DATE: 04/27/24 - Neck pain;  Chronic - Lumbar pain  NEXT MD VISIT: 05/19/24   SUBJECTIVE:                                                                                                                                                                                                         SUBJECTIVE STATEMENT: Stiff today  EVAL: Pt reports continued limitations  in R shoulder since his surgery on 05/15/23.  He reports he reinjured his shoulder during work hardening/conditioning while preparing to return to work.  He states he has been given a 15% disability because of this which has kept him out of work.  His neck pain has been bad for about a 1 month, triggered as he went to brush his teeth one morning.  Pain comes and goes but  throbs all day long when present, limiting his ability to turn his head and chew.  Low back pain has been chronic for 17+ years (since moving to [place] in 2008).  Has h/o chiropractor work for his back - seen 3-4x in past month with ~3 visits remaining.  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  Feels like he is crooked and leaning forward when he walks.   PAIN: Are you having pain? Yes: NPRS scale: always 7/10  Pain location: midline low back to R, electric shock down R LE to knee and ankle starting ~2 months ago (trigger by prolonged sitting and standing)  Pain description: stabbing, locks up at times  Aggravating factors: prolonged sitting or standing, rising from bed in the morning  Relieving factors: meds take the edge off, heat, chiropractor - nothing really lasts   Are you having pain? Yes: NPRS scale: 7/10  Pain location: L lateral neck & upper shoulder  Pain description: throbbing on a normal day, sharp stabbing on bad day with massive headache  Aggravating factors: turning head to L, lying in awkward positions (esp on L side)   Relieving factors: heat, compression, meds (flexeril  at night, ibuprofen )   Are you having pain? Yes: NPRS scale: 5/10  Pain location: R anterior shoulder  Pain description: stinging, crunching, occasional numbness, TTP, throbbing  Aggravating factors: end range ER, lifting, sports  Relieving factors: ice, ibuprofen    PERTINENT HISTORY:  R shoulder surgery 05/15/23, chronic R  shoulder dislocation/subluxation, excision of neck tumor 04/02/23, DM, HTN, sarcoidosis, chronic LBP    Lumbar fusion surgery scheduled for 07/02/2024  PRECAUTIONS: None  HAND DOMINANCE: Right  RED FLAGS: None  WEIGHT BEARING RESTRICTIONS: No  FALLS:  Has patient fallen in last 6 months? No  LIVING ENVIRONMENT: Lives with: lives with their family Lives in: House Stairs: Yes: Internal: 10-12 steps; on right going up and External: 1 steps; none Has following equipment at home: None  OCCUPATION: Unemployed  PLOF: Independent with gait, Independent with transfers, Needs assistance with ADLs, and Leisure: walking; play video games, play basketball - currently unable  PATIENT GOALS: Some type of relief of my pain.    OBJECTIVE:   DIAGNOSTIC FINDINGS:  Neck x-ray unavailable  05/06/24 - MRI LUMBAR SPINE CLINICAL HISTORY: 48 year old male with spondylolisthesis, lumbar region.   FINDINGS:   BONES AND ALIGNMENT: Normal lumbar segmentation on the comparison radiographs. Stable lordosis since 2023. Mild degenerative retrolisthesis from L3-L4 through L5-S1. Normal background bone marrow signal. Chronic degenerative endplate marrow signal changes at L3-L4 and L4-L5, with superimposed degenerative appearing left lateral endplate marrow edema (left greater than right) at L3-L4 (series 6 image 11). No associated paraspinal soft tissue inflammation. No other marrow edema.   SPINAL CORD: Normal conus medullaris at L1-L2. No signal abnormality in the visible lower thoracic spinal cord or conus.   SOFT TISSUES: No paraspinal mass.   DEGENERATIVE:   Visible lower thoracic spine through L2-L3 are negative. L3-L4: Chronic severe disc space loss. Circumferential disc osteophyte complex. Degenerative appearing endplate marrow edema. Mild to moderate facet and ligamentum flavum hypertrophy. No spinal or lateral recess stenosis. Mild to moderate bilateral L3 neural foraminal  stenosis appears stable. L4-L5: Similar advanced chronic disc degeneration and disc space loss. Circumferential disc bulge with mild endplate spurring. Mild posterior element hypertrophy. No spinal or lateral recess stenosis. Moderate left and mild right L4 neural foraminal stenosis (  series 5 image 11 on the left). This level appears stable. L5-S1: Mild chronic retrolisthesis with better preserved disc height and disc signal. Mild circumferential disc bulge. Mild to moderate facet hypertrophy. No spinal or lateral recess stenosis. Mild to moderate bilateral L5 neural foraminal stenosis. This level is stable.   IMPRESSION: 1. Very age-advanced lumbar disc and endplate degeneration at L3-L4 and L4-L5, with superimposed mild degenerative-appearing endplate marrow edema at the former. Mild degenerative retrolisthesis at those levels and L5-S1. 2. But no significant lumbar spinal or lateral recess stenosis. And up to moderate associated neural foraminal stenosis at those levels does not appear significantly changed from a 2023 MRI.Lumbar MRI pending  04/22/24 - DG LUMBAR SPINE - 2-3 VIEW CLINICAL DATA:  Lumbar radicular pain on the right   FINDINGS: There is no evidence of lumbar spine fracture. Degenerative changes of the spine predominantly involving the L3-L4 and, L4-L5 and L5-S1 with associated decreased intervertebral disc space and facet hypertrophy. Grade 1 anterolisthesis of L4-L5.   Soft tissues are unremarkable.   IMPRESSION: Degenerative disc disease involving L3- L4 through L5-S1.   Grade 1 anterolisthesis of L4-L5.  01/07/24 - MRI RIGHT SHOULDER WITHOUT CONTRAST, 01/07/2024 12:08 PM  INDICATION: Pain in right shoulder \ M25.511 Pain in right shoulder ,   FINDINGS:   . Acromioclavicular joint: Mild degenerative changes. No effusion or malalignment.  .  Acromion: No subacromial spurring. Mild subacromial subdeltoid bursitis.  .  Supraspinatus tendon: Mild tendinosis.  .   Infraspinatus tendon: Mild tendinosis.  .  Subscapularis tendon: Intact.  .  Teres minor tendon: Intact.  .  Long head of biceps: Mild tendinosis above the bicipital groove.  .  Glenohumeral joint: No effusion or malalignment.  .  Labrum: Postsurgical changes related to anterior inferior labral repair/Bankart. Linear T2 signal within the posterior inferior labrum [series 3 image 14-15, series 7 image 16] favors to represent postsurgical changes rather than a labral tear, for correlation with operative notes.  .  Bones: Bony remodeling within the anterior-inferior glenoid without edema like signal, likely a sequelae of anterior shoulder dislocation [series 8 image 16, series 4 image 18]. Remote Hill-Sachs fracture. Normal marrow signal. No fracture, neoplasm, or avascular necrosis.  .  Additional comments: No muscle atrophy.   IMPRESSION:  1. Remodeling of the anterior inferior glenoid likely related to remote bony Bankart fracture and remote Hill-Sachs fracture, likely a sequelae of prior anterior shoulder dislocation.  2.  Postsurgical changes related to labral repair.  3.  Linear T2 signal within the posterior inferior labrum, favors to represent postsurgical changes, for correlation with operative notes.  4.  Mild tendinosis supraspinatus, infraspinatus and long head of biceps.  5.  Mild subacromial/subdeltoid bursitis.  6.  Mild AC joint degenerative changes.   04/07/22 - MRI LUMBAR SPINE WITHOUT CONTRAST  CLINICAL DATA:  Initial evaluation for lumbar degenerative disc disease.   FINDINGS:  Segmentation: Standard. Lowest well-formed disc space labeled the  L5-S1 level.   Alignment: 3 mm degenerative retrolisthesis of L4 on L5, with trace  2 mm retrolisthesis of L3 on L4. Alignment otherwise normal with  preservation of the normal lumbar lordosis.   Vertebrae: Vertebral body height maintained without acute or chronic  fracture. Bone marrow signal intensity within normal limits. No   worrisome osseous lesions. Discogenic reactive endplate changes  present about the L3-4 and L4-5 interspaces. No other abnormal  marrow edema.   Conus medullaris and cauda equina: Conus extends to the L2 level.  Conus and  cauda equina appear normal.   Paraspinal and other soft tissues: Unremarkable.   Disc levels:   L1-2:  Unremarkable.   L2-3:  Unremarkable.   L3-4: Degenerative intervertebral disc space narrowing with  circumferential disc bulge and disc desiccation. Associated reactive  endplate change with marginal endplate osteophytic spurring. Mild  facet hypertrophy. No significant spinal stenosis. Mild bilateral L3  foraminal narrowing. No frank impingement.   L4-5: Degenerative with abscesses narrowing with diffuse disc bulge  and disc desiccation. Associated reactive endplate spurring. Mild  facet and ligament flavum hypertrophy. No significant spinal  stenosis. Mild right with mild to moderate left L4 foraminal  stenosis.   L5-S1: Mild diffuse disc bulge with endplate spurring. Mild  bilateral facet hypertrophy. No spinal stenosis. Mild bilateral L5  foraminal narrowing. No frank impingement.   IMPRESSION:  1. Lower lumbar degenerative disc disease at L3-4 through L5-S1 with  resultant mild to moderate bilateral L3 through L5 foraminal  stenosis as above. No significant spinal stenosis or overt neural  impingement.  2. Mild bilateral facet hypertrophy at L3-4 through L5-S1.   PATIENT SURVEYS:  Oswestry Low Back Pain Disability Questionnaire:  MODIFIED OSWESTRY DISABILITY SCALE Date:  05/13/24   Pain intensity 5 =  Pain medication has no effect on my pain.  2. Personal care (washing, dressing, etc.) 3 =  I need help, but I am able to manage most of my personal care.  3. Lifting 2 = Pain prevents me from lifting heavy weights off the floor,but I can manage if the weights are conveniently positioned (e.g. on a table)  4. Walking 2 =  Pain prevents me from  walking more than  mile.  5. Sitting 3 =  Pain prevents me from sitting more than  hour.  6. Standing 3 =  Pain prevents me from standing more than 1/2 hour.  7. Sleeping 4 =  Even when I take pain medication, I sleep less than 2 hour  8. Social Life 2 = Pain prevents me from participating in more energetic activities (eg. sports, dancing).  9. Traveling 2 =  My pain restricts my travel over 2 hours.  10. Employment/ Homemaking 3 = Pain prevents me from doing anything but light duties.  Total 29/50  % Disability 58.0 % - Severe   Interpretation of scores: Score Category Description  0-20% Minimal Disability The patient can cope with most living activities. Usually no treatment is indicated apart from advice on lifting, sitting and exercise  21-40% Moderate Disability The patient experiences more pain and difficulty with sitting, lifting and standing. Travel and social life are more difficult and they may be disabled from work. Personal care, sexual activity and sleeping are not grossly affected, and the patient can usually be managed by conservative means  41-60% Severe Disability Pain remains the main problem in this group, but activities of daily living are affected. These patients require a detailed investigation  61-80% Crippled Back pain impinges on all aspects of the patients life. Positive intervention is required  81-100% Bed-bound These patients are either bed-bound or exaggerating their symptoms  Bluford FORBES Zoe DELENA Karon DELENA, et al. Surgery versus conservative management of stable thoracolumbar fracture: the PRESTO feasibility RCT. [place] ([place]): [employer]; 2021 Nov. [facility] Technology Assessment, No. 25.62.) Appendix 3, Oswestry Disability Index category descriptors. Available from: Findjewelers.cz  Minimally Clinically Important Difference (MCID) = 12.8%    NDI:  NECK DISABILITY INDEX Date:  05/13/24   Pain intensity 4 = The pain  is very severe at the moment  2. Personal care (washing, dressing, etc.) 3 =  I need some help but can manage most of my personal care  3. Lifting 3 = Pain prevents me from lifting heavy weights but I can manage light to medium   weights if they are conveniently positioned  4. Reading 4 =  I can hardly read at all because of severe pain in my neck  5. Headaches 3 = I have moderate headaches, which come frequently  6. Concentration 1 =  I can concentrate fully when I want to with slight difficulty   7. Work 3 =  I cannot do my usual work  8. Driving 3 = I can't drive my car as long as I want because of moderate pain in my neck  9. Sleeping 4 = My sleep is greatly disturbed (3-5 hrs sleepless)   10. Recreation 4 =  I can hardly do any recreation activities because of pain in my neck  Total 32/50  % Disability 64.0 % - Severe   Minimum Detectable Change (90% confidence): 5 points or 10% points  SCREENING FOR RED FLAGS: Bowel or bladder incontinence: No Spinal tumors: No Cauda equina syndrome: No Compression fracture: No Abdominal aneurysm: No  COGNITION: Overall cognitive status: Within functional limits for tasks assessed     SENSATION: WFL  POSTURE:  rounded shoulders, forward head, decreased lumbar lordosis, flexed trunk , and weight shift left  PALPATION: TTP with increased muscle tension and TPs in L UT and LS TTP in R lumbar paraspinals and glutes, R>L piriformis  CERVICAL ROM:   Active ROM Eval  Flexion 31  Extension 22 - tight  Right lateral flexion 23  Left lateral flexion 20 p!  Right rotation 36  Left rotation 28 p!    (Blank rows = not tested)  UPPER EXTREMITY ROM:  Active ROM Right eval Left eval  Shoulder flexion 120 149  Shoulder extension 34 36  Shoulder abduction 139 144  Shoulder adduction    Shoulder internal rotation L1 L2  Shoulder external rotation T3 T2    (Blank rows = not tested)  UPPER EXTREMITY MMT:  MMT Right eval Left eval   Shoulder flexion 4+ 4  Shoulder extension 5 5  Shoulder abduction 4+ 4  Shoulder adduction    Shoulder internal rotation 5 5  Shoulder external rotation 4+ 4+  Middle trapezius 4 4  Lower trapezius 3- 3+  (Blank rows = not tested)  LUMBAR ROM:   Active  Eval  Flexion 90% limited  Extension 90% limited  Right lateral flexion Distal femur   Left lateral flexion Lateral femoral condyle   Right rotation 60% limited  Left rotation 60% limited    (Blank rows = not tested)  MUSCLE LENGTH: Hamstrings: mod tight B ITB: mod tight B Piriformis: mod/severe tight B Hip flexors: mod tight B Quads: mod tight B Heelcord:   LOWER EXTREMITY ROM:    Grossly WFL other than limitations due to tightness as above  LOWER EXTREMITY MMT:    MMT Right eval Left eval  Hip flexion 3 3+  Hip extension 2- 2-  Hip abduction 2 2+  Hip adduction 2 2  Hip internal rotation 4 3+  Hip external rotation 4- 4-  Knee flexion 4 4-  Knee extension 4 4  Ankle dorsiflexion 4+ 4  Ankle plantarflexion    Ankle inversion    Ankle eversion      (Blank rows = not  tested)  CERVICAL SPECIAL TESTS:  Spurling's test: Negative and Distraction test: Negative  LUMBAR SPECIAL TESTS:  Straight leg raise test: Negative and Slump test: Negative  FUNCTIONAL TESTS:  5 times sit to stand: unable w/o B UE assist; 27.56 sec with B UE assist   TODAY'S TREATMENT:  06/10/24 Rec Bike L2x83min Bird dog orange pball x 10 Bent over orange pball shoulder extension IASTM with foam roll to low back and glutes Seated lumbar flexion stretch with orange pball Seated rows 15lb x 10 BUE Moist heat to low back in sitting x 15 min  06/03/24  THERAPEUTIC EXERCISE: To improve strength, endurance, ROM, and flexibility.  Demonstration, verbal and tactile cues throughout for technique.  Rec Bike - L2 x 6' Standing lumbar extension with forearms on wall 10 x 5  MANUAL THERAPY: To promote normalized muscle tension, improved  flexibility, and reduced pain utilizing connective tissue massage, therapeutic massage, manual TP therapy, and percussion massage with massage gun.  STM/DTM and percussion massage to R>L UT, LS and scalenes  SELF CARE: Provided education on post-surgical precautions and to facilitate performance of basic household cleaning/chores. Provided education in proper posture and body mechanics for typical daily positioning and household chores to minimize strain on low back and neck, emphasizing BLT precautions for the immediate post-op period.  NEUROMUSCULAR RE-EDUCATION: To improve coordination, kinesthesia, posture, and proprioception.  Quadruped over orange Pball: Alt LE/hip extension x 10 - VC & TC to maintain neutral spine avoiding trunk rotation Alt UE raises x 10   05/27/24  THERAPEUTIC EXERCISE: To improve strength, endurance, ROM, and flexibility.  Demonstration, verbal and tactile cues throughout for technique.  UBE - L2.0 x 3' each fwd & back Supine R/L HS stretch with strap 1 x 30 with opp LE straight, 3 x 30 with opp knee flexed (preferred) Supine SKTC 3 x 30 bil Hooklying LTR 10 x 5 Hooklying TrA progressing to PPT 10 x 5  NEUROMUSCULAR RE-EDUCATION: To improve coordination, kinesthesia, and posture. Hooklying PPT + GTB B scap retraction + shoulder horizontal ABD 10 x 3-5 GTB alt hip ABD/ER bent-knee fallouts 10 x 3-5 , 2 sets - cues to control band on return to neutral position Attempted bridge + GTB hip ABD isometric but deferred due to increased pain even with limited lift GTB hip flexion march 2 x 10 Hip ADD isometric ball squeeze 10 x 5, 2 sets  SELF CARE: Provided education on post-surgical precautions and to increase independence with ADLs. Provided initial introduction to BLT precautions and review log rolling and sidelying to sit transitions to reduce low back strain. Reviewed recommended sleeping positions, avoiding prone due to strain on neck and suggesting  sidelying with pillow between knees to promote neutral spine alignment.   05/20/24 THERAPEUTIC EXERCISE: To improve strength, endurance, ROM, and flexibility.  UBE L2.0 3 min fwd/ 3 min back Bike L2x71min Seated UT and levator stretches reviewed Seated lumbar flexion stretch touching floor 2x30' Supine LTR both ways 10x5' Supine SKTC R/L 2 x 30' Supine HS stretch 2x30' BLE w/ strap Seated ab sets orange pball 10x5'   05/13/2024 - Eval SELF CARE:  Reviewed eval findings and role of PT in addressing identified deficits as well as instruction in initial HEP (see below).  Patient inquiring about use of estim which is not covered by his [employer], therefore provided information on a home TENS unit for self-purchase.   PATIENT EDUCATION:  Education details: HEP progression - core/lumbopelvic strengthening  Person educated: Patient  Education method: Explanation, Demonstration, Verbal cues, Tactile cues, and MedBridgeGO app updated Education comprehension: verbalized understanding, returned demonstration, verbal cues required, tactile cues required, and needs further education  HOME EXERCISE PROGRAM: *Pt using MedBridgeGO app.  Access Code: [email or web address] URL: [email or web address] Date: 06/03/2024 Prepared by: Elijah Hidden  Exercises - Seated Gentle Upper Trapezius Stretch (Mirrored)  - 2-3 x daily - 7 x weekly - 3 reps - 30 sec hold - Gentle Levator Scapulae Stretch  - 2-3 x daily - 7 x weekly - 3 reps - 30 sec hold - Seated 3 Way Exercise Ball Roll Out Stretch  - 2-3 x daily - 7 x weekly - 10 reps - 5 sec hold - Supine Lower Trunk Rotation  - 2-3 x daily - 7 x weekly - 10 reps - 5-10 sec hold - Hooklying Hamstring Stretch with Strap  - 2-3 x daily - 7 x weekly - 3 reps - 30 sec hold - Hooklying Single Knee to Chest Stretch  - 2-3 x daily - 7 x weekly - 3 reps - 30 sec hold - Supine Posterior Pelvic Tilt  - 1 x daily - 7 x weekly - 2 sets - 10 reps - 5 sec hold -  Supine Hip Adduction Isometric with Ball  - 1 x daily - 7 x weekly - 2 sets - 10 reps - 5 sec hold - Supine Shoulder Horizontal Abduction with Resistance  - 1 x daily - 7 x weekly - 2 sets - 10 reps - 3 sec hold - Hooklying Single Leg Bent Knee Fallouts with Resistance  - 1 x daily - 7 x weekly - 2 sets - 10 reps - 3 sec hold - Supine March with Resistance Band  - 1 x daily - 7 x weekly - 2 sets - 10 reps - 2-3 sec hold hold  Patient Education - TENS Therapy - TENS UNIT - AUVON Dual Channel TENS Unit - Post-Op Back Precautions - Posture and Body Mechanics   ASSESSMENT:  CLINICAL IMPRESSION: Pt in more pain today from helping his wife build furniture in the house until 3am. He was very guarded today, limited with exercises. He did report some relief with the manual therapy today. Lumbar ROM doesn't see much change today.  Brett Allen will benefit from continued skilled PT to address ongoing cervical and lumbar ROM/flexibility, postural, core and proximal UE/LE strength deficits to improve mobility and activity tolerance with decreased pain interference.    EVAL: Brett Allen is a 48 y.o. male who was referred to physical therapy for evaluation and treatment for acute L-sided neck pain and chronic R-sided lumbar pain with intermittent R LE radicular pain, numbness and tingling.  He also reports chronic R shoulder pain and limited ROM following surgery ~1 yr ago s/p chronic dislocations.   Patient reports onset of L-sided neck pain beginning ~1 month ago while brushing his teeth.  Neck pain is intermittent, worse with sleeping positions and turning his head.  Patient reports onset of R-sided low back pain 17+ years ago, exacerbated 2-3 yrs ago with a job requiring repetitive bending, lifting and twisting. Low back pain is chronic and constant, with electric shock down R LE to knee and ankle starting ~2 months ago, worse with prolonged sitting or standing.  Patient has deficits in cervical and lumber  ROM, B LE flexibility, core/postural and B UE/LE strength, abnormal posture, and TTP with abnormal muscle tension which are interfering with ADLs and are impacting quality of life.  On  NDI patient scored 32/50 demonstrating 64% or severe disability.  On Modified Oswestry patient scored 29/50 demonstrating 58% or severe disability.  Kyle will benefit from skilled PT to address above deficits to improve mobility and activity tolerance with decreased pain interference.  OBJECTIVE IMPAIRMENTS: Abnormal gait, decreased activity tolerance, decreased endurance, decreased knowledge of condition, decreased mobility, difficulty walking, decreased ROM, decreased strength, hypomobility, increased fascial restrictions, impaired perceived functional ability, increased muscle spasms, impaired flexibility, impaired sensation, impaired UE functional use, improper body mechanics, postural dysfunction, and pain.   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   REHAB POTENTIAL: Good  CLINICAL DECISION MAKING: Unstable/unpredictable  EVALUATION COMPLEXITY: High   GOALS: Goals reviewed with patient? Yes  SHORT TERM GOALS: Target date: 06/10/2024  Patient will be independent with initial HEP to improve outcomes and carryover.  Baseline: HEP initiated on eval Goal status: MET - 05/27/24  2.  Patient will report 25% improvement in neck and low  back pain to improve QOL. Baseline: Neck = 5/10 & Low back = 7/10 on eval, both up to 10/10 at worst Goal status: IN PROGRESS - 06/03/24 - pain essentially unchanged,except neck pain slightly worse (likely triggered by sleeping in prone)  3.  Patient will demonstrate improved posture to decrease muscle imbalance. Baseline: forward head, rounded shoulders, decreased lumbar lordosis, flexed trunk, and weight shift left Goal status: IN PROGRESS -06/03/24 - improving posture in standing but still with tendency for fwd head, rounded shoulders and increased trunk flexion in sitting  LONG TERM GOALS: Target date: 07/08/2024  Patient will be independent with ongoing/advanced HEP for self-management at home.  Baseline:  Goal status: INITIAL  2.  Patient will report 50-75% improvement in neck and low back pain to improve QOL.  Baseline: Neck = 5/10 & Low back = 7/10 on eval, both up to 10/10 at worst Goal status: INITIAL  3.  Patient to demonstrate ability to achieve and maintain good spinal alignment and body mechanics needed for daily activities. Baseline:  Goal status: IN PROGRESS - 06/03/24 - education provided in proper posture and body mechanics for typical daily positioning and household chores to minimize strain on low back and neck as well as post-op BLT precautions   4.  Patient will demonstrate functional pain free cervical ROM for safety with driving.  Baseline: Refer to above cervical ROM table Goal status: INITIAL  5.  Patient will demonstrate functional pain free lumbar ROM to perform ADLs.   Baseline: Refer to above lumbar ROM table Goal status: INITIAL  7.  Patient will demonstrate improved functional strength as demonstrated by improved B postural and UE/LE strength to >/= 4 to 4+/5. Baseline: Refer to above UE/LE MMT tables Goal status: INITIAL  6.  Patient will report </= 54% on NDI (MCID = 10%) to demonstrate improved functional ability.  Baseline: 32 / 50 = 64.0  % Goal status: INITIAL   7.  Patient will report </= 46% on Modified Oswestry (MCID = 12%) to demonstrate improved functional ability.  Baseline: 29 / 50 = 58.0 % Goal status: INITIAL  8.  Patient to report ability  to perform ADLs, household, and work-related tasks without need for assistance of his wife. Baseline: needs assistance of wife with washing his back and most household chores Goal status: INITIAL   9.  Patient will tolerate >30 min of sitting or standing w/o increase in pain or radicular symptoms to allow for increased ease of ADL performance. Baseline: Back pain prevents sitting or standing for >30 minutes Goal status: INITIAL  10.  Patient will report centralization of R LE radicular symptoms.  Baseline: electric shock down R LE to knee and ankle Goal status: INITIAL   PLAN:  PT FREQUENCY: 2x/week  PT DURATION: 8 weeks  PLANNED INTERVENTIONS: 02835- PT Re-evaluation, 97750- Physical Performance Testing, 97110-Therapeutic exercises, 97530- Therapeutic activity, 97112- Neuromuscular re-education, 97535- Self Care, 02859- Manual therapy, [phone]- Gait training, [phone]- Aquatic Therapy, [phone]- Ultrasound, [phone] (1-2 muscles), [id number] (3+ muscles)- Dry Needling, Patient/Family education, Balance training, Stair training, Taping, Joint mobilization, Spinal mobilization, Cryotherapy, and Moist heat  PLAN FOR NEXT SESSION: gently progress cervical and lumbar ROM/stretching; review & progress HEP PRN; MT +/- TPDN to address TPs/abnormal muscle tension in L UT, lumbar paraspinals, glues and piriformis; review posture & body mechanics education and BLT precautions PRN    Jaquell Seddon L Johncarlos Holtsclaw, PTA 06/10/2024, 11:08 AM  "

## 2024-06-13 LAB — CULTURE,AEROBIC BACTERIA WITH GRAM STAIN
MICRO NUMBER:: 17385737
SPECIMEN QUALITY:: ADEQUATE

## 2024-06-16 ENCOUNTER — Ambulatory Visit: Admitting: Physical Therapy

## 2024-06-16 ENCOUNTER — Other Ambulatory Visit (HOSPITAL_BASED_OUTPATIENT_CLINIC_OR_DEPARTMENT_OTHER): Payer: Self-pay

## 2024-06-16 ENCOUNTER — Encounter: Payer: Self-pay | Admitting: Physical Therapy

## 2024-06-16 DIAGNOSIS — M542 Cervicalgia: Secondary | ICD-10-CM

## 2024-06-16 DIAGNOSIS — R293 Abnormal posture: Secondary | ICD-10-CM

## 2024-06-16 DIAGNOSIS — M6281 Muscle weakness (generalized): Secondary | ICD-10-CM

## 2024-06-16 DIAGNOSIS — G8929 Other chronic pain: Secondary | ICD-10-CM

## 2024-06-16 DIAGNOSIS — R2689 Other abnormalities of gait and mobility: Secondary | ICD-10-CM

## 2024-06-16 NOTE — Therapy (Signed)
 " OUTPATIENT PHYSICAL THERAPY TREATMENT   Patient Name: Brett Allen MRN: [id number] DOB:31-May-1976, 48 y.o., male Today's Date: 06/16/2024   END OF SESSION:  PT End of Session - 06/16/24 0930     Visit Number 6    Date for Recertification  07/08/24    Authorization Type [employer]    Authorization Time Period 05/13/24 - 08/11/24    Authorization - Visit Number 6    Authorization - Number of Visits 14    PT Start Time 0930    PT Stop Time 1018    PT Time Calculation (min) 48 min    Activity Tolerance Patient tolerated treatment well    Behavior During Therapy WFL for tasks assessed/performed               Past Medical History:  Diagnosis Date   Back pain    lower   Cellulitis    Diabetes mellitus without complication (HCC)    Hypertension    Sarcoidosis    right eye   Past Surgical History:  Procedure Laterality Date   left wrsit surgery Left    WRIST FRACTURE SURGERY Left 2017   Patient Active Problem List   Diagnosis Date Noted   Cellulitis 10/27/2022   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   Chronic bilateral low back pain without sciatica 07/23/2015   Hyperlipidemia 07/09/2015    PCP: Dorina Loving, PA-C   REFERRING PROVIDER: Gillie Duncans, MD   REFERRING DIAG: M54.2 (ICD-10-CM) - Cervicalgia  Lumbar & Cervical pain  THERAPY DIAG:  Cervicalgia  Chronic right-sided low back pain with right-sided sciatica  Muscle weakness (generalized)  Abnormal posture  Other abnormalities of gait and mobility  RATIONALE FOR EVALUATION AND TREATMENT: Rehabilitation  ONSET DATE: 04/27/24 - Neck pain;  Chronic - Lumbar pain  NEXT MD VISIT: TBD   SUBJECTIVE:                                                                                                                                                                                                         SUBJECTIVE STATEMENT: Pt reports Medicaid denied his surgery so he is not sure  if his MD will want him to continue PT.  He received a LSO for after surgery but has been wearing it now - he feels like it is helping with his posture but has not done anything to help with his pain.  EVAL: Pt reports continued limitations in R shoulder since his surgery on 05/15/23.  He reports he reinjured his shoulder during work hardening/conditioning while  preparing to return to work.  He states he has been given a 15% disability because of this which has kept him out of work.  His neck pain has been bad for about a 1 month, triggered as he went to brush his teeth one morning.  Pain comes and goes but throbs all day long when present, limiting his ability to turn his head and chew.  Low back pain has been chronic for 17+ years (since moving to [place] in 2008).  Has h/o chiropractor work for his back - seen 3-4x in past month with ~3 visits remaining.  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  Feels like he is crooked and leaning forward when he walks.   PAIN: Are you having pain? Yes: NPRS scale: always 7/10  Pain location: midline low back to R, electric shock down R LE to knee and ankle starting ~2 months ago (trigger by prolonged sitting and standing)  Pain description: stabbing, locks up at times  Aggravating factors: prolonged sitting or standing, rising from bed in the morning  Relieving factors: meds take the edge off, heat, chiropractor - nothing really lasts   Are you having pain? Yes: NPRS scale: 7/10  Pain location: L lateral neck & upper shoulder  Pain description: throbbing on a normal day, sharp stabbing on bad day with massive headache  Aggravating factors: turning head to L, lying in awkward positions (esp on L side)   Relieving factors: heat, compression, meds (flexeril  at night, ibuprofen )   Are you having pain? Yes: NPRS scale: 5/10  Pain location: R anterior  shoulder  Pain description: stinging, crunching, occasional numbness, TTP, throbbing  Aggravating factors: end range ER, lifting, sports  Relieving factors: ice, ibuprofen    PERTINENT HISTORY:  R shoulder surgery 05/15/23, chronic R shoulder dislocation/subluxation, excision of neck tumor 04/02/23, DM, HTN, sarcoidosis, chronic LBP    Lumbar fusion surgery scheduled for 07/02/2024  PRECAUTIONS: None  HAND DOMINANCE: Right  RED FLAGS: None  WEIGHT BEARING RESTRICTIONS: No  FALLS:  Has patient fallen in last 6 months? No  LIVING ENVIRONMENT: Lives with: lives with their family Lives in: House Stairs: Yes: Internal: 10-12 steps; on right going up and External: 1 steps; none Has following equipment at home: None  OCCUPATION: Unemployed  PLOF: Independent with gait, Independent with transfers, Needs assistance with ADLs, and Leisure: walking; play video games, play basketball - currently unable  PATIENT GOALS: Some type of relief of my pain.    OBJECTIVE:   DIAGNOSTIC FINDINGS:  Neck x-ray unavailable  05/06/24 - MRI LUMBAR SPINE CLINICAL HISTORY: 48 year old male with spondylolisthesis, lumbar region.   FINDINGS:   BONES AND ALIGNMENT: Normal lumbar segmentation on the comparison radiographs. Stable lordosis since 2023. Mild degenerative retrolisthesis from L3-L4 through L5-S1. Normal background bone marrow signal. Chronic degenerative endplate marrow signal changes at L3-L4 and L4-L5, with superimposed degenerative appearing left lateral endplate marrow edema (left greater than right) at L3-L4 (series 6 image 11). No associated paraspinal soft tissue inflammation. No other marrow edema.   SPINAL CORD: Normal conus medullaris at L1-L2. No signal abnormality in the visible lower thoracic spinal cord or conus.   SOFT TISSUES: No paraspinal mass.   DEGENERATIVE:   Visible lower thoracic spine through L2-L3 are negative. L3-L4: Chronic severe disc space  loss. Circumferential disc osteophyte complex. Degenerative appearing endplate marrow edema. Mild to moderate facet and ligamentum flavum  hypertrophy. No spinal or lateral recess stenosis. Mild to moderate bilateral L3 neural foraminal stenosis appears stable. L4-L5: Similar advanced chronic disc degeneration and disc space loss. Circumferential disc bulge with mild endplate spurring. Mild posterior element hypertrophy. No spinal or lateral recess stenosis. Moderate left and mild right L4 neural foraminal stenosis (series 5 image 11 on the left). This level appears stable. L5-S1: Mild chronic retrolisthesis with better preserved disc height and disc signal. Mild circumferential disc bulge. Mild to moderate facet hypertrophy. No spinal or lateral recess stenosis. Mild to moderate bilateral L5 neural foraminal stenosis. This level is stable.   IMPRESSION: 1. Very age-advanced lumbar disc and endplate degeneration at L3-L4 and L4-L5, with superimposed mild degenerative-appearing endplate marrow edema at the former. Mild degenerative retrolisthesis at those levels and L5-S1. 2. But no significant lumbar spinal or lateral recess stenosis. And up to moderate associated neural foraminal stenosis at those levels does not appear significantly changed from a 2023 MRI.Lumbar MRI pending  04/22/24 - DG LUMBAR SPINE - 2-3 VIEW CLINICAL DATA:  Lumbar radicular pain on the right   FINDINGS: There is no evidence of lumbar spine fracture. Degenerative changes of the spine predominantly involving the L3-L4 and, L4-L5 and L5-S1 with associated decreased intervertebral disc space and facet hypertrophy. Grade 1 anterolisthesis of L4-L5.   Soft tissues are unremarkable.   IMPRESSION: Degenerative disc disease involving L3- L4 through L5-S1.   Grade 1 anterolisthesis of L4-L5.  01/07/24 - MRI RIGHT SHOULDER WITHOUT CONTRAST, 01/07/2024 12:08 PM  INDICATION: Pain in right shoulder \ M25.511 Pain in  right shoulder ,   FINDINGS:   . Acromioclavicular joint: Mild degenerative changes. No effusion or malalignment.  .  Acromion: No subacromial spurring. Mild subacromial subdeltoid bursitis.  .  Supraspinatus tendon: Mild tendinosis.  .  Infraspinatus tendon: Mild tendinosis.  .  Subscapularis tendon: Intact.  .  Teres minor tendon: Intact.  .  Long head of biceps: Mild tendinosis above the bicipital groove.  .  Glenohumeral joint: No effusion or malalignment.  .  Labrum: Postsurgical changes related to anterior inferior labral repair/Bankart. Linear T2 signal within the posterior inferior labrum [series 3 image 14-15, series 7 image 16] favors to represent postsurgical changes rather than a labral tear, for correlation with operative notes.  .  Bones: Bony remodeling within the anterior-inferior glenoid without edema like signal, likely a sequelae of anterior shoulder dislocation [series 8 image 16, series 4 image 18]. Remote Hill-Sachs fracture. Normal marrow signal. No fracture, neoplasm, or avascular necrosis.  .  Additional comments: No muscle atrophy.   IMPRESSION:  1. Remodeling of the anterior inferior glenoid likely related to remote bony Bankart fracture and remote Hill-Sachs fracture, likely a sequelae of prior anterior shoulder dislocation.  2.  Postsurgical changes related to labral repair.  3.  Linear T2 signal within the posterior inferior labrum, favors to represent postsurgical changes, for correlation with operative notes.  4.  Mild tendinosis supraspinatus, infraspinatus and long head of biceps.  5.  Mild subacromial/subdeltoid bursitis.  6.  Mild AC joint degenerative changes.   04/07/22 - MRI LUMBAR SPINE WITHOUT CONTRAST  CLINICAL DATA:  Initial evaluation for lumbar degenerative disc disease.   FINDINGS:  Segmentation: Standard. Lowest well-formed disc space labeled the  L5-S1 level.   Alignment: 3 mm degenerative retrolisthesis of L4 on L5, with trace  2 mm  retrolisthesis of L3 on L4. Alignment otherwise normal with  preservation of the normal lumbar lordosis.   Vertebrae: Vertebral body height  maintained without acute or chronic  fracture. Bone marrow signal intensity within normal limits. No  worrisome osseous lesions. Discogenic reactive endplate changes  present about the L3-4 and L4-5 interspaces. No other abnormal  marrow edema.   Conus medullaris and cauda equina: Conus extends to the L2 level.  Conus and cauda equina appear normal.   Paraspinal and other soft tissues: Unremarkable.   Disc levels:   L1-2:  Unremarkable.   L2-3:  Unremarkable.   L3-4: Degenerative intervertebral disc space narrowing with  circumferential disc bulge and disc desiccation. Associated reactive  endplate change with marginal endplate osteophytic spurring. Mild  facet hypertrophy. No significant spinal stenosis. Mild bilateral L3  foraminal narrowing. No frank impingement.   L4-5: Degenerative with abscesses narrowing with diffuse disc bulge  and disc desiccation. Associated reactive endplate spurring. Mild  facet and ligament flavum hypertrophy. No significant spinal  stenosis. Mild right with mild to moderate left L4 foraminal  stenosis.   L5-S1: Mild diffuse disc bulge with endplate spurring. Mild  bilateral facet hypertrophy. No spinal stenosis. Mild bilateral L5  foraminal narrowing. No frank impingement.   IMPRESSION:  1. Lower lumbar degenerative disc disease at L3-4 through L5-S1 with  resultant mild to moderate bilateral L3 through L5 foraminal  stenosis as above. No significant spinal stenosis or overt neural  impingement.  2. Mild bilateral facet hypertrophy at L3-4 through L5-S1.   PATIENT SURVEYS:  Oswestry Low Back Pain Disability Questionnaire:  MODIFIED OSWESTRY DISABILITY SCALE Date:  05/13/24   Pain intensity 5 =  Pain medication has no effect on my pain.  2. Personal care (washing, dressing, etc.) 3 =  I need help, but  I am able to manage most of my personal care.  3. Lifting 2 = Pain prevents me from lifting heavy weights off the floor,but I can manage if the weights are conveniently positioned (e.g. on a table)  4. Walking 2 =  Pain prevents me from walking more than  mile.  5. Sitting 3 =  Pain prevents me from sitting more than  hour.  6. Standing 3 =  Pain prevents me from standing more than 1/2 hour.  7. Sleeping 4 =  Even when I take pain medication, I sleep less than 2 hour  8. Social Life 2 = Pain prevents me from participating in more energetic activities (eg. sports, dancing).  9. Traveling 2 =  My pain restricts my travel over 2 hours.  10. Employment/ Homemaking 3 = Pain prevents me from doing anything but light duties.  Total 29/50  % Disability 58.0 % - Severe   Interpretation of scores: Score Category Description  0-20% Minimal Disability The patient can cope with most living activities. Usually no treatment is indicated apart from advice on lifting, sitting and exercise  21-40% Moderate Disability The patient experiences more pain and difficulty with sitting, lifting and standing. Travel and social life are more difficult and they may be disabled from work. Personal care, sexual activity and sleeping are not grossly affected, and the patient can usually be managed by conservative means  41-60% Severe Disability Pain remains the main problem in this group, but activities of daily living are affected. These patients require a detailed investigation  61-80% Crippled Back pain impinges on all aspects of the patients life. Positive intervention is required  81-100% Bed-bound These patients are either bed-bound or exaggerating their symptoms  Bluford BRAVO, Scantlebury DELENA Karon DELENA, et al. Surgery versus conservative management of stable thoracolumbar fracture:  the PRESTO feasibility RCT. [place] ([place]): [employer]; 2021 Nov. [facility] Technology Assessment, No. 25.62.) Appendix 3, Oswestry  Disability Index category descriptors. Available from: [email or web address].cz  Minimally Clinically Important Difference (MCID) = 12.8%    NDI:  NECK DISABILITY INDEX Date:  05/13/24   Pain intensity 4 = The pain is very severe at the moment  2. Personal care (washing, dressing, etc.) 3 =  I need some help but can manage most of my personal care  3. Lifting 3 = Pain prevents me from lifting heavy weights but I can manage light to medium   weights if they are conveniently positioned  4. Reading 4 =  I can hardly read at all because of severe pain in my neck  5. Headaches 3 = I have moderate headaches, which come frequently  6. Concentration 1 =  I can concentrate fully when I want to with slight difficulty   7. Work 3 =  I cannot do my usual work  8. Driving 3 = I can't drive my car as long as I want because of moderate pain in my neck  9. Sleeping 4 = My sleep is greatly disturbed (3-5 hrs sleepless)   10. Recreation 4 =  I can hardly do any recreation activities because of pain in my neck  Total 32/50  % Disability 64.0 % - Severe   Minimum Detectable Change (90% confidence): 5 points or 10% points  SCREENING FOR RED FLAGS: Bowel or bladder incontinence: No Spinal tumors: No Cauda equina syndrome: No Compression fracture: No Abdominal aneurysm: No  COGNITION: Overall cognitive status: Within functional limits for tasks assessed     SENSATION: WFL  POSTURE:  rounded shoulders, forward head, decreased lumbar lordosis, flexed trunk , and weight shift left  PALPATION: TTP with increased muscle tension and TPs in L UT and LS TTP in R lumbar paraspinals and glutes, R>L piriformis  CERVICAL ROM:   Active ROM Eval  Flexion 31  Extension 22 - tight  Right lateral flexion 23  Left lateral flexion 20 p!  Right rotation 36  Left rotation 28 p!    (Blank rows = not tested)  UPPER EXTREMITY ROM:  Active ROM Right eval Left eval  Shoulder flexion 120  149  Shoulder extension 34 36  Shoulder abduction 139 144  Shoulder adduction    Shoulder internal rotation L1 L2  Shoulder external rotation T3 T2    (Blank rows = not tested)  UPPER EXTREMITY MMT:  MMT Right eval Left eval  Shoulder flexion 4+ 4  Shoulder extension 5 5  Shoulder abduction 4+ 4  Shoulder adduction    Shoulder internal rotation 5 5  Shoulder external rotation 4+ 4+  Middle trapezius 4 4  Lower trapezius 3- 3+  (Blank rows = not tested)  LUMBAR ROM:   Active  Eval  Flexion 90% limited  Extension 90% limited  Right lateral flexion Distal femur   Left lateral flexion Lateral femoral condyle   Right rotation 60% limited  Left rotation 60% limited    (Blank rows = not tested)  MUSCLE LENGTH: Hamstrings: mod tight B ITB: mod tight B Piriformis: mod/severe tight B Hip flexors: mod tight B Quads: mod tight B Heelcord:   LOWER EXTREMITY ROM:    Grossly WFL other than limitations due to tightness as above  LOWER EXTREMITY MMT:    MMT Right eval Left eval  Hip flexion 3 3+  Hip extension 2- 2-  Hip abduction 2 2+  Hip adduction 2 2  Hip internal rotation 4 3+  Hip external rotation 4- 4-  Knee flexion 4 4-  Knee extension 4 4  Ankle dorsiflexion 4+ 4  Ankle plantarflexion    Ankle inversion    Ankle eversion      (Blank rows = not tested)  CERVICAL SPECIAL TESTS:  Spurling's test: Negative and Distraction test: Negative  LUMBAR SPECIAL TESTS:  Straight leg raise test: Negative and Slump test: Negative  FUNCTIONAL TESTS:  5 times sit to stand: unable w/o B UE assist; 27.56 sec with B UE assist   TODAY'S TREATMENT:    06/16/2024  THERAPEUTIC EXERCISE: To improve strength and endurance.  Demonstration, verbal and tactile cues throughout for technique. Rec Bike - L2 x 6'  NEUROMUSCULAR RE-EDUCATION: To improve coordination, kinesthesia, posture, and proprioception.  Hooklying sciatic nerve glide Hooklying PPT + GTB alt hip ABD/ER  bent-knee fallouts 10 x 3-5, 2 sets - cues to control band on return to neutral position GTB hip flexion march 2 x 10 Bridge + GTB hip ABD isometric x 10 - limited lift Deadbug x 10 90/90 sequential march x 5 leading with each LE GTB B scap retraction + shoulder horizontal ABD 10 x 3-5 GTB B scap retraction + shoulder horizontal ABD diagonals 10 x 3-5 Staggered stance + GTB scap retraction + B shoulder row 2 x 10 B shoulder extension 2 x 10 Standing GTB pallof press x 10 B GTB multifidi walkouts x 10 B   06/10/24 Rec Bike L2x75min Bird dog orange pball x 10 Bent over orange pball shoulder extension IASTM with foam roll to low back and glutes Seated lumbar flexion stretch with orange pball Seated rows 15lb x 10 BUE Moist heat to low back in sitting x 15 min   06/03/24  THERAPEUTIC EXERCISE: To improve strength, endurance, ROM, and flexibility.  Demonstration, verbal and tactile cues throughout for technique.  Rec Bike - L2 x 6' Standing lumbar extension with forearms on wall 10 x 5  MANUAL THERAPY: To promote normalized muscle tension, improved flexibility, and reduced pain utilizing connective tissue massage, therapeutic massage, manual TP therapy, and percussion massage with massage gun.  STM/DTM and percussion massage to R>L UT, LS and scalenes  SELF CARE: Provided education on post-surgical precautions and to facilitate performance of basic household cleaning/chores. Provided education in proper posture and body mechanics for typical daily positioning and household chores to minimize strain on low back and neck, emphasizing BLT precautions for the immediate post-op period.  NEUROMUSCULAR RE-EDUCATION: To improve coordination, kinesthesia, posture, and proprioception.  Quadruped over orange Pball: Alt LE/hip extension x 10 - VC & TC to maintain neutral spine avoiding trunk rotation Alt UE raises x 10   05/27/24  THERAPEUTIC EXERCISE: To improve strength, endurance, ROM,  and flexibility.  Demonstration, verbal and tactile cues throughout for technique.  UBE - L2.0 x 3' each fwd & back Supine R/L HS stretch with strap 1 x 30 with opp LE straight, 3 x 30 with opp knee flexed (preferred) Supine SKTC 3 x 30 bil Hooklying LTR 10 x 5 Hooklying TrA progressing to PPT 10 x 5  NEUROMUSCULAR RE-EDUCATION: To improve coordination, kinesthesia, and posture. Hooklying PPT + GTB B scap retraction + shoulder horizontal ABD 10 x 3-5 GTB alt hip ABD/ER bent-knee fallouts 10 x 3-5 , 2 sets - cues to control band on return to neutral position Attempted bridge + GTB hip ABD isometric but deferred due to increased pain even with  limited lift GTB hip flexion march 2 x 10 Hip ADD isometric ball squeeze 10 x 5, 2 sets  SELF CARE: Provided education on post-surgical precautions and to increase independence with ADLs. Provided initial introduction to BLT precautions and review log rolling and sidelying to sit transitions to reduce low back strain. Reviewed recommended sleeping positions, avoiding prone due to strain on neck and suggesting sidelying with pillow between knees to promote neutral spine alignment.   05/20/24 THERAPEUTIC EXERCISE: To improve strength, endurance, ROM, and flexibility.  UBE L2.0 3 min fwd/ 3 min back Bike L2x41min Seated UT and levator stretches reviewed Seated lumbar flexion stretch touching floor 2x30' Supine LTR both ways 10x5' Supine SKTC R/L 2 x 30' Supine HS stretch 2x30' BLE w/ strap Seated ab sets orange pball 10x5'   05/13/2024 - Eval SELF CARE:  Reviewed eval findings and role of PT in addressing identified deficits as well as instruction in initial HEP (see below).  Patient inquiring about use of estim which is not covered by his [employer], therefore provided information on a home TENS unit for self-purchase.   PATIENT EDUCATION:  Education details: HEP progression - core/lumbopelvic strengthening  Person  educated: Patient Education method: Explanation, Demonstration, Verbal cues, Tactile cues, and MedBridgeGO app updated Education comprehension: verbalized understanding, returned demonstration, verbal cues required, tactile cues required, and needs further education  HOME EXERCISE PROGRAM: *Pt using MedBridgeGO app.  Access Code: [email or web address] URL: [email or web address] Date: 06/03/2024 Prepared by: Elijah Hidden  Exercises - Seated Gentle Upper Trapezius Stretch (Mirrored)  - 2-3 x daily - 7 x weekly - 3 reps - 30 sec hold - Gentle Levator Scapulae Stretch  - 2-3 x daily - 7 x weekly - 3 reps - 30 sec hold - Seated 3 Way Exercise Ball Roll Out Stretch  - 2-3 x daily - 7 x weekly - 10 reps - 5 sec hold - Supine Lower Trunk Rotation  - 2-3 x daily - 7 x weekly - 10 reps - 5-10 sec hold - Hooklying Hamstring Stretch with Strap  - 2-3 x daily - 7 x weekly - 3 reps - 30 sec hold - Hooklying Single Knee to Chest Stretch  - 2-3 x daily - 7 x weekly - 3 reps - 30 sec hold - Supine Posterior Pelvic Tilt  - 1 x daily - 7 x weekly - 2 sets - 10 reps - 5 sec hold - Supine Hip Adduction Isometric with Ball  - 1 x daily - 7 x weekly - 2 sets - 10 reps - 5 sec hold - Supine Shoulder Horizontal Abduction with Resistance  - 1 x daily - 7 x weekly - 2 sets - 10 reps - 3 sec hold - Hooklying Single Leg Bent Knee Fallouts with Resistance  - 1 x daily - 7 x weekly - 2 sets - 10 reps - 3 sec hold - Supine March with Resistance Band  - 1 x daily - 7 x weekly - 2 sets - 10 reps - 2-3 sec hold hold  Patient Education - TENS Therapy - TENS UNIT - AUVON Dual Channel TENS Unit - Post-Op Back Precautions - Posture and Body Mechanics   ASSESSMENT:  CLINICAL IMPRESSION: Brett Allen reports his surgery was denied by Medicaid and he has yet to f/u with the surgeon to see what the plan is going forward.  He reports some inconsistencies with the HEP but reports the supine exercises seem to work best for him,  therefore initially focused  on core, postural and lumbopelvic activation and strengthening from hooklying position today, eventually progressing to standing with good tolerance for all attempted activities including improved tolerance for bridges today but still with limited lift.  Brett Allen is progressing toward his goals with all STGs met except pain goal only partially met due to limited to no change in back pain levels.  He will benefit from continued skilled PT to address ongoing cervical and lumbar ROM/flexibility, postural, core and proximal UE/LE strength deficits to improve mobility and activity tolerance with decreased pain interference.    EVAL: Brett Allen is a 48 y.o. male who was referred to physical therapy for evaluation and treatment for acute L-sided neck pain and chronic R-sided lumbar pain with intermittent R LE radicular pain, numbness and tingling.  He also reports chronic R shoulder pain and limited ROM following surgery ~1 yr ago s/p chronic dislocations.   Patient reports onset of L-sided neck pain beginning ~1 month ago while brushing his teeth.  Neck pain is intermittent, worse with sleeping positions and turning his head.  Patient reports onset of R-sided low back pain 17+ years ago, exacerbated 2-3 yrs ago with a job requiring repetitive bending, lifting and twisting. Low back pain is chronic and constant, with electric shock down R LE to knee and ankle starting ~2 months ago, worse with prolonged sitting or standing.  Patient has deficits in cervical and lumber ROM, B LE flexibility, core/postural and B UE/LE strength, abnormal posture, and TTP with abnormal muscle tension which are interfering with ADLs and are impacting quality of life.  On NDI patient scored 32/50 demonstrating 64% or severe disability.  On Modified Oswestry patient scored 29/50 demonstrating 58% or severe disability.  Brett Allen will benefit from skilled PT to address above deficits to improve mobility and activity  tolerance with decreased pain interference.  OBJECTIVE IMPAIRMENTS: Abnormal gait, decreased activity tolerance, decreased endurance, decreased knowledge of condition, decreased mobility, difficulty walking, decreased ROM, decreased strength, hypomobility, increased fascial restrictions, impaired perceived functional ability, increased muscle spasms, impaired flexibility, impaired sensation, impaired UE functional use, improper body mechanics, postural dysfunction, and pain.   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   REHAB POTENTIAL: Good  CLINICAL DECISION MAKING: Unstable/unpredictable  EVALUATION COMPLEXITY: High   GOALS: Goals reviewed with patient? Yes  SHORT TERM GOALS: Target date: 06/10/2024  Patient will be independent with initial HEP to improve outcomes and carryover.  Baseline: HEP initiated on eval Goal status: MET - 05/27/24  2.  Patient will report 25% improvement in neck and low back pain to improve QOL. Baseline: Neck = 5/10 & Low back = 7/10 on eval, both up to 10/10 at worst 06/03/24 - pain essentially unchanged,except neck pain slightly worse (likely triggered by sleeping in prone) Goal status: PARTIALLY MET - 06/16/24 - neck 50% improved but low back pain essentially unchanged  3.  Patient will demonstrate improved posture to decrease muscle imbalance. Baseline: forward head, rounded  shoulders, decreased lumbar lordosis, flexed trunk, and weight shift left 06/03/24 - improving posture in standing but still with tendency for fwd head, rounded shoulders and  increased trunk flexion in sitting Goal status: MET - 06/16/24 - better postural awareness with benefit noted from wearing LSO  LONG TERM GOALS: Target date: 07/08/2024  Patient will be independent with ongoing/advanced HEP for self-management at home.  Baseline:  Goal status: IN PROGRESS - 06/16/24  2.  Patient will report 50-75% improvement in neck and low back pain to improve QOL.  Baseline: Neck = 5/10 & Low back = 7/10 on eval, both up to 10/10 at worst Goal status: IN PROGRESS - 06/16/24 - neck 50% improved but low back pain essentially unchanged  3.  Patient to demonstrate ability to achieve and maintain good spinal alignment and body mechanics needed for daily activities. Baseline:  06/03/24 - education provided in proper posture and body mechanics for typical daily positioning and household chores to minimize strain on low back and neck as well as post-op BLT precautions  Goal status: IN PROGRESS - 06/16/24 - better postural awareness with benefit noted from wearing LSO, pt reports he has been trying to incorporate the posture and body mechanics training in to his daily mobility and sleeping positions  4.  Patient will demonstrate functional pain free cervical ROM for safety with driving.  Baseline: Refer to above cervical ROM table Goal status: INITIAL  5.  Patient will demonstrate functional pain free lumbar ROM to perform ADLs.   Baseline: Refer to above lumbar ROM table Goal status: INITIAL  7.  Patient will demonstrate improved functional strength as demonstrated by improved B postural and UE/LE strength to >/= 4 to 4+/5. Baseline: Refer to above UE/LE MMT tables Goal status: INITIAL  6.  Patient will report </= 54% on NDI (MCID = 10%) to demonstrate improved functional ability.  Baseline: 32 / 50  = 64.0 % Goal status: INITIAL   7.  Patient will report </= 46% on Modified Oswestry (MCID = 12%) to demonstrate improved functional ability.  Baseline: 29 / 50 = 58.0 % Goal status: INITIAL  8.  Patient to report ability to perform ADLs, household, and work-related tasks without need for assistance of his wife. Baseline: needs assistance of wife with washing his back and most household chores Goal status: INITIAL   9.  Patient will tolerate >30 min of sitting or standing w/o increase in pain or radicular symptoms to allow for increased ease of ADL performance. Baseline: Back pain prevents sitting or standing for >30 minutes Goal status: INITIAL  10.  Patient will report centralization of R LE radicular symptoms.  Baseline: electric shock down R LE to knee and ankle Goal status: INITIAL   PLAN:  PT FREQUENCY: 2x/week  PT DURATION: 8 weeks  PLANNED INTERVENTIONS: 02835- PT Re-evaluation, 97750- Physical Performance Testing, 97110-Therapeutic exercises, 97530- Therapeutic activity, 97112- Neuromuscular re-education, 97535- Self Care, 02859- Manual therapy, [phone]- Gait training, [phone]- Aquatic Therapy, [phone]- Ultrasound, [phone] (1-2 muscles), [id number] (3+ muscles)- Dry Needling, Patient/Family education, Balance training, Stair training, Taping, Joint mobilization, Spinal mobilization, Cryotherapy, and Moist heat  PLAN FOR NEXT SESSION: gently progress cervical and lumbar ROM/stretching; review & progress HEP PRN; MT +/- TPDN to address TPs/abnormal muscle tension in L UT, lumbar paraspinals, glues and piriformis; review posture & body mechanics education and BLT precautions PRN    Elijah CHRISTELLA Hidden, PT 06/16/2024, 10:32 AM  "

## 2024-06-17 NOTE — Progress Notes (Signed)
 Seen by patient Clarisse ONEIDA Peck on 06/16/2024 10:29 PM

## 2024-06-18 ENCOUNTER — Other Ambulatory Visit (HOSPITAL_BASED_OUTPATIENT_CLINIC_OR_DEPARTMENT_OTHER): Payer: Self-pay

## 2024-06-18 ENCOUNTER — Ambulatory Visit: Admitting: Medical

## 2024-06-18 ENCOUNTER — Other Ambulatory Visit: Payer: Self-pay

## 2024-06-18 VITALS — BP 128/82 | HR 83 | Temp 97.9°F | Resp 16 | Ht 70.0 in | Wt 214.6 lb

## 2024-06-18 DIAGNOSIS — M5416 Radiculopathy, lumbar region: Secondary | ICD-10-CM

## 2024-06-18 DIAGNOSIS — L089 Local infection of the skin and subcutaneous tissue, unspecified: Secondary | ICD-10-CM | POA: Diagnosis not present

## 2024-06-18 DIAGNOSIS — L910 Hypertrophic scar: Secondary | ICD-10-CM

## 2024-06-18 MED ORDER — CLINDAMYCIN HCL 150 MG PO CAPS
150.0000 mg | ORAL_CAPSULE | Freq: Three times a day (TID) | ORAL | 0 refills | Status: DC
  Filled 2024-06-18: qty 30, 10d supply, fill #0

## 2024-06-18 MED ORDER — GABAPENTIN 100 MG PO CAPS
100.0000 mg | ORAL_CAPSULE | Freq: Three times a day (TID) | ORAL | 0 refills | Status: DC
  Filled 2024-06-18 (×2): qty 90, 30d supply, fill #0

## 2024-06-18 NOTE — Patient Instructions (Addendum)
 Skin infection and keloid scar of the back of the neck(presently not appearing that abscess has reformed.) Skin infection improved with doxycycline . Area remains indurated. Culture showed MRSA and streptococcal agalactiae, sensitive to doxycycline . Clindamycin considered more potent per sensitivity report. - Prescribed clindamycin 150 mg TID for 7 days. - Advised OTC probiotics and probiotic-rich foods. - Continue steroid cream on lower affected area not over indurated area. only on keloid area - Scheduled follow-up in 7-10 days.  Lumbar radiculopathy with chronic pain Chronic lumbar radiculopathy with persistent leg pain. Previous surgery denied. Tramadol  effective for pain and sleep. Gabapentin  and tramadol  can be used together with caution for sedation. - Prescribed gabapentin  100 mg TID. - Continue tramadol  as prescribed, refill after December 4th. - Advised monitoring for sedation with gabapentin  and tramadol .

## 2024-06-18 NOTE — Progress Notes (Signed)
 "  Subjective:    Patient ID: Brett Allen, male    DOB: 1975-08-11, 48 y.o.   MRN: [id number]  HPI  Pt last visit AVS below in in    UC visit hpi below  Around 06-05-24 he developed a bump on the back of his neck that progressed to an abscess. It was drained at urgent care with more blood than pus expressed. Since then the site has been draining yellow fluid, and he changes the bandage regularly.   Below UC note in   Patient is a 48 year old gentleman present to the clinic today with reports of a firm, painful abscess to the back of his scalp, most notable this morning, 06/05/2024. Patient does report having several small bumps to this area transiently, after having a haircut several months ago. Patient reports area is extremely painful, slightly itchy, but not causing any other radiating discomfort, headache, nor fever. Patient with history of hypertension and diabetes for which he uses Ozempic .      1 Acute complicated illness or injury  Explanation of Medical Decision Making and variances from expected care: Patient is a pleasant 48 year old male that presents to the clinic today for evaluation of an abscess to the occipital region of scalp, that grew overnight, became significantly painful for him. There was surrounding evidence of folliculitis without vesicular-like pattern. I did inform patient I could perform an incision and drainage, however, did inform him that there is no guarantee this would remove all loculation, and this could theoretically recur, as well as risks of scarring, infection, and significant debilitation as a result. This being said, patient did opt for this. I did thoroughly clean and irrigate area, instilled 2% lidocaine  without epinephrine  for anesthesia, and did straight puncture with 11 blade. Scant pustulant drainage did present, however, predominant oozing bleeding occurred causing serosanguineous appearance. Did instill quarter iodoform gauze packing  after continued oozing blood, and bandage over top. I will give patient doxycycline  as coverage for bacterial folliculitis, as well as for this assumed entirely not drained abscess. I do recommend patient follow with PCP, dermatology, and possibly general surgery for no improvement. I also recommend he return in 3 days for removal of packing, or present to 1 of these clinicians for removal of packing and continued treatment. Patient does report being up-to-date with tetanus within the past 2 years. Patient understands signs and symptoms observe for requiring emergent reassessment.             Last visit hpi  He is taking doxycycline  twice daily and has about 8 to 10 pills left. The packing came out when he removed a bandage. He uses warm compresses on the area.   He has keloids, folliculitis, and recalls prior cellulitis, and he has a dermatology follow-up planned for keloids.    Today date of service below Brett Allen is a 48 year old male who presents for follow-up on a neck abscess and management of back pain.  He is following up on a neck abscess previously treated at urgent care. Symptoms have improved, but the area remains hard and indurated with significant itching. No discharge since i and d and started and finished doxy.   He has been applying a steroid cream twice daily to area below i and d area where by history appears has keloid type scars buy has been applying to former abscess/skin infected area.  He completed a 7-day course of doxycycline  after a June 09, 2024 culture showed MRSA and Streptococcus  agalactiae, with MRSA sensitive to doxycycline . Despite treatment, the area remains indurated.  He has chronic back pain with nerve pain into the leg, described as constant and throbbing and limiting lower body strength and daily activities. Planned lumbar percutaneous pedicle screw surgery on July 02, 2024 was denied by [facility], so he continues conservative  management. Tramadol  as needed and ibuprofen  help with pain and function. He stopped gabapentin  due to delayed onset of effect. He wears a back brace and attends physical therapy with partial relief.  He uses tramadol  as needed and has a limited supply. He notes tramadol  does not cause drowsiness for him.   Review of Systems  Constitutional:  Negative for chills, fatigue and fever.  HENT:  Negative for congestion.   Respiratory:  Negative for chest tightness, shortness of breath and wheezing.   Cardiovascular:  Negative for chest pain and palpitations.  Gastrointestinal:  Negative for abdominal pain and nausea.  Musculoskeletal:  Negative for neck pain and neck stiffness.  Skin:        See hpi  Neurological:  Negative for dizziness, weakness and light-headedness.  Hematological:  Negative for adenopathy.  Psychiatric/Behavioral:  Negative for behavioral problems.     Past Medical History:  Diagnosis Date   Back pain    lower   Cellulitis    Diabetes mellitus without complication (HCC)    Hypertension    Sarcoidosis    right eye     Social History   Socioeconomic History   Marital status: Married    Spouse name: Not on file   Number of children: Not on file   Years of education: Not on file   Highest education level: 12th grade  Occupational History   Not on file  Tobacco Use   Smoking status: Former    Types: Cigarettes   Smokeless tobacco: Never  Vaping Use   Vaping status: Never Used  Substance and Sexual Activity   Alcohol use: Not Currently    Comment: occassionally    Drug use: No   Sexual activity: Yes    Partners: Female    Comment: married 18 years.  Other Topics Concern   Not on file  Social History Narrative   Not on file   Social Drivers of Health   Tobacco Use: Medium Risk (06/16/2024)   Patient History    Smoking Tobacco Use: Former    Smokeless Tobacco Use: Never    Passive Exposure: Not on file  Financial Resource Strain: Low Risk  (04/21/2024)   Overall Financial Resource Strain (CARDIA)    Difficulty of Paying Living Expenses: Not hard at all  Food Insecurity: Food Insecurity Present (04/21/2024)   Epic    Worried About Programme Researcher, Broadcasting/film/video in the Last Year: Sometimes true    [employer] of Food in the Last Year: Often true  Transportation Needs: No Transportation Needs (04/21/2024)   Epic    Lack of Transportation (Medical): No    Lack of Transportation (Non-Medical): No  Physical Activity: Sufficiently Active (04/21/2024)   Exercise Vital Sign    Days of Exercise per Week: 3 days    Minutes of Exercise per Session: 50 min  Stress: Stress Concern Present (04/21/2024)   [employer] of Occupational Health - Occupational Stress Questionnaire    Feeling of Stress: To some extent  Social Connections: Moderately Integrated (04/21/2024)   Social Connection and Isolation Panel    Frequency of Communication with Friends and Family: Once a week    Frequency  of Social Gatherings with Friends and Family: Once a week    Attends Religious Services: More than 4 times per year    Active Member of [employer] or Organizations: Yes    Attends Engineer, Structural: More than 4 times per year    Marital Status: Married  Catering Manager Violence: Not At Risk (10/29/2022)   Humiliation, Afraid, Rape, and Kick questionnaire    Fear of Current or Ex-Partner: No    Emotionally Abused: No    Physically Abused: No    Sexually Abused: No  Depression (PHQ2-9): Low Risk (04/22/2024)   Depression (PHQ2-9)    PHQ-2 Score: 4  Alcohol Screen: Low Risk (04/21/2024)   Alcohol Screen    Last Alcohol Screening Score (AUDIT): 0  Housing: Unknown (04/21/2024)   Epic    Unable to Pay for Housing in the Last Year: No    Number of Times Moved in the Last Year: Not on file    Homeless in the Last Year: No  Utilities: Low Risk (04/02/2023)   Received from [facility]   Utilities    In the past 12 months has the electric, gas, oil, or water  company threatened to shut off services in your home? : No  Health Literacy: Not on file    Past Surgical History:  Procedure Laterality Date   left wrsit surgery Left    WRIST FRACTURE SURGERY Left 2017    Family History  Problem Relation Age of Onset   Hypertension Mother    Diabetes Mother    Hypertension Brother     Allergies[1]  Medications Ordered Prior to Encounter[2]  BP 128/82   Pulse 83   Temp 97.9 F (36.6 C) (Oral)   Resp 16   Ht 5' 10 (1.778 m)   Wt 214 lb 9.6 oz (97.3 kg)   SpO2 98%   BMI 30.79 kg/m        Objective:   Physical Exam   Physical Exam   General Mental Status- Alert. General Appearance- Not in acute distress.    Skin Base of occiptal area/neck junction flattened out area with small incision mark and dry yellow dc. No fluctuance. Surrounding area mild indurated with also small keloid like scars in area   Neck No JVD.    Chest and Lung Exam Auscultation: Breath Sounds:-CTA   Cardiovascular Auscultation:Rythm- RRR Murmurs & Other Heart Sounds:Auscultation of the heart reveals- No Murmurs.   Abdomen Inspection:-Inspeection Normal. Palpation/Percussion:Note:No mass. Palpation and Percussion of the abdomen reveal- Non Tender, Non Distended + BS, no rebound or guarding.     Neurologic Cranial Nerve exam:- CN III-XII intact(No nystagmus), symmetric smile. Strength:- 5/5 equal and symmetric strength both upper and lower extremities.      Assessment & Plan:   Skin infection and keloid scar of the back of the neck Skin infection improved with doxycycline . Area remains indurated. Culture showed MRSA and streptococcal agalactiae, sensitive to doxycycline . Clindamycin considered more potent per sensitivity report. - Prescribed clindamycin 150 mg TID for 7 days. - Advised OTC probiotics and probiotic-rich foods. - Continue steroid cream on lower affected area not over indurated area. only on keloid area - Scheduled follow-up in  7-10 days.  Lumbar radiculopathy with chronic pain Chronic lumbar radiculopathy with persistent leg pain. Previous surgery denied. Tramadol  effective for pain and sleep. Gabapentin  and tramadol  can be used together with caution for sedation. - Prescribed gabapentin  100 mg TID. - Continue tramadol  as prescribed, refill after December 4th. - Advised  monitoring for sedation with gabapentin  and tramadol .  Dallas Maxwell, PA-C     [1] No Known Allergies [2]  Current Outpatient Medications on File Prior to Visit  Medication Sig Dispense Refill   amLODipine  (NORVASC ) 10 MG tablet Take 1 tablet (10 mg total) by mouth daily. 90 tablet 0   buPROPion (WELLBUTRIN XL) 150 MG 24 hr tablet Take 150 mg by mouth every morning.     cyclobenzaprine  (FLEXERIL ) 10 MG tablet Take 1 tablet (10 mg total) by mouth at bedtime as needed. 3 tablet 0   ibuprofen  (ADVIL ) 800 MG tablet Take 1 tablet (800 mg total) by mouth every 8 (eight) hours as needed. 30 tablet 1   mupirocin  ointment (BACTROBAN ) 2 % Apply 1 Application topically 2 (two) times daily. 22 g 2   potassium chloride  (KLOR-CON  M) 10 MEQ tablet Take 1 tablet (10 mEq total) by mouth daily. 30 tablet 2   Semaglutide ,0.25 or 0.5MG /DOS, (OZEMPIC , 0.25 OR 0.5 MG/DOSE,) 2 MG/3ML SOPN Inject 0.25 mg into the skin once a week. 3 mL 0   traMADol  (ULTRAM ) 50 MG tablet Take 1 tablet (50 mg total) by mouth at bedtime as needed for pain. 30 tablet 0   valsartan  (DIOVAN ) 320 MG tablet Take 1 tablet (320 mg total) by mouth daily. 90 tablet 0   No current facility-administered medications on file prior to visit.   "

## 2024-06-23 ENCOUNTER — Other Ambulatory Visit (HOSPITAL_BASED_OUTPATIENT_CLINIC_OR_DEPARTMENT_OTHER): Payer: Self-pay

## 2024-06-23 ENCOUNTER — Ambulatory Visit

## 2024-06-23 MED ORDER — OZEMPIC (0.25 OR 0.5 MG/DOSE) 2 MG/3ML ~~LOC~~ SOPN
0.5000 mg | PEN_INJECTOR | SUBCUTANEOUS | 0 refills | Status: AC
  Filled 2024-06-23 – 2024-06-26 (×3): qty 3, 28d supply, fill #0

## 2024-06-23 NOTE — Addendum Note (Signed)
 Addended by: DORINA DALLAS DORINA PA-C M on: 06/23/2024 12:57 PM   Modules accepted: Orders

## 2024-06-24 ENCOUNTER — Encounter: Payer: Self-pay | Admitting: Family Medicine

## 2024-06-24 ENCOUNTER — Ambulatory Visit (INDEPENDENT_AMBULATORY_CARE_PROVIDER_SITE_OTHER): Admitting: Family Medicine

## 2024-06-24 ENCOUNTER — Encounter: Payer: Self-pay | Admitting: Physical Therapy

## 2024-06-24 ENCOUNTER — Other Ambulatory Visit (HOSPITAL_BASED_OUTPATIENT_CLINIC_OR_DEPARTMENT_OTHER): Payer: Self-pay

## 2024-06-24 ENCOUNTER — Ambulatory Visit: Attending: Neurosurgery | Admitting: Physical Therapy

## 2024-06-24 VITALS — BP 100/72 | HR 93 | Temp 98.1°F | Resp 18 | Ht 70.0 in | Wt 218.4 lb

## 2024-06-24 DIAGNOSIS — G8929 Other chronic pain: Secondary | ICD-10-CM | POA: Insufficient documentation

## 2024-06-24 DIAGNOSIS — M48062 Spinal stenosis, lumbar region with neurogenic claudication: Secondary | ICD-10-CM | POA: Diagnosis not present

## 2024-06-24 DIAGNOSIS — R293 Abnormal posture: Secondary | ICD-10-CM | POA: Diagnosis present

## 2024-06-24 DIAGNOSIS — M5416 Radiculopathy, lumbar region: Secondary | ICD-10-CM

## 2024-06-24 DIAGNOSIS — M6281 Muscle weakness (generalized): Secondary | ICD-10-CM | POA: Insufficient documentation

## 2024-06-24 DIAGNOSIS — M542 Cervicalgia: Secondary | ICD-10-CM | POA: Diagnosis present

## 2024-06-24 DIAGNOSIS — M5441 Lumbago with sciatica, right side: Secondary | ICD-10-CM | POA: Diagnosis present

## 2024-06-24 DIAGNOSIS — R2689 Other abnormalities of gait and mobility: Secondary | ICD-10-CM | POA: Diagnosis present

## 2024-06-24 MED ORDER — PREDNISONE 10 MG PO TABS
ORAL_TABLET | ORAL | 0 refills | Status: AC
  Filled 2024-06-24: qty 42, 21d supply, fill #0

## 2024-06-24 MED ORDER — CYCLOBENZAPRINE HCL 10 MG PO TABS
10.0000 mg | ORAL_TABLET | Freq: Every evening | ORAL | 0 refills | Status: AC | PRN
  Filled 2024-06-24: qty 3, 3d supply, fill #0

## 2024-06-24 MED ORDER — METHYLPREDNISOLONE ACETATE 80 MG/ML IJ SUSP
80.0000 mg | Freq: Once | INTRAMUSCULAR | Status: AC
  Administered 2024-06-24: 80 mg via INTRAMUSCULAR

## 2024-06-24 NOTE — Therapy (Signed)
 " OUTPATIENT PHYSICAL THERAPY TREATMENT   Patient Name: Brett Allen MRN: [id number] DOB:[birth year], 49 y.o., male Today's Date: 06/24/2024   END OF SESSION:  PT End of Session - 06/24/24 0930     Visit Number 7    Date for Recertification  07/08/24    Authorization Type [employer]    Authorization Time Period 05/13/24 - 08/11/24    Authorization - Visit Number 7    Authorization - Number of Visits 14    PT Start Time 0930    PT Stop Time 1012    PT Time Calculation (min) 42 min    Activity Tolerance Patient tolerated treatment well    Behavior During Therapy WFL for tasks assessed/performed                Past Medical History:  Diagnosis Date   Back pain    lower   Cellulitis    Diabetes mellitus without complication (HCC)    Hypertension    Sarcoidosis    right eye   Past Surgical History:  Procedure Laterality Date   left wrsit surgery Left    WRIST FRACTURE SURGERY Left 2017   Patient Active Problem List   Diagnosis Date Noted   Cellulitis 10/27/2022   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   Chronic bilateral low back pain without sciatica 07/23/2015   Hyperlipidemia 07/09/2015    PCP: Dorina Loving, PA-C   REFERRING PROVIDER: Gillie Duncans, MD   REFERRING DIAG: M54.2 (ICD-10-CM) - Cervicalgia  Lumbar & Cervical pain  THERAPY DIAG:  Cervicalgia  Chronic right-sided low back pain with right-sided sciatica  Muscle weakness (generalized)  Abnormal posture  Other abnormalities of gait and mobility  RATIONALE FOR EVALUATION AND TREATMENT: Rehabilitation  ONSET DATE: 04/27/24 - Neck pain;  Chronic - Lumbar pain  NEXT MD VISIT: 07/01/24   SUBJECTIVE:                                                                                                                                                                                                         SUBJECTIVE STATEMENT: Pt reports he has stopped sleeping on his stomach  and is trying to sleep on his back.  Increased LBP and R LE radicular pain, numbness and tingling over the past few days - has an appt with PCP office today for this.  Will see Dr. Gillie on 07/01/24.  EVAL: Pt reports continued limitations in R shoulder since his surgery on 05/15/23.  He reports he reinjured his shoulder during work hardening/conditioning while preparing to return  to work.  He states he has been given a 15% disability because of this which has kept him out of work.  His neck pain has been bad for about a 1 month, triggered as he went to brush his teeth one morning.  Pain comes and goes but throbs all day long when present, limiting his ability to turn his head and chew.  Low back pain has been chronic for 17+ years (since moving to [place] in 2008).  Has h/o chiropractor work for his back - seen 3-4x in past month with ~3 visits remaining.  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  Feels like he is crooked and leaning forward when he walks.   PAIN: Are you having pain? Yes: NPRS scale: 10/10  Pain location: midline low back to R, electric shock down R LE to knee and ankle starting ~2 months ago (trigger by prolonged sitting and standing)  Pain description: stabbing, locks up at times  Aggravating factors: prolonged sitting or standing, rising from bed in the morning  Relieving factors: meds take the edge off, heat, chiropractor - nothing really lasts   Are you having pain? Yes: NPRS scale: 6-7/10  Pain location: R lateral neck & upper shoulder  Pain description: throbbing on a normal day, sharp stabbing on bad day with massive headache  Aggravating factors: turning head to L, lying in awkward positions (esp on L side)   Relieving factors: heat, compression, meds (flexeril  at night, ibuprofen )   Are you having pain? Yes: NPRS scale: 7/10  Pain location: R anterior  shoulder  Pain description: stinging, crunching, occasional numbness, TTP, throbbing  Aggravating factors: end range ER, lifting, sports  Relieving factors: ice, ibuprofen    PERTINENT HISTORY:  R shoulder surgery 05/15/23, chronic R shoulder dislocation/subluxation, excision of neck tumor 04/02/23, DM, HTN, sarcoidosis, chronic LBP    Lumbar fusion surgery scheduled for 07/02/2024  PRECAUTIONS: None  HAND DOMINANCE: Right  RED FLAGS: None  WEIGHT BEARING RESTRICTIONS: No  FALLS:  Has patient fallen in last 6 months? No  LIVING ENVIRONMENT: Lives with: lives with their family Lives in: House Stairs: Yes: Internal: 10-12 steps; on right going up and External: 1 steps; none Has following equipment at home: None  OCCUPATION: Unemployed  PLOF: Independent with gait, Independent with transfers, Needs assistance with ADLs, and Leisure: walking; play video games, play basketball - currently unable  PATIENT GOALS: Some type of relief of my pain.    OBJECTIVE:   DIAGNOSTIC FINDINGS:  Neck x-ray unavailable  05/06/24 - MRI LUMBAR SPINE CLINICAL HISTORY: 48 year old male with spondylolisthesis, lumbar region.   FINDINGS:   BONES AND ALIGNMENT: Normal lumbar segmentation on the comparison radiographs. Stable lordosis since 2023. Mild degenerative retrolisthesis from L3-L4 through L5-S1. Normal background bone marrow signal. Chronic degenerative endplate marrow signal changes at L3-L4 and L4-L5, with superimposed degenerative appearing left lateral endplate marrow edema (left greater than right) at L3-L4 (series 6 image 11). No associated paraspinal soft tissue inflammation. No other marrow edema.   SPINAL CORD: Normal conus medullaris at L1-L2. No signal abnormality in the visible lower thoracic spinal cord or conus.   SOFT TISSUES: No paraspinal mass.   DEGENERATIVE:   Visible lower thoracic spine through L2-L3 are negative. L3-L4: Chronic severe disc space  loss. Circumferential disc osteophyte complex. Degenerative appearing endplate marrow edema. Mild to moderate facet and ligamentum flavum hypertrophy. No spinal or  lateral recess stenosis. Mild to moderate bilateral L3 neural foraminal stenosis appears stable. L4-L5: Similar advanced chronic disc degeneration and disc space loss. Circumferential disc bulge with mild endplate spurring. Mild posterior element hypertrophy. No spinal or lateral recess stenosis. Moderate left and mild right L4 neural foraminal stenosis (series 5 image 11 on the left). This level appears stable. L5-S1: Mild chronic retrolisthesis with better preserved disc height and disc signal. Mild circumferential disc bulge. Mild to moderate facet hypertrophy. No spinal or lateral recess stenosis. Mild to moderate bilateral L5 neural foraminal stenosis. This level is stable.   IMPRESSION: 1. Very age-advanced lumbar disc and endplate degeneration at L3-L4 and L4-L5, with superimposed mild degenerative-appearing endplate marrow edema at the former. Mild degenerative retrolisthesis at those levels and L5-S1. 2. But no significant lumbar spinal or lateral recess stenosis. And up to moderate associated neural foraminal stenosis at those levels does not appear significantly changed from a 2023 MRI.Lumbar MRI pending  04/22/24 - DG LUMBAR SPINE - 2-3 VIEW CLINICAL DATA:  Lumbar radicular pain on the right   FINDINGS: There is no evidence of lumbar spine fracture. Degenerative changes of the spine predominantly involving the L3-L4 and, L4-L5 and L5-S1 with associated decreased intervertebral disc space and facet hypertrophy. Grade 1 anterolisthesis of L4-L5.   Soft tissues are unremarkable.   IMPRESSION: Degenerative disc disease involving L3- L4 through L5-S1.   Grade 1 anterolisthesis of L4-L5.  01/07/24 - MRI RIGHT SHOULDER WITHOUT CONTRAST, 01/07/2024 12:08 PM  INDICATION: Pain in right shoulder \ M25.511 Pain in  right shoulder ,   FINDINGS:   . Acromioclavicular joint: Mild degenerative changes. No effusion or malalignment.  .  Acromion: No subacromial spurring. Mild subacromial subdeltoid bursitis.  .  Supraspinatus tendon: Mild tendinosis.  .  Infraspinatus tendon: Mild tendinosis.  .  Subscapularis tendon: Intact.  .  Teres minor tendon: Intact.  .  Long head of biceps: Mild tendinosis above the bicipital groove.  .  Glenohumeral joint: No effusion or malalignment.  .  Labrum: Postsurgical changes related to anterior inferior labral repair/Bankart. Linear T2 signal within the posterior inferior labrum [series 3 image 14-15, series 7 image 16] favors to represent postsurgical changes rather than a labral tear, for correlation with operative notes.  .  Bones: Bony remodeling within the anterior-inferior glenoid without edema like signal, likely a sequelae of anterior shoulder dislocation [series 8 image 16, series 4 image 18]. Remote Hill-Sachs fracture. Normal marrow signal. No fracture, neoplasm, or avascular necrosis.  .  Additional comments: No muscle atrophy.   IMPRESSION:  1. Remodeling of the anterior inferior glenoid likely related to remote bony Bankart fracture and remote Hill-Sachs fracture, likely a sequelae of prior anterior shoulder dislocation.  2.  Postsurgical changes related to labral repair.  3.  Linear T2 signal within the posterior inferior labrum, favors to represent postsurgical changes, for correlation with operative notes.  4.  Mild tendinosis supraspinatus, infraspinatus and long head of biceps.  5.  Mild subacromial/subdeltoid bursitis.  6.  Mild AC joint degenerative changes.   04/07/22 - MRI LUMBAR SPINE WITHOUT CONTRAST  CLINICAL DATA:  Initial evaluation for lumbar degenerative disc disease.   FINDINGS:  Segmentation: Standard. Lowest well-formed disc space labeled the  L5-S1 level.   Alignment: 3 mm degenerative retrolisthesis of L4 on L5, with trace  2 mm  retrolisthesis of L3 on L4. Alignment otherwise normal with  preservation of the normal lumbar lordosis.   Vertebrae: Vertebral body height maintained without acute or  chronic  fracture. Bone marrow signal intensity within normal limits. No  worrisome osseous lesions. Discogenic reactive endplate changes  present about the L3-4 and L4-5 interspaces. No other abnormal  marrow edema.   Conus medullaris and cauda equina: Conus extends to the L2 level.  Conus and cauda equina appear normal.   Paraspinal and other soft tissues: Unremarkable.   Disc levels:   L1-2:  Unremarkable.   L2-3:  Unremarkable.   L3-4: Degenerative intervertebral disc space narrowing with  circumferential disc bulge and disc desiccation. Associated reactive  endplate change with marginal endplate osteophytic spurring. Mild  facet hypertrophy. No significant spinal stenosis. Mild bilateral L3  foraminal narrowing. No frank impingement.   L4-5: Degenerative with abscesses narrowing with diffuse disc bulge  and disc desiccation. Associated reactive endplate spurring. Mild  facet and ligament flavum hypertrophy. No significant spinal  stenosis. Mild right with mild to moderate left L4 foraminal  stenosis.   L5-S1: Mild diffuse disc bulge with endplate spurring. Mild  bilateral facet hypertrophy. No spinal stenosis. Mild bilateral L5  foraminal narrowing. No frank impingement.   IMPRESSION:  1. Lower lumbar degenerative disc disease at L3-4 through L5-S1 with  resultant mild to moderate bilateral L3 through L5 foraminal  stenosis as above. No significant spinal stenosis or overt neural  impingement.  2. Mild bilateral facet hypertrophy at L3-4 through L5-S1.   PATIENT SURVEYS:  Oswestry Low Back Pain Disability Questionnaire:  MODIFIED OSWESTRY DISABILITY SCALE Date:  05/13/24   Pain intensity 5 =  Pain medication has no effect on my pain.  2. Personal care (washing, dressing, etc.) 3 =  I need help, but  I am able to manage most of my personal care.  3. Lifting 2 = Pain prevents me from lifting heavy weights off the floor,but I can manage if the weights are conveniently positioned (e.g. on a table)  4. Walking 2 =  Pain prevents me from walking more than  mile.  5. Sitting 3 =  Pain prevents me from sitting more than  hour.  6. Standing 3 =  Pain prevents me from standing more than 1/2 hour.  7. Sleeping 4 =  Even when I take pain medication, I sleep less than 2 hour  8. Social Life 2 = Pain prevents me from participating in more energetic activities (eg. sports, dancing).  9. Traveling 2 =  My pain restricts my travel over 2 hours.  10. Employment/ Homemaking 3 = Pain prevents me from doing anything but light duties.  Total 29/50  % Disability 58.0 % - Severe   Interpretation of scores: Score Category Description  0-20% Minimal Disability The patient can cope with most living activities. Usually no treatment is indicated apart from advice on lifting, sitting and exercise  21-40% Moderate Disability The patient experiences more pain and difficulty with sitting, lifting and standing. Travel and social life are more difficult and they may be disabled from work. Personal care, sexual activity and sleeping are not grossly affected, and the patient can usually be managed by conservative means  41-60% Severe Disability Pain remains the main problem in this group, but activities of daily living are affected. These patients require a detailed investigation  61-80% Crippled Back pain impinges on all aspects of the patients life. Positive intervention is required  81-100% Bed-bound These patients are either bed-bound or exaggerating their symptoms  Brett Allen, Brett al. Surgery versus conservative management of stable thoracolumbar fracture: the PRESTO feasibility RCT.  [place] ([place]): [employer]; 2021 Nov. [facility] Technology Assessment, No. 25.62.) Appendix 3, Oswestry  Disability Index category descriptors. Available from: [email or web address].cz  Minimally Clinically Important Difference (MCID) = 12.8%    NDI:  NECK DISABILITY INDEX Date:  05/13/24   Pain intensity 4 = The pain is very severe at the moment  2. Personal care (washing, dressing, etc.) 3 =  I need some help but can manage most of my personal care  3. Lifting 3 = Pain prevents me from lifting heavy weights but I can manage light to medium   weights if they are conveniently positioned  4. Reading 4 =  I can hardly read at all because of severe pain in my neck  5. Headaches 3 = I have moderate headaches, which come frequently  6. Concentration 1 =  I can concentrate fully when I want to with slight difficulty   7. Work 3 =  I cannot do my usual work  8. Driving 3 = I can't drive my car as long as I want because of moderate pain in my neck  9. Sleeping 4 = My sleep is greatly disturbed (3-5 hrs sleepless)   10. Recreation 4 =  I can hardly do any recreation activities because of pain in my neck  Total 32/50  % Disability 64.0 % - Severe   Minimum Detectable Change (90% confidence): 5 points or 10% points  SCREENING FOR RED FLAGS: Bowel or bladder incontinence: No Spinal tumors: No Cauda equina syndrome: No Compression fracture: No Abdominal aneurysm: No  COGNITION: Overall cognitive status: Within functional limits for tasks assessed     SENSATION: WFL  POSTURE:  rounded shoulders, forward head, decreased lumbar lordosis, flexed trunk , and weight shift left  PALPATION: TTP with increased muscle tension and TPs in L UT and LS TTP in R lumbar paraspinals and glutes, R>L piriformis  CERVICAL ROM:   Active ROM Eval  Flexion 31  Extension 22 - tight  Right lateral flexion 23  Left lateral flexion 20 p!  Right rotation 36  Left rotation 28 p!    (Blank rows = not tested)  UPPER EXTREMITY ROM:  Active ROM Right eval Left eval  Shoulder flexion 120  149  Shoulder extension 34 36  Shoulder abduction 139 144  Shoulder adduction    Shoulder internal rotation L1 L2  Shoulder external rotation T3 T2    (Blank rows = not tested)  UPPER EXTREMITY MMT:  MMT Right eval Left eval  Shoulder flexion 4+ 4  Shoulder extension 5 5  Shoulder abduction 4+ 4  Shoulder adduction    Shoulder internal rotation 5 5  Shoulder external rotation 4+ 4+  Middle trapezius 4 4  Lower trapezius 3- 3+  (Blank rows = not tested)  LUMBAR ROM:   Active  Eval  Flexion 90% limited  Extension 90% limited  Right lateral flexion Distal femur   Left lateral flexion Lateral femoral condyle   Right rotation 60% limited  Left rotation 60% limited    (Blank rows = not tested)  MUSCLE LENGTH: Hamstrings: mod tight B ITB: mod tight B Piriformis: mod/severe tight B Hip flexors: mod tight B Quads: mod tight B Heelcord:   LOWER EXTREMITY ROM:    Grossly WFL other than limitations due to tightness as above  LOWER EXTREMITY MMT:    MMT Right eval Left eval  Hip flexion 3 3+  Hip extension 2- 2-  Hip abduction 2 2+  Hip adduction 2  2  Hip internal rotation 4 3+  Hip external rotation 4- 4-  Knee flexion 4 4-  Knee extension 4 4  Ankle dorsiflexion 4+ 4  Ankle plantarflexion    Ankle inversion    Ankle eversion      (Blank rows = not tested)  CERVICAL SPECIAL TESTS:  Spurling's test: Negative and Distraction test: Negative  LUMBAR SPECIAL TESTS:  Straight leg raise test: Negative and Slump test: Negative  FUNCTIONAL TESTS:  5 times sit to stand: unable w/o B UE assist; 27.56 sec with B UE assist   TODAY'S TREATMENT:    06/24/2024  GAIT TRAINING: To normalize gait pattern and improve safety with SPC.  180' with SPC - cues to use SPC on L to offload R LE + cues for sequencing of SPC and step pattern to allow for more normal step-through pattern  SELF CARE:   Brief review of proper posture and body mechanics for typical daily positioning,  especially supine positioning for sleeping with pillow behind knees and relatively thin pillow +/- towel roll under neck for neck support.  THERAPEUTIC EXERCISE: To improve strength, endurance, ROM, and flexibility.  Demonstration, verbal and tactile cues throughout for technique.  All exercises performed in sitting with moist heat pack on lumbar spine. Rec Bike - L2 x 6' Seated abdominal bracing with press-down into ball on lap 10 x 5 Seated TrA + scap retraction + GTB rows 2 x 10 Seated TrA + scap retraction + GTB B shoulder ER 2 x 10 Seated GTB pallof press x 15 B Attempted seated 3-way lumbar flexion stretch with UE support on SPC but deferred d/t increased pain   06/16/2024  THERAPEUTIC EXERCISE: To improve strength and endurance.  Demonstration, verbal and tactile cues throughout for technique. Rec Bike - L2 x 6'  NEUROMUSCULAR RE-EDUCATION: To improve coordination, kinesthesia, posture, and proprioception.  Hooklying sciatic nerve glide Hooklying PPT + GTB alt hip ABD/ER bent-knee fallouts 10 x 3-5, 2 sets - cues to control band on return to neutral position GTB hip flexion march 2 x 10 Bridge + GTB hip ABD isometric x 10 - limited lift Deadbug x 10 90/90 sequential march x 5 leading with each LE GTB B scap retraction + shoulder horizontal ABD 10 x 3-5 GTB B scap retraction + shoulder horizontal ABD diagonals 10 x 3-5 Staggered stance + GTB scap retraction + B shoulder row 2 x 10 B shoulder extension 2 x 10 Standing GTB pallof press x 10 B GTB multifidi walkouts x 10 B   06/10/24 Rec Bike L2x75min Bird dog orange pball x 10 Bent over orange pball shoulder extension IASTM with foam roll to low back and glutes Seated lumbar flexion stretch with orange pball Seated rows 15lb x 10 BUE Moist heat to low back in sitting x 15 min   06/03/24  THERAPEUTIC EXERCISE: To improve strength, endurance, ROM, and flexibility.  Demonstration, verbal and tactile cues throughout for  technique.  Rec Bike - L2 x 6' Standing lumbar extension with forearms on wall 10 x 5  MANUAL THERAPY: To promote normalized muscle tension, improved flexibility, and reduced pain utilizing connective tissue massage, therapeutic massage, manual TP therapy, and percussion massage with massage gun.  STM/DTM and percussion massage to R>L UT, LS and scalenes  SELF CARE: Provided education on post-surgical precautions and to facilitate performance of basic household cleaning/chores. Provided education in proper posture and body mechanics for typical daily positioning and household chores to minimize strain on low back  and neck, emphasizing BLT precautions for the immediate post-op period.  NEUROMUSCULAR RE-EDUCATION: To improve coordination, kinesthesia, posture, and proprioception.  Quadruped over orange Pball: Alt LE/hip extension x 10 - VC & TC to maintain neutral spine avoiding trunk rotation Alt UE raises x 10   05/27/24  THERAPEUTIC EXERCISE: To improve strength, endurance, ROM, and flexibility.  Demonstration, verbal and tactile cues throughout for technique.  UBE - L2.0 x 3' each fwd & back Supine R/L HS stretch with strap 1 x 30 with opp LE straight, 3 x 30 with opp knee flexed (preferred) Supine SKTC 3 x 30 bil Hooklying LTR 10 x 5 Hooklying TrA progressing to PPT 10 x 5  NEUROMUSCULAR RE-EDUCATION: To improve coordination, kinesthesia, and posture. Hooklying PPT + GTB B scap retraction + shoulder horizontal ABD 10 x 3-5 GTB alt hip ABD/ER bent-knee fallouts 10 x 3-5 , 2 sets - cues to control band on return to neutral position Attempted bridge + GTB hip ABD isometric but deferred due to increased pain even with limited lift GTB hip flexion march 2 x 10 Hip ADD isometric ball squeeze 10 x 5, 2 sets  SELF CARE: Provided education on post-surgical precautions and to increase independence with ADLs. Provided initial introduction to BLT precautions and review log rolling  and sidelying to sit transitions to reduce low back strain. Reviewed recommended sleeping positions, avoiding prone due to strain on neck and suggesting sidelying with pillow between knees to promote neutral spine alignment.   05/20/24 THERAPEUTIC EXERCISE: To improve strength, endurance, ROM, and flexibility.  UBE L2.0 3 min fwd/ 3 min back Bike L2x46min Seated UT and levator stretches reviewed Seated lumbar flexion stretch touching floor 2x30' Supine LTR both ways 10x5' Supine SKTC R/L 2 x 30' Supine HS stretch 2x30' BLE w/ strap Seated ab sets orange pball 10x5'   05/13/2024 - Eval SELF CARE:  Reviewed eval findings and role of PT in addressing identified deficits as well as instruction in initial HEP (see below).  Patient inquiring about use of estim which is not covered by his [employer], therefore provided information on a home TENS unit for self-purchase.   PATIENT EDUCATION:  Education details: HEP progression - core/lumbopelvic strengthening  Person educated: Patient Education method: Explanation, Demonstration, Verbal cues, Tactile cues, and MedBridgeGO app updated Education comprehension: verbalized understanding, returned demonstration, verbal cues required, tactile cues required, and needs further education  HOME EXERCISE PROGRAM: *Pt using MedBridgeGO app.  Access Code: [email or web address] URL: [email or web address] Date: 06/03/2024 Prepared by: Elijah Hidden  Exercises - Seated Gentle Upper Trapezius Stretch (Mirrored)  - 2-3 x daily - 7 x weekly - 3 reps - 30 sec hold - Gentle Levator Scapulae Stretch  - 2-3 x daily - 7 x weekly - 3 reps - 30 sec hold - Seated 3 Way Exercise Ball Roll Out Stretch  - 2-3 x daily - 7 x weekly - 10 reps - 5 sec hold - Supine Lower Trunk Rotation  - 2-3 x daily - 7 x weekly - 10 reps - 5-10 sec hold - Hooklying Hamstring Stretch with Strap  - 2-3 x daily - 7 x weekly - 3 reps - 30 sec hold - Hooklying Single Knee to  Chest Stretch  - 2-3 x daily - 7 x weekly - 3 reps - 30 sec hold - Supine Posterior Pelvic Tilt  - 1 x daily - 7 x weekly - 2 sets - 10 reps - 5 sec hold - Supine Hip Adduction Isometric  with Ball  - 1 x daily - 7 x weekly - 2 sets - 10 reps - 5 sec hold - Supine Shoulder Horizontal Abduction with Resistance  - 1 x daily - 7 x weekly - 2 sets - 10 reps - 3 sec hold - Hooklying Single Leg Bent Knee Fallouts with Resistance  - 1 x daily - 7 x weekly - 2 sets - 10 reps - 3 sec hold - Supine March with Resistance Band  - 1 x daily - 7 x weekly - 2 sets - 10 reps - 2-3 sec hold hold  Patient Education - TENS Therapy - TENS UNIT - AUVON Dual Channel TENS Unit - Post-Op Back Precautions - Posture and Body Mechanics   ASSESSMENT:  CLINICAL IMPRESSION: Brett Allen reports increased LBP and R LE radicular symptoms for the past few days w/o known trigger.  He requested to focus on activities in sitting due to increased pain but did verbally review proper positioning for sleeping in supine to reduce neck and low back strain.  All exercises performed with moist heat back to low back while sitting with back supported but still with limited tolerance due to R shoulder discomfort/pain as well.  Uvaldo will benefit from continued skilled PT to address ongoing cervical and lumbar ROM/flexibility, postural, core and proximal UE/LE strength deficits to improve mobility and activity tolerance with decreased pain interference.    EVAL: Brett Allen is a 49 y.o. male who was referred to physical therapy for evaluation and treatment for acute L-sided neck pain and chronic R-sided lumbar pain with intermittent R LE radicular pain, numbness and tingling.  He also reports chronic R shoulder pain and limited ROM following surgery ~1 yr ago s/p chronic dislocations.   Patient reports onset of L-sided neck pain beginning ~1 month ago while brushing his teeth.  Neck pain is intermittent, worse with sleeping positions and  turning his head.  Patient reports onset of R-sided low back pain 17+ years ago, exacerbated 2-3 yrs ago with a job requiring repetitive bending, lifting and twisting. Low back pain is chronic and constant, with electric shock down R LE to knee and ankle starting ~2 months ago, worse with prolonged sitting or standing.  Patient has deficits in cervical and lumber ROM, B LE flexibility, core/postural and B UE/LE strength, abnormal posture, and TTP with abnormal muscle tension which are interfering with ADLs and are impacting quality of life.  On NDI patient scored 32/50 demonstrating 64% or severe disability.  On Modified Oswestry patient scored 29/50 demonstrating 58% or severe disability.  Trace will benefit from skilled PT to address above deficits to improve mobility and activity tolerance with decreased pain interference.  OBJECTIVE IMPAIRMENTS: Abnormal gait, decreased activity tolerance, decreased endurance, decreased knowledge of condition, decreased mobility, difficulty walking, decreased ROM, decreased strength, hypomobility, increased fascial restrictions, impaired perceived functional ability, increased muscle spasms, impaired flexibility, impaired sensation, impaired UE functional use, improper body mechanics, postural dysfunction, and pain.   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   REHAB POTENTIAL: Good  CLINICAL  DECISION MAKING: Unstable/unpredictable  EVALUATION COMPLEXITY: High  GOALS: Goals reviewed with patient? Yes  SHORT TERM GOALS: Target date: 06/10/2024  Patient will be independent with initial HEP to improve outcomes and carryover.  Baseline: HEP initiated on eval Goal status: MET - 05/27/24  2.  Patient will report 25% improvement in neck and low back pain to improve QOL. Baseline: Neck = 5/10 & Low back = 7/10 on eval, both up to 10/10 at worst 06/03/24 - pain essentially unchanged,except neck pain slightly worse (likely triggered by sleeping in prone) Goal status: PARTIALLY MET - 06/16/24 - neck 50% improved but low back pain essentially unchanged  3.  Patient will demonstrate improved posture to decrease muscle imbalance. Baseline: forward head, rounded shoulders, decreased lumbar lordosis, flexed trunk, and weight shift left 06/03/24 - improving posture in standing but still with tendency for fwd head, rounded shoulders and increased trunk flexion in sitting Goal status: MET - 06/16/24 - better postural awareness with benefit noted from wearing LSO  LONG TERM GOALS: Target date: 07/08/2024  Patient will be independent with ongoing/advanced HEP for self-management at home.  Baseline:  Goal status: IN PROGRESS - 06/16/24  2.  Patient will report 50-75% improvement in neck and low back pain to improve QOL.  Baseline: Neck = 5/10 & Low back = 7/10 on eval, both up to 10/10 at worst Goal status: IN PROGRESS - 06/16/24 - neck 50% improved but low back pain essentially unchanged  3.  Patient to demonstrate ability to achieve and maintain good spinal alignment and body mechanics needed for daily activities. Baseline:  06/03/24 - education provided in proper posture and body mechanics for typical daily positioning and household chores to minimize strain on low back and neck as well as post-op BLT precautions  Goal status: IN PROGRESS - 06/16/24 - better postural awareness with  benefit noted from wearing LSO, pt reports he has been trying to incorporate the posture and body mechanics training in to his daily mobility and sleeping positions  4.  Patient will demonstrate functional pain free cervical ROM for safety with driving.  Baseline: Refer to above cervical ROM table Goal status: INITIAL  5.  Patient will demonstrate functional pain free lumbar ROM to perform ADLs.   Baseline: Refer to above lumbar ROM table Goal status: INITIAL  7.  Patient will demonstrate improved functional strength as demonstrated by improved B postural and UE/LE strength to >/= 4 to 4+/5. Baseline: Refer to above UE/LE MMT tables Goal status: INITIAL  6.  Patient will report </= 54% on NDI (MCID = 10%) to demonstrate improved functional ability.  Baseline: 32 / 50 = 64.0 % Goal status: INITIAL   7.  Patient will report </= 46% on Modified Oswestry (MCID = 12%) to demonstrate improved functional ability.  Baseline: 29 / 50 = 58.0 % Goal status: INITIAL  8.  Patient to report ability to perform ADLs, household, and work-related tasks without need for assistance of his wife. Baseline: needs assistance of wife with washing his back and most household chores Goal status: INITIAL   9.  Patient will tolerate >30 min of sitting or standing w/o increase in pain or radicular symptoms to allow for increased ease of ADL performance. Baseline: Back pain prevents sitting or standing for >30 minutes Goal status: INITIAL  10.  Patient will report centralization of R LE radicular symptoms.  Baseline: electric shock down R LE to knee and ankle Goal status: INITIAL   PLAN:  PT FREQUENCY: 2x/week  PT DURATION: 8 weeks  PLANNED  INTERVENTIONS: 02835- PT Re-evaluation, 97750- Physical Performance Testing, 97110-Therapeutic exercises, 97530- Therapeutic activity, [id number]- Neuromuscular re-education, [phone]- Self Care, 02859- Manual therapy, [phone]- Gait training, [phone]- Aquatic Therapy, [phone]-  Ultrasound, [phone] (1-2 muscles), [phone] (3+ muscles)- Dry Needling, Patient/Family education, Balance training, Stair training, Taping, Joint mobilization, Spinal mobilization, Cryotherapy, and Moist heat  PLAN FOR NEXT SESSION: gently progress cervical and lumbar ROM/stretching; review & progress HEP PRN; MT +/- TPDN to address TPs/abnormal muscle tension in L UT, lumbar paraspinals, glues and piriformis; review posture & body mechanics education and BLT precautions PRN    Elijah CHRISTELLA Hidden, PT 06/24/2024, 10:13 AM  "

## 2024-06-24 NOTE — Progress Notes (Signed)
 "  Subjective:    Patient ID: Brett Allen, male    DOB: [birth year], 49 y.o.   MRN: [id number]  Chief Complaint  Patient presents with   Sciatica    HPI Patient is in today for sciatica pain.  Discussed the use of AI scribe software for clinical note transcription with the patient, who gave verbal consent to proceed.  History of Present Illness Brett Allen is a 49 year old male with lumbar stenosis and disc bulging who presents with worsening sciatica pain.  He has been experiencing sharp burning pain radiating from the crease to his ankle on the right side for the past four days. This pain is persistent and severe, significantly affecting his daily activities. He describes the pain as 'terrible'.  He has a history of sciatica and lumbar back pain diagnosed in 2017. An MRI was performed in November. He was scheduled for lumbar fusion surgery with four rods to be placed, but the surgery was denied by Medicaid. He is awaiting further information on whether an appeal will be made.  He has been using a cane due to the pain and difficulty with prolonged sitting and standing. He has undergone physical therapy twice a week, but the pain has limited his ability to participate fully. He learned to use the cane during physical therapy.  He is currently taking tramadol , which he plans to pick up on Friday, and has previously used prednisone , which is no longer effective. He ran out of Flexeril  two weeks ago, which he found helpful for sleep and muscle relaxation. He has tried ice and heat for pain relief, noting that heat helps more than ice. He wants a nerve block or steroid injection to manage the pain.    Past Medical History:  Diagnosis Date   Back pain    lower   Cellulitis    Diabetes mellitus without complication (HCC)    Hypertension    Sarcoidosis    right eye    Past Surgical History:  Procedure Laterality Date   left wrsit surgery Left    WRIST FRACTURE SURGERY  Left 2017    Family History  Problem Relation Age of Onset   Hypertension Mother    Diabetes Mother    Hypertension Brother     Social History   Socioeconomic History   Marital status: Married    Spouse name: Not on file   Number of children: Not on file   Years of education: Not on file   Highest education level: 12th grade  Occupational History   Not on file  Tobacco Use   Smoking status: Former    Types: Cigarettes   Smokeless tobacco: Never  Vaping Use   Vaping status: Never Used  Substance and Sexual Activity   Alcohol use: Not Currently    Comment: occassionally    Drug use: No   Sexual activity: Yes    Partners: Female    Comment: married 18 years.  Other Topics Concern   Not on file  Social History Narrative   Not on file   Social Drivers of Health   Tobacco Use: Medium Risk (06/24/2024)   Patient History    Smoking Tobacco Use: Former    Smokeless Tobacco Use: Never    Passive Exposure: Not on file  Financial Resource Strain: Low Risk (04/21/2024)   Overall Financial Resource Strain (CARDIA)    Difficulty of Paying Living Expenses: Not hard at all  Food Insecurity: Food Insecurity Present (04/21/2024)  Epic    Worried About Programme Researcher, Broadcasting/film/video in the Last Year: Sometimes true    [employer] of Food in the Last Year: Often true  Transportation Needs: No Transportation Needs (04/21/2024)   Epic    Lack of Transportation (Medical): No    Lack of Transportation (Non-Medical): No  Physical Activity: Sufficiently Active (04/21/2024)   Exercise Vital Sign    Days of Exercise per Week: 3 days    Minutes of Exercise per Session: 50 min  Stress: Stress Concern Present (04/21/2024)   [employer] of Occupational Health - Occupational Stress Questionnaire    Feeling of Stress: To some extent  Social Connections: Moderately Integrated (04/21/2024)   Social Connection and Isolation Panel    Frequency of Communication with Friends and Family: Once a week     Frequency of Social Gatherings with Friends and Family: Once a week    Attends Religious Services: More than 4 times per year    Active Member of [employer] or Organizations: Yes    Attends Banker Meetings: More than 4 times per year    Marital Status: Married  Catering Manager Violence: Not At Risk (10/29/2022)   Humiliation, Afraid, Rape, and Kick questionnaire    Fear of Current or Ex-Partner: No    Emotionally Abused: No    Physically Abused: No    Sexually Abused: No  Depression (PHQ2-9): Low Risk (04/22/2024)   Depression (PHQ2-9)    PHQ-2 Score: 4  Alcohol Screen: Low Risk (04/21/2024)   Alcohol Screen    Last Alcohol Screening Score (AUDIT): 0  Housing: Unknown (04/21/2024)   Epic    Unable to Pay for Housing in the Last Year: No    Number of Times Moved in the Last Year: Not on file    Homeless in the Last Year: No  Utilities: Low Risk (04/02/2023)   Received from [facility]   Utilities    In the past 12 months has the electric, gas, oil, or water company threatened to shut off services in your home? : No  Health Literacy: Not on file    Outpatient Medications Prior to Visit  Medication Sig Dispense Refill   amLODipine  (NORVASC ) 10 MG tablet Take 1 tablet (10 mg total) by mouth daily. 90 tablet 0   buPROPion (WELLBUTRIN XL) 150 MG 24 hr tablet Take 150 mg by mouth every morning.     clindamycin  (CLEOCIN ) 150 MG capsule Take 1 capsule (150 mg total) by mouth 3 (three) times daily. 30 capsule 0   gabapentin  (NEURONTIN ) 100 MG capsule Take 1 capsule (100 mg total) by mouth 3 (three) times daily. 90 capsule 0   ibuprofen  (ADVIL ) 800 MG tablet Take 1 tablet (800 mg total) by mouth every 8 (eight) hours as needed. 30 tablet 1   mupirocin  ointment (BACTROBAN ) 2 % Apply 1 Application topically 2 (two) times daily. 22 g 2   potassium chloride  (KLOR-CON  M) 10 MEQ tablet Take 1 tablet (10 mEq total) by mouth daily. 30 tablet 2   Semaglutide ,0.25 or 0.5MG /DOS, (OZEMPIC ,  0.25 OR 0.5 MG/DOSE,) 2 MG/3ML SOPN Inject 0.5 mg into the skin once a week. 3 mL 0   traMADol  (ULTRAM ) 50 MG tablet Take 1 tablet (50 mg total) by mouth at bedtime as needed for pain. 30 tablet 0   valsartan  (DIOVAN ) 320 MG tablet Take 1 tablet (320 mg total) by mouth daily. 90 tablet 0   cyclobenzaprine  (FLEXERIL ) 10 MG tablet Take 1 tablet (10  mg total) by mouth at bedtime as needed. 3 tablet 0   No facility-administered medications prior to visit.    Allergies[1]  Review of Systems  Constitutional:  Negative for chills, fever and malaise/fatigue.  HENT:  Negative for congestion and hearing loss.   Eyes:  Negative for discharge.  Respiratory:  Negative for cough, sputum production and shortness of breath.   Cardiovascular:  Negative for chest pain, palpitations and leg swelling.  Gastrointestinal:  Negative for abdominal pain, blood in stool, constipation, diarrhea, heartburn, nausea and vomiting.  Genitourinary:  Negative for dysuria, frequency, hematuria and urgency.  Musculoskeletal:  Positive for back pain. Negative for falls and myalgias.  Skin:  Negative for rash.  Neurological:  Negative for dizziness, sensory change, loss of consciousness, weakness and headaches.  Endo/Heme/Allergies:  Negative for environmental allergies. Does not bruise/bleed easily.  Psychiatric/Behavioral:  Negative for depression and suicidal ideas. The patient is not nervous/anxious and does not have insomnia.        Objective:    Physical Exam Vitals and nursing note reviewed.  Constitutional:      General: He is not in acute distress.    Appearance: Normal appearance. He is well-developed.  HENT:     Head: Normocephalic and atraumatic.     Right Ear: Tympanic membrane, ear canal and external ear normal. There is no impacted cerumen.     Left Ear: Tympanic membrane, ear canal and external ear normal. There is no impacted cerumen.     Nose: Nose normal.     Mouth/Throat:     Mouth: Mucous  membranes are moist.     Pharynx: Oropharynx is clear. No oropharyngeal exudate or posterior oropharyngeal erythema.  Eyes:     General: No scleral icterus.       Right eye: No discharge.        Left eye: No discharge.     Conjunctiva/sclera: Conjunctivae normal.     Pupils: Pupils are equal, round, and reactive to light.  Neck:     Thyroid : No thyromegaly.     Vascular: No JVD.  Cardiovascular:     Rate and Rhythm: Normal rate and regular rhythm.     Heart sounds: Normal heart sounds. No murmur heard. Pulmonary:     Effort: Pulmonary effort is normal. No respiratory distress.     Breath sounds: Normal breath sounds.  Abdominal:     General: Bowel sounds are normal. There is no distension.     Palpations: Abdomen is soft. There is no mass.     Tenderness: There is no abdominal tenderness. There is no guarding or rebound.  Musculoskeletal:        General: Normal range of motion.     Cervical back: Normal range of motion and neck supple.     Right lower leg: No edema.     Left lower leg: No edema.  Lymphadenopathy:     Cervical: No cervical adenopathy.  Skin:    General: Skin is warm and dry.     Findings: No erythema or rash.  Neurological:     Mental Status: He is alert and oriented to person, place, and time.     Cranial Nerves: No cranial nerve deficit.     Sensory: Sensory deficit present.     Motor: No abnormal muscle tone.     Deep Tendon Reflexes: Reflexes are normal and symmetric. Reflexes normal.     Comments: Numbness/ burning R knee to ankle Weakness R knee ext   Psychiatric:  Mood and Affect: Mood normal.        Behavior: Behavior normal.        Thought Content: Thought content normal.        Judgment: Judgment normal.     BP 100/72 (BP Location: Right Arm, Patient Position: Sitting, Cuff Size: Large)   Pulse 93   Temp 98.1 F (36.7 C) (Oral)   Resp 18   Ht 5' 10 (1.778 m)   Wt 218 lb 6.4 oz (99.1 kg)   SpO2 96%   BMI 31.34 kg/m  Wt Readings  from Last 3 Encounters:  06/24/24 218 lb 6.4 oz (99.1 kg)  06/18/24 214 lb 9.6 oz (97.3 kg)  06/09/24 213 lb 9.6 oz (96.9 kg)    Diabetic Foot Exam - Simple   No data filed    Lab Results  Component Value Date   WBC 9.4 03/21/2023   HGB 14.7 03/21/2023   HCT 43.9 03/21/2023   PLT 239 03/21/2023   GLUCOSE 107 (H) 06/09/2024   CHOL 208 (H) 02/02/2023   TRIG 212 (H) 02/02/2023   HDL 47 02/02/2023   LDLCALC 126 (H) 02/02/2023   ALT 19 06/09/2024   AST 20 06/09/2024   NA 139 06/09/2024   K 3.6 06/09/2024   CL 102 06/09/2024   CREATININE 1.25 06/09/2024   BUN 12 06/09/2024   CO2 28 06/09/2024   HGBA1C 6.2 (H) 04/22/2024   MICROALBUR 3.3 (H) 04/22/2024    No results found for: TSH Lab Results  Component Value Date   WBC 9.4 03/21/2023   HGB 14.7 03/21/2023   HCT 43.9 03/21/2023   MCV 75.8 (L) 03/21/2023   PLT 239 03/21/2023   Lab Results  Component Value Date   NA 139 06/09/2024   K 3.6 06/09/2024   CO2 28 06/09/2024   GLUCOSE 107 (H) 06/09/2024   BUN 12 06/09/2024   CREATININE 1.25 06/09/2024   BILITOT 0.4 06/09/2024   ALKPHOS 77 06/09/2024   AST 20 06/09/2024   ALT 19 06/09/2024   PROT 7.3 06/09/2024   ALBUMIN 4.3 06/09/2024   CALCIUM  9.4 06/09/2024   ANIONGAP 15 03/21/2023   GFR 69.27 06/09/2024   Lab Results  Component Value Date   CHOL 208 (H) 02/02/2023   Lab Results  Component Value Date   HDL 47 02/02/2023   Lab Results  Component Value Date   LDLCALC 126 (H) 02/02/2023   Lab Results  Component Value Date   TRIG 212 (H) 02/02/2023   Lab Results  Component Value Date   CHOLHDL 4.4 02/02/2023   Lab Results  Component Value Date   HGBA1C 6.2 (H) 04/22/2024       Assessment & Plan:  Lumbar radicular pain -     methylPREDNISolone  Acetate  Spinal stenosis of lumbar region with neurogenic claudication -     predniSONE ; Take 3 tablets (30 mg total) by mouth daily for 7 days, THEN 2 tablets (20 mg total) daily for 7 days, THEN 1  tablet (10 mg total) daily for 7 days.  Dispense: 42 tablet; Refill: 0 -     Cyclobenzaprine  HCl; Take 1 tablet (10 mg total) by mouth at bedtime as needed.  Dispense: 3 tablet; Refill: 0 -     methylPREDNISolone  Acetate  Assessment and Plan Assessment & Plan Lumbar spinal stenosis with radiculopathy   Chronic lumbar spinal stenosis with radiculopathy presents with sharp burning pain from the crease to the ankle. He has a history of lumbar fusion surgery  with persistent leg numbness. Currently, pain exacerbation requires cane use for ambulation. Surgery is denied due to Medicaid issues. Physical therapy continues but does not significantly alleviate symptoms. Previously managed with prednisone  and Flexeril . Considering epidural steroid injection if surgery is delayed. Administered intramuscular steroid injection. Start prednisone  the following night. Flexeril  prescription refilled. Advised to contact the surgeon's office about rescheduling surgery and potential epidural steroid injection. Discuss pain management options with the surgeon if surgery is delayed.    Sharone Almond R Lowne Chase, DO    [1] No Known Allergies  "

## 2024-06-26 ENCOUNTER — Other Ambulatory Visit (HOSPITAL_BASED_OUTPATIENT_CLINIC_OR_DEPARTMENT_OTHER): Payer: Self-pay

## 2024-06-26 ENCOUNTER — Ambulatory Visit

## 2024-06-27 ENCOUNTER — Ambulatory Visit: Admitting: Medical

## 2024-06-27 ENCOUNTER — Ambulatory Visit: Admitting: Student

## 2024-06-30 ENCOUNTER — Ambulatory Visit: Admitting: Physical Therapy

## 2024-06-30 ENCOUNTER — Encounter: Payer: Self-pay | Admitting: Physical Therapy

## 2024-06-30 DIAGNOSIS — R2689 Other abnormalities of gait and mobility: Secondary | ICD-10-CM

## 2024-06-30 DIAGNOSIS — M6281 Muscle weakness (generalized): Secondary | ICD-10-CM

## 2024-06-30 DIAGNOSIS — M542 Cervicalgia: Secondary | ICD-10-CM

## 2024-06-30 DIAGNOSIS — G8929 Other chronic pain: Secondary | ICD-10-CM

## 2024-06-30 DIAGNOSIS — R293 Abnormal posture: Secondary | ICD-10-CM

## 2024-06-30 NOTE — Therapy (Signed)
 " OUTPATIENT PHYSICAL THERAPY TREATMENT  Progress Note  Reporting Period 05/13/2024 to 06/30/2024  See note below for Objective Data and Assessment of Progress/Goals.     Patient Name: Brett Allen MRN: [id number] DOB:01/25/1976, 49 y.o., male Today's Date: 06/30/2024   END OF SESSION:  PT End of Session - 06/30/24 1013     Visit Number 8    Date for Recertification  07/08/24    Authorization Type [employer]    Authorization Time Period 05/13/24 - 08/11/24    Authorization - Visit Number 8    Authorization - Number of Visits 14    PT Start Time 1013    PT Stop Time 1101    PT Time Calculation (min) 48 min    Activity Tolerance Patient tolerated treatment well    Behavior During Therapy WFL for tasks assessed/performed            Past Medical History:  Diagnosis Date   Back pain    lower   Cellulitis    Diabetes mellitus without complication (HCC)    Hypertension    Sarcoidosis    right eye   Past Surgical History:  Procedure Laterality Date   left wrsit surgery Left    WRIST FRACTURE SURGERY Left 2017   Patient Active Problem List   Diagnosis Date Noted   Cellulitis 10/27/2022   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   Chronic bilateral low back pain without sciatica 07/23/2015   Hyperlipidemia 07/09/2015    PCP: Dorina Loving, PA-C   REFERRING PROVIDER: Gillie Duncans, MD   REFERRING DIAG: M54.2 (ICD-10-CM) - Cervicalgia  Lumbar & Cervical pain  THERAPY DIAG:  Cervicalgia  Chronic right-sided low back pain with right-sided sciatica  Muscle weakness (generalized)  Abnormal posture  Other abnormalities of gait and mobility  RATIONALE FOR EVALUATION AND TREATMENT: Rehabilitation  ONSET DATE: 04/27/24 - Neck pain;  Chronic - Lumbar pain  NEXT MD VISIT: 07/01/24   SUBJECTIVE:                                                                                                                                                                                                          SUBJECTIVE STATEMENT: Pt reports his PCP gave him prednisone  and injection in his buttock but he didn't note much relief.  He is still scheduled to see Dr. Gillie tomorrow, 07/01/24.  He arrives to PT today with a home TENS unit he purchased.  EVAL: Pt reports continued limitations in R shoulder since his surgery on 05/15/23.  He reports he  reinjured his shoulder during work hardening/conditioning while preparing to return to work.  He states he has been given a 15% disability because of this which has kept him out of work.  His neck pain has been bad for about a 1 month, triggered as he went to brush his teeth one morning.  Pain comes and goes but throbs all day long when present, limiting his ability to turn his head and chew.  Low back pain has been chronic for 17+ years (since moving to [place] in 2008).  Has h/o chiropractor work for his back - seen 3-4x in past month with ~3 visits remaining.  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  Feels like he is crooked and leaning forward when he walks.   PAIN: Are you having pain? Yes: NPRS scale: 7/10  Pain location: midline low back to R, electric shock down R LE to knee and ankle starting ~2 months ago (trigger by prolonged sitting and standing)  Pain description: stabbing, locks up at times  Aggravating factors: transitional movements, prolonged sitting or standing, rising from bed in the morning  Relieving factors: meds take the edge off, heat, chiropractor - nothing really lasts   Are you having pain? Yes: NPRS scale: 6-7/10  Pain location: R lateral neck & upper shoulder  Pain description: throbbing on a normal day, sharp stabbing on bad day with massive headache  Aggravating factors: turning head to L, lying in awkward positions (esp on L side)   Relieving factors: heat, compression,  meds (flexeril  at night, ibuprofen )   Are you having pain? Yes: NPRS scale: 7-9/10  Pain location: R anterior shoulder  Pain description: stinging, crunching, occasional numbness, TTP, throbbing  Aggravating factors: end range ER, lifting, sports  Relieving factors: ice, ibuprofen    PERTINENT HISTORY:  R shoulder surgery 05/15/23, chronic R shoulder dislocation/subluxation, excision of neck tumor 04/02/23, DM, HTN, sarcoidosis, chronic LBP    Lumbar fusion surgery scheduled for 07/02/2024  PRECAUTIONS: None  HAND DOMINANCE: Right  RED FLAGS: None  WEIGHT BEARING RESTRICTIONS: No  FALLS:  Has patient fallen in last 6 months? No  LIVING ENVIRONMENT: Lives with: lives with their family Lives in: House Stairs: Yes: Internal: 10-12 steps; on right going up and External: 1 steps; none Has following equipment at home: None  OCCUPATION: Unemployed  PLOF: Independent with gait, Independent with transfers, Needs assistance with ADLs, and Leisure: walking; play video games, play basketball - currently unable  PATIENT GOALS: Some type of relief of my pain.    OBJECTIVE:   DIAGNOSTIC FINDINGS:  Neck x-ray unavailable  05/06/24 - MRI LUMBAR SPINE CLINICAL HISTORY: 49 year old male with spondylolisthesis, lumbar region.   FINDINGS:   BONES AND ALIGNMENT: Normal lumbar segmentation on the comparison radiographs. Stable lordosis since 2023. Mild degenerative retrolisthesis from L3-L4 through L5-S1. Normal background bone marrow signal. Chronic degenerative endplate marrow signal changes at L3-L4 and L4-L5, with superimposed degenerative appearing left lateral endplate marrow edema (left greater than right) at L3-L4 (series 6 image 11). No associated paraspinal soft tissue inflammation. No other marrow edema.   SPINAL CORD: Normal conus medullaris at L1-L2. No signal abnormality in the visible lower thoracic spinal cord or conus.   SOFT TISSUES: No paraspinal mass.    DEGENERATIVE:   Visible lower thoracic spine through L2-L3 are negative. L3-L4: Chronic severe disc space loss. Circumferential disc osteophyte complex. Degenerative appearing endplate marrow  edema. Mild to moderate facet and ligamentum flavum hypertrophy. No spinal or lateral recess stenosis. Mild to moderate bilateral L3 neural foraminal stenosis appears stable. L4-L5: Similar advanced chronic disc degeneration and disc space loss. Circumferential disc bulge with mild endplate spurring. Mild posterior element hypertrophy. No spinal or lateral recess stenosis. Moderate left and mild right L4 neural foraminal stenosis (series 5 image 11 on the left). This level appears stable. L5-S1: Mild chronic retrolisthesis with better preserved disc height and disc signal. Mild circumferential disc bulge. Mild to moderate facet hypertrophy. No spinal or lateral recess stenosis. Mild to moderate bilateral L5 neural foraminal stenosis. This level is stable.   IMPRESSION: 1. Very age-advanced lumbar disc and endplate degeneration at L3-L4 and L4-L5, with superimposed mild degenerative-appearing endplate marrow edema at the former. Mild degenerative retrolisthesis at those levels and L5-S1. 2. But no significant lumbar spinal or lateral recess stenosis. And up to moderate associated neural foraminal stenosis at those levels does not appear significantly changed from a 2023 MRI.Lumbar MRI pending  04/22/24 - DG LUMBAR SPINE - 2-3 VIEW CLINICAL DATA:  Lumbar radicular pain on the right   FINDINGS: There is no evidence of lumbar spine fracture. Degenerative changes of the spine predominantly involving the L3-L4 and, L4-L5 and L5-S1 with associated decreased intervertebral disc space and facet hypertrophy. Grade 1 anterolisthesis of L4-L5.   Soft tissues are unremarkable.   IMPRESSION: Degenerative disc disease involving L3- L4 through L5-S1.   Grade 1 anterolisthesis of L4-L5.  01/07/24 - MRI  RIGHT SHOULDER WITHOUT CONTRAST, 01/07/2024 12:08 PM  INDICATION: Pain in right shoulder \ M25.511 Pain in right shoulder ,   FINDINGS:   . Acromioclavicular joint: Mild degenerative changes. No effusion or malalignment.  .  Acromion: No subacromial spurring. Mild subacromial subdeltoid bursitis.  .  Supraspinatus tendon: Mild tendinosis.  .  Infraspinatus tendon: Mild tendinosis.  .  Subscapularis tendon: Intact.  .  Teres minor tendon: Intact.  .  Long head of biceps: Mild tendinosis above the bicipital groove.  .  Glenohumeral joint: No effusion or malalignment.  .  Labrum: Postsurgical changes related to anterior inferior labral repair/Bankart. Linear T2 signal within the posterior inferior labrum [series 3 image 14-15, series 7 image 16] favors to represent postsurgical changes rather than a labral tear, for correlation with operative notes.  .  Bones: Bony remodeling within the anterior-inferior glenoid without edema like signal, likely a sequelae of anterior shoulder dislocation [series 8 image 16, series 4 image 18]. Remote Hill-Sachs fracture. Normal marrow signal. No fracture, neoplasm, or avascular necrosis.  .  Additional comments: No muscle atrophy.   IMPRESSION:  1. Remodeling of the anterior inferior glenoid likely related to remote bony Bankart fracture and remote Hill-Sachs fracture, likely a sequelae of prior anterior shoulder dislocation.  2.  Postsurgical changes related to labral repair.  3.  Linear T2 signal within the posterior inferior labrum, favors to represent postsurgical changes, for correlation with operative notes.  4.  Mild tendinosis supraspinatus, infraspinatus and long head of biceps.  5.  Mild subacromial/subdeltoid bursitis.  6.  Mild AC joint degenerative changes.   04/07/22 - MRI LUMBAR SPINE WITHOUT CONTRAST  CLINICAL DATA:  Initial evaluation for lumbar degenerative disc disease.   FINDINGS:  Segmentation: Standard. Lowest well-formed disc space  labeled the  L5-S1 level.   Alignment: 3 mm degenerative retrolisthesis of L4 on L5, with trace  2 mm retrolisthesis of L3 on L4. Alignment otherwise normal with  preservation of the normal  lumbar lordosis.   Vertebrae: Vertebral body height maintained without acute or chronic  fracture. Bone marrow signal intensity within normal limits. No  worrisome osseous lesions. Discogenic reactive endplate changes  present about the L3-4 and L4-5 interspaces. No other abnormal  marrow edema.   Conus medullaris and cauda equina: Conus extends to the L2 level.  Conus and cauda equina appear normal.   Paraspinal and other soft tissues: Unremarkable.   Disc levels:   L1-2:  Unremarkable.   L2-3:  Unremarkable.   L3-4: Degenerative intervertebral disc space narrowing with  circumferential disc bulge and disc desiccation. Associated reactive  endplate change with marginal endplate osteophytic spurring. Mild  facet hypertrophy. No significant spinal stenosis. Mild bilateral L3  foraminal narrowing. No frank impingement.   L4-5: Degenerative with abscesses narrowing with diffuse disc bulge  and disc desiccation. Associated reactive endplate spurring. Mild  facet and ligament flavum hypertrophy. No significant spinal  stenosis. Mild right with mild to moderate left L4 foraminal  stenosis.   L5-S1: Mild diffuse disc bulge with endplate spurring. Mild  bilateral facet hypertrophy. No spinal stenosis. Mild bilateral L5  foraminal narrowing. No frank impingement.   IMPRESSION:  1. Lower lumbar degenerative disc disease at L3-4 through L5-S1 with  resultant mild to moderate bilateral L3 through L5 foraminal  stenosis as above. No significant spinal stenosis or overt neural  impingement.  2. Mild bilateral facet hypertrophy at L3-4 through L5-S1.   PATIENT SURVEYS:  Oswestry Low Back Pain Disability Questionnaire:  MODIFIED OSWESTRY DISABILITY SCALE Date:  05/13/24   Pain intensity 5 =   Pain medication has no effect on my pain.  2. Personal care (washing, dressing, etc.) 3 =  I need help, but I am able to manage most of my personal care.  3. Lifting 2 = Pain prevents me from lifting heavy weights off the floor,but I can manage if the weights are conveniently positioned (e.g. on a table)  4. Walking 2 =  Pain prevents me from walking more than  mile.  5. Sitting 3 =  Pain prevents me from sitting more than  hour.  6. Standing 3 =  Pain prevents me from standing more than 1/2 hour.  7. Sleeping 4 =  Even when I take pain medication, I sleep less than 2 hour  8. Social Life 2 = Pain prevents me from participating in more energetic activities (eg. sports, dancing).  9. Traveling 2 =  My pain restricts my travel over 2 hours.  10. Employment/ Homemaking 3 = Pain prevents me from doing anything but light duties.  Total 29/50  % Disability 58.0 % - Severe   Interpretation of scores: Score Category Description  0-20% Minimal Disability The patient can cope with most living activities. Usually no treatment is indicated apart from advice on lifting, sitting and exercise  21-40% Moderate Disability The patient experiences more pain and difficulty with sitting, lifting and standing. Travel and social life are more difficult and they may be disabled from work. Personal care, sexual activity and sleeping are not grossly affected, and the patient can usually be managed by conservative means  41-60% Severe Disability Pain remains the main problem in this group, but activities of daily living are affected. These patients require a detailed investigation  61-80% Crippled Back pain impinges on all aspects of the patients life. Positive intervention is required  81-100% Bed-bound These patients are either bed-bound or exaggerating their symptoms  Bluford BRAVO, Zoe DELENA Karon DELENA, et al.  Surgery versus conservative management of stable thoracolumbar fracture: the PRESTO feasibility RCT. [place]  ([place]): [employer]; 2021 Nov. [facility] Technology Assessment, No. 25.62.) Appendix 3, Oswestry Disability Index category descriptors. Available from: Findjewelers.cz  Minimally Clinically Important Difference (MCID) = 12.8%    NDI:  NECK DISABILITY INDEX Date:  05/13/24   Pain intensity 4 = The pain is very severe at the moment  2. Personal care (washing, dressing, etc.) 3 =  I need some help but can manage most of my personal care  3. Lifting 3 = Pain prevents me from lifting heavy weights but I can manage light to medium   weights if they are conveniently positioned  4. Reading 4 =  I can hardly read at all because of severe pain in my neck  5. Headaches 3 = I have moderate headaches, which come frequently  6. Concentration 1 =  I can concentrate fully when I want to with slight difficulty   7. Work 3 =  I cannot do my usual work  8. Driving 3 = I can't drive my car as long as I want because of moderate pain in my neck  9. Sleeping 4 = My sleep is greatly disturbed (3-5 hrs sleepless)   10. Recreation 4 =  I can hardly do any recreation activities because of pain in my neck  Total 32/50  % Disability 64.0 % - Severe   Minimum Detectable Change (90% confidence): 5 points or 10% points  SCREENING FOR RED FLAGS: Bowel or bladder incontinence: No Spinal tumors: No Cauda equina syndrome: No Compression fracture: No Abdominal aneurysm: No  COGNITION: Overall cognitive status: Within functional limits for tasks assessed     SENSATION: WFL  POSTURE:  rounded shoulders, forward head, decreased lumbar lordosis, flexed trunk , and weight shift left  PALPATION: TTP with increased muscle tension and TPs in L UT and LS TTP in R lumbar paraspinals and glutes, R>L piriformis  CERVICAL ROM:   Active ROM Eval  Flexion 31  Extension 22 - tight  Right lateral flexion 23  Left lateral flexion 20 p!  Right rotation 36  Left rotation 28 p!    (Blank  rows = not tested)  UPPER EXTREMITY ROM:  Active ROM Right eval Left eval  Shoulder flexion 120 149  Shoulder extension 34 36  Shoulder abduction 139 144  Shoulder adduction    Shoulder internal rotation L1 L2  Shoulder external rotation T3 T2    (Blank rows = not tested)  UPPER EXTREMITY MMT:  MMT Right eval Left eval  Shoulder flexion 4+ 4  Shoulder extension 5 5  Shoulder abduction 4+ 4  Shoulder adduction    Shoulder internal rotation 5 5  Shoulder external rotation 4+ 4+  Middle trapezius 4 4  Lower trapezius 3- 3+  (Blank rows = not tested)  LUMBAR ROM:   Active  Eval  Flexion 90% limited  Extension 90% limited  Right lateral flexion Distal femur   Left lateral flexion Lateral femoral condyle   Right rotation 60% limited  Left rotation 60% limited    (Blank rows = not tested)  MUSCLE LENGTH: Hamstrings: mod tight B ITB: mod tight B Piriformis: mod/severe tight B Hip flexors: mod tight B Quads: mod tight B Heelcord:   LOWER EXTREMITY ROM:    Grossly WFL other than limitations due to tightness as above  LOWER EXTREMITY MMT:    MMT Right eval Left eval  Hip flexion 3 3+  Hip  extension 2- 2-  Hip abduction 2 2+  Hip adduction 2 2  Hip internal rotation 4 3+  Hip external rotation 4- 4-  Knee flexion 4 4-  Knee extension 4 4  Ankle dorsiflexion 4+ 4  Ankle plantarflexion    Ankle inversion    Ankle eversion      (Blank rows = not tested)  CERVICAL SPECIAL TESTS:  Spurling's test: Negative and Distraction test: Negative  LUMBAR SPECIAL TESTS:  Straight leg raise test: Negative and Slump test: Negative  FUNCTIONAL TESTS:  5 times sit to stand: unable w/o B UE assist; 27.56 sec with B UE assist   TODAY'S TREATMENT:    06/30/2024  SELF CARE:  Provided education on home TENS unit set-up and use, providing instruction in electrode placement, setting adjustment and intensity adjustment to strong but comfortable level. Reviewed recommended use  schedule, avoiding continuous use.  TENS (AUVON unit) to B low back in Intersecting pattern, intensity to patient tolerance x 40 min during PT session in conjunction with moist heat pack to low back during warm-up on bike (non-billable)  THERAPEUTIC EXERCISE: To improve strength, endurance, and flexibility.  Demonstration, verbal and tactile cues throughout for technique.  Rec Bike - L2 x 6' Lumbar extension in standing with forearms on wall 3 x 10 Plank position on wall + alt hip extension 2 x 10 Plank position on wall + alt hip ABD 2 x 10 Lumbar extension in standing with back of hips against counter x 10 - prefers extension at wall  NEUROMUSCULAR RE-EDUCATION: To improve strength, coordination, kinesthesia, and posture. Standing TrA + scap retraction + GTB rows 2 x 10 Standing TrA + scap retraction + GTB B shoulder ER 2 x 10 TRX rows 2 x 10 - cues for abdominal muscle engagement to maintain neutral spine    06/24/2024  GAIT TRAINING: To normalize gait pattern and improve safety with SPC.  180' with SPC - cues to use SPC on L to offload R LE + cues for sequencing of SPC and step pattern to allow for more normal step-through pattern  SELF CARE:   Brief review of proper posture and body mechanics for typical daily positioning, especially supine positioning for sleeping with pillow behind knees and relatively thin pillow +/- towel roll under neck for neck support.  THERAPEUTIC EXERCISE: To improve strength, endurance, ROM, and flexibility.  Demonstration, verbal and tactile cues throughout for technique.  All exercises performed in sitting with moist heat pack on lumbar spine. Rec Bike - L2 x 6' Seated abdominal bracing with press-down into ball on lap 10 x 5 Seated TrA + scap retraction + GTB rows 2 x 10 Seated TrA + scap retraction + GTB B shoulder ER 2 x 10 Seated GTB pallof press x 15 B Attempted seated 3-way lumbar flexion stretch with UE support on SPC but deferred d/t increased  pain   06/16/2024  THERAPEUTIC EXERCISE: To improve strength and endurance.  Demonstration, verbal and tactile cues throughout for technique. Rec Bike - L2 x 6'  NEUROMUSCULAR RE-EDUCATION: To improve coordination, kinesthesia, posture, and proprioception.  Hooklying sciatic nerve glide Hooklying PPT + GTB alt hip ABD/ER bent-knee fallouts 10 x 3-5, 2 sets - cues to control band on return to neutral position GTB hip flexion march 2 x 10 Bridge + GTB hip ABD isometric x 10 - limited lift Deadbug x 10 90/90 sequential march x 5 leading with each LE GTB B scap retraction + shoulder horizontal ABD 10 x  3-5 GTB B scap retraction + shoulder horizontal ABD diagonals 10 x 3-5 Staggered stance + GTB scap retraction + B shoulder row 2 x 10 B shoulder extension 2 x 10 Standing GTB pallof press x 10 B GTB multifidi walkouts x 10 B   06/10/24 Rec Bike L2x8min Bird dog orange pball x 10 Bent over orange pball shoulder extension IASTM with foam roll to low back and glutes Seated lumbar flexion stretch with orange pball Seated rows 15lb x 10 BUE Moist heat to low back in sitting x 15 min   06/03/24  THERAPEUTIC EXERCISE: To improve strength, endurance, ROM, and flexibility.  Demonstration, verbal and tactile cues throughout for technique.  Rec Bike - L2 x 6' Standing lumbar extension with forearms on wall 10 x 5  MANUAL THERAPY: To promote normalized muscle tension, improved flexibility, and reduced pain utilizing connective tissue massage, therapeutic massage, manual TP therapy, and percussion massage with massage gun.  STM/DTM and percussion massage to R>L UT, LS and scalenes  SELF CARE: Provided education on post-surgical precautions and to facilitate performance of basic household cleaning/chores. Provided education in proper posture and body mechanics for typical daily positioning and household chores to minimize strain on low back and neck, emphasizing BLT precautions for the  immediate post-op period.  NEUROMUSCULAR RE-EDUCATION: To improve coordination, kinesthesia, posture, and proprioception.  Quadruped over orange Pball: Alt LE/hip extension x 10 - VC & TC to maintain neutral spine avoiding trunk rotation Alt UE raises x 10   05/27/24  THERAPEUTIC EXERCISE: To improve strength, endurance, ROM, and flexibility.  Demonstration, verbal and tactile cues throughout for technique.  UBE - L2.0 x 3' each fwd & back Supine R/L HS stretch with strap 1 x 30 with opp LE straight, 3 x 30 with opp knee flexed (preferred) Supine SKTC 3 x 30 bil Hooklying LTR 10 x 5 Hooklying TrA progressing to PPT 10 x 5  NEUROMUSCULAR RE-EDUCATION: To improve coordination, kinesthesia, and posture. Hooklying PPT + GTB B scap retraction + shoulder horizontal ABD 10 x 3-5 GTB alt hip ABD/ER bent-knee fallouts 10 x 3-5 , 2 sets - cues to control band on return to neutral position Attempted bridge + GTB hip ABD isometric but deferred due to increased pain even with limited lift GTB hip flexion march 2 x 10 Hip ADD isometric ball squeeze 10 x 5, 2 sets  SELF CARE: Provided education on post-surgical precautions and to increase independence with ADLs. Provided initial introduction to BLT precautions and review log rolling and sidelying to sit transitions to reduce low back strain. Reviewed recommended sleeping positions, avoiding prone due to strain on neck and suggesting sidelying with pillow between knees to promote neutral spine alignment.   05/20/24 THERAPEUTIC EXERCISE: To improve strength, endurance, ROM, and flexibility.  UBE L2.0 3 min fwd/ 3 min back Bike L2x50min Seated UT and levator stretches reviewed Seated lumbar flexion stretch touching floor 2x30' Supine LTR both ways 10x5' Supine SKTC R/L 2 x 30' Supine HS stretch 2x30' BLE w/ strap Seated ab sets orange pball 10x5'   05/13/2024 - Eval SELF CARE:  Reviewed eval findings and role of PT in addressing  identified deficits as well as instruction in initial HEP (see below).  Patient inquiring about use of estim which is not covered by his [employer], therefore provided information on a home TENS unit for self-purchase.   PATIENT EDUCATION:  Education details: HEP modification - focus switched to standing exercises  Person educated: Patient Education  method: Explanation, Demonstration, Verbal cues, and MedBridgeGO app updated Education comprehension: verbalized understanding, returned demonstration, verbal cues required, and needs further education  HOME EXERCISE PROGRAM: *Pt using MedBridgeGO app.  Access Code: [email or web address] URL: [email or web address] Date: 06/30/2024 Prepared by: Elijah Hidden  Exercises - Seated Gentle Upper Trapezius Stretch (Mirrored)  - 2-3 x daily - 7 x weekly - 3 reps - 30 sec hold - Gentle Levator Scapulae Stretch  - 2-3 x daily - 7 x weekly - 3 reps - 30 sec hold - Seated 3 Way Exercise Ball Roll Out Stretch  - 2-3 x daily - 7 x weekly - 10 reps - 5 sec hold - Supine Lower Trunk Rotation  - 2-3 x daily - 7 x weekly - 10 reps - 5-10 sec hold - Hooklying Hamstring Stretch with Strap  - 2-3 x daily - 7 x weekly - 3 reps - 30 sec hold - Hooklying Single Knee to Chest Stretch  - 2-3 x daily - 7 x weekly - 3 reps - 30 sec hold - Supine Posterior Pelvic Tilt  - 1 x daily - 7 x weekly - 2 sets - 10 reps - 5 sec hold - Supine Hip Adduction Isometric with Ball  - 1 x daily - 7 x weekly - 2 sets - 10 reps - 5 sec hold - Supine Shoulder Horizontal Abduction with Resistance  - 1 x daily - 7 x weekly - 2 sets - 10 reps - 3 sec hold - Hooklying Single Leg Bent Knee Fallouts with Resistance  - 1 x daily - 7 x weekly - 2 sets - 10 reps - 3 sec hold - Supine March with Resistance Band  - 1 x daily - 7 x weekly - 2 sets - 10 reps - 2-3 sec hold hold - Standing Lumbar Extension at Wall - Forearms  - 1 x daily - 7 x weekly - 2 sets - 10 reps - 3 sec hold -  Standing Shoulder Row with Anchored Resistance  - 1 x daily - 7 x weekly - 2 sets - 10 reps - 5 sec hold - Scapular Retraction with Resistance Advanced  - 1 x daily - 7 x weekly - 2 sets - 10 reps - 5 sec hold - Standing Hip Abduction with Counter Support  - 1 x daily - 7 x weekly - 2 sets - 10 reps - 2-3 sec hold - Standing Hip Extension with Counter Support  - 1 x daily - 7 x weekly - 2 sets - 10 reps - 2-3 sec hold  Patient Education - TENS Therapy - TENS UNIT - AUVON Dual Channel TENS Unit - Post-Op Back Precautions - Posture and Body Mechanics   ASSESSMENT:  CLINICAL IMPRESSION: Brett Allen reports his back pain is not quite as severe today but is aggravated by any transitional  movements.  He was started on prednisone  by his PCP last week and given an injection for pain but has not noted much change.  He brought a new home TENS unit today, therefore provided instruction on set-up and use, using the unit during today's exercises to help improve activity tolerance.  Preference noted for lumbar extension today with poor tolerance for any flexion, therefore all exercises focusing on core, postural and hip strengthening from standing.  HEP updated to reflect current positional preference as he has not been able to perform the supine exercises due to increased pain with transitional movement.  He will f/u with Dr. Gillie tomorrow and he  wishes to wait to schedule any further PT appointments until he knows what the MD has planned.  Brett Allen will benefit from continued skilled PT to address ongoing cervical and lumbar ROM/flexibility, postural, core and proximal UE/LE strength deficits to improve mobility and activity tolerance with decreased pain interference.    EVAL: Brett Allen is a 49 y.o. male who was referred to physical therapy for evaluation and treatment for acute L-sided neck pain and chronic R-sided lumbar pain with intermittent R LE radicular pain, numbness and tingling.  He also reports  chronic R shoulder pain and limited ROM following surgery ~1 yr ago s/p chronic dislocations.   Patient reports onset of L-sided neck pain beginning ~1 month ago while brushing his teeth.  Neck pain is intermittent, worse with sleeping positions and turning his head.  Patient reports onset of R-sided low back pain 17+ years ago, exacerbated 2-3 yrs ago with a job requiring repetitive bending, lifting and twisting. Low back pain is chronic and constant, with electric shock down R LE to knee and ankle starting ~2 months ago, worse with prolonged sitting or standing.  Patient has deficits in cervical and lumber ROM, B LE flexibility, core/postural and B UE/LE strength, abnormal posture, and TTP with abnormal muscle tension which are interfering with ADLs and are impacting quality of life.  On NDI patient scored 32/50 demonstrating 64% or severe disability.  On Modified Oswestry patient scored 29/50 demonstrating 58% or severe disability.  Brett Allen will benefit from skilled PT to address above deficits to improve mobility and activity tolerance with decreased pain interference.  OBJECTIVE IMPAIRMENTS: Abnormal gait, decreased activity tolerance, decreased endurance, decreased knowledge of condition, decreased mobility, difficulty walking, decreased ROM, decreased strength, hypomobility, increased fascial restrictions, impaired perceived functional ability, increased muscle spasms, impaired flexibility, impaired sensation, impaired UE functional use, improper body mechanics, postural dysfunction, and pain.   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   REHAB POTENTIAL: Good  CLINICAL DECISION MAKING: Unstable/unpredictable  EVALUATION COMPLEXITY: High   GOALS: Goals reviewed with patient? Yes  SHORT TERM GOALS: Target date: 06/10/2024  Patient will be independent with initial HEP to improve outcomes and carryover.  Baseline: HEP initiated on eval Goal status: MET - 05/27/24  2.  Patient will report 25% improvement in neck and low back pain to improve QOL. Baseline: Neck = 5/10 & Low back = 7/10 on eval, both up to 10/10 at worst 06/03/24 - pain essentially unchanged,except neck pain slightly worse (likely triggered by sleeping in prone) 06/16/24 - neck 50% improved but low back pain essentially unchanged Goal status: PARTIALLY MET - 06/30/24 - pain remains variable, better this week than last  3.  Patient will demonstrate improved posture to decrease muscle imbalance. Baseline: forward head, rounded shoulders, decreased lumbar lordosis, flexed trunk, and weight shift left 06/03/24 - improving posture in standing but still with tendency for fwd head, rounded shoulders and increased trunk flexion in sitting Goal status: MET - 06/16/24 - better postural awareness with benefit noted from wearing LSO  LONG TERM GOALS: Target date: 07/08/2024  Patient will be independent with ongoing/advanced  HEP for self-management at home.  Baseline:  06/16/24 - in progress  Goal status: IN PROGRESS - 06/30/24 - HEP modified to focus more on standing exercises based on pt's current tolernance  2.  Patient will report 50-75% improvement in neck and low back pain to improve QOL.  Baseline: Neck = 5/10 & Low back = 7/10 on eval, both up to 10/10 at worst 06/16/24 - neck 50% improved but low back pain essentially unchanged Goal status: IN  PROGRESS - 06/30/24 - pain remains variable, better this week than last   3.  Patient to demonstrate ability to achieve and maintain good spinal alignment and body mechanics needed for daily activities. Baseline:  06/03/24 - education provided in proper posture and body mechanics for typical daily positioning and household chores to minimize strain on low back and neck as well as post-op BLT precautions  06/16/24 - better postural awareness with benefit noted from wearing LSO, pt reports he has been trying to incorporate the posture and body mechanics training in to his daily mobility and sleeping positions Goal status: IN PROGRESS - 06/30/24 - Pt continues to rely on LSO to help maintain neutral spine  4.  Patient will demonstrate functional pain free cervical ROM for safety with driving.  Baseline: Refer to above cervical ROM table Goal status: INITIAL  5.  Patient will demonstrate functional pain free lumbar ROM to perform ADLs.   Baseline: Refer to above lumbar ROM table Goal status: INITIAL  7.  Patient will demonstrate improved functional strength as demonstrated by improved B postural and UE/LE strength to >/= 4 to 4+/5. Baseline: Refer to above UE/LE MMT tables Goal status: INITIAL  6.  Patient will report </= 54% on NDI (MCID = 10%) to demonstrate improved functional ability.  Baseline: 32 / 50 = 64.0 % Goal status: INITIAL   7.  Patient will report </= 46% on Modified Oswestry (MCID = 12%) to demonstrate improved functional ability.  Baseline: 29 / 50 = 58.0 % Goal status: INITIAL  8.  Patient to report ability to perform ADLs, household, and work-related tasks without need for assistance of his wife. Baseline: needs assistance of wife with washing his back and most household chores Goal status: INITIAL   9.  Patient will tolerate >30 min of sitting or standing w/o increase in pain or radicular symptoms to allow for increased ease of ADL performance. Baseline: Back pain  prevents sitting or standing for >30 minutes Goal status: INITIAL  10.  Patient will report centralization of R LE radicular symptoms.  Baseline: electric shock down R LE to knee and ankle Goal status: INITIAL   PLAN:  PT FREQUENCY: 2x/week  PT DURATION: 8 weeks  PLANNED INTERVENTIONS: 02835- PT Re-evaluation, 97750- Physical Performance Testing, 97110-Therapeutic exercises, 97530- Therapeutic activity, 97112- Neuromuscular re-education, 97535- Self Care, 02859- Manual therapy, [phone]- Gait training, [phone]- Aquatic Therapy, [phone]- Ultrasound, [phone] (1-2 muscles), [phone] (3+ muscles)- Dry Needling, Patient/Family education, Balance training, Stair training, Taping, Joint mobilization, Spinal mobilization, Cryotherapy, and Moist heat  PLAN FOR NEXT SESSION: Recert if MD wants him to continue with PT; gently progress cervical and lumbar ROM/stretching; review & progress HEP PRN; MT +/- TPDN to address TPs/abnormal muscle tension in L UT, lumbar paraspinals, glues and piriformis; review posture & body mechanics education and BLT precautions PRN    Elijah CHRISTELLA Hidden, PT 06/30/2024, 11:24 AM  "

## 2024-07-01 ENCOUNTER — Other Ambulatory Visit (HOSPITAL_BASED_OUTPATIENT_CLINIC_OR_DEPARTMENT_OTHER): Payer: Self-pay

## 2024-07-01 MED ORDER — ACETAMINOPHEN-CODEINE 300-30 MG PO TABS
1.0000 | ORAL_TABLET | Freq: Four times a day (QID) | ORAL | 0 refills | Status: AC | PRN
  Filled 2024-07-01 (×2): qty 60, 15d supply, fill #0
  Filled 2024-07-01: qty 20, 5d supply, fill #0
  Filled 2024-07-09: qty 20, 5d supply, fill #1
  Filled 2024-07-16: qty 20, 5d supply, fill #2

## 2024-07-09 ENCOUNTER — Other Ambulatory Visit: Payer: Self-pay

## 2024-07-09 ENCOUNTER — Ambulatory Visit: Payer: Self-pay | Admitting: Medical

## 2024-07-09 ENCOUNTER — Telehealth (HOSPITAL_BASED_OUTPATIENT_CLINIC_OR_DEPARTMENT_OTHER): Payer: Self-pay

## 2024-07-09 ENCOUNTER — Other Ambulatory Visit (HOSPITAL_BASED_OUTPATIENT_CLINIC_OR_DEPARTMENT_OTHER): Payer: Self-pay

## 2024-07-09 ENCOUNTER — Other Ambulatory Visit: Payer: Self-pay | Admitting: Medical

## 2024-07-09 ENCOUNTER — Ambulatory Visit: Admitting: Medical

## 2024-07-09 ENCOUNTER — Encounter: Payer: Self-pay | Admitting: Medical

## 2024-07-09 VITALS — BP 139/80 | HR 76 | Temp 98.0°F | Resp 16 | Ht 70.0 in | Wt 216.2 lb

## 2024-07-09 DIAGNOSIS — K5900 Constipation, unspecified: Secondary | ICD-10-CM | POA: Diagnosis not present

## 2024-07-09 DIAGNOSIS — Z7985 Long-term (current) use of injectable non-insulin antidiabetic drugs: Secondary | ICD-10-CM

## 2024-07-09 DIAGNOSIS — E119 Type 2 diabetes mellitus without complications: Secondary | ICD-10-CM

## 2024-07-09 DIAGNOSIS — M5416 Radiculopathy, lumbar region: Secondary | ICD-10-CM | POA: Diagnosis not present

## 2024-07-09 DIAGNOSIS — E669 Obesity, unspecified: Secondary | ICD-10-CM

## 2024-07-09 DIAGNOSIS — I10 Essential (primary) hypertension: Secondary | ICD-10-CM

## 2024-07-09 DIAGNOSIS — Z6830 Body mass index (BMI) 30.0-30.9, adult: Secondary | ICD-10-CM

## 2024-07-09 DIAGNOSIS — R110 Nausea: Secondary | ICD-10-CM | POA: Diagnosis not present

## 2024-07-09 DIAGNOSIS — M5126 Other intervertebral disc displacement, lumbar region: Secondary | ICD-10-CM

## 2024-07-09 LAB — LIPASE: Lipase: 22 U/L (ref 11.0–59.0)

## 2024-07-09 MED ORDER — AMLODIPINE BESYLATE 10 MG PO TABS
10.0000 mg | ORAL_TABLET | Freq: Every day | ORAL | 0 refills | Status: AC
  Filled 2024-07-09: qty 90, 90d supply, fill #0

## 2024-07-09 MED ORDER — GABAPENTIN 300 MG PO CAPS
300.0000 mg | ORAL_CAPSULE | Freq: Three times a day (TID) | ORAL | 0 refills | Status: AC
  Filled 2024-07-09: qty 90, 30d supply, fill #0

## 2024-07-09 MED ORDER — VALSARTAN 320 MG PO TABS
320.0000 mg | ORAL_TABLET | Freq: Every day | ORAL | 0 refills | Status: AC
  Filled 2024-07-09: qty 90, 90d supply, fill #0

## 2024-07-09 MED ORDER — TIRZEPATIDE 2.5 MG/0.5ML ~~LOC~~ SOAJ
2.5000 mg | SUBCUTANEOUS | 0 refills | Status: AC
  Filled 2024-07-09 – 2024-07-14 (×3): qty 2, 28d supply, fill #0

## 2024-07-09 MED ORDER — VALACYCLOVIR HCL 1 G PO TABS
1000.0000 mg | ORAL_TABLET | Freq: Two times a day (BID) | ORAL | 2 refills | Status: AC | PRN
  Filled 2024-07-09: qty 14, 7d supply, fill #0

## 2024-07-09 NOTE — Patient Instructions (Signed)
 Type 2 diabetes mellitus A1c at 6.2%. Weight gain and  since switching to Ozempic . Nausea and constipation present with Ozempic , absent with Mounjaro . Prefers Mounjaro  for better weight management and fewer side effects. - Submitted request to switch back to Mounjaro . - Scheduled A1c and metabolic panel for February 4th.  Obesity Weight gain to 215-216 lbs, BMI 30.65, since switching to Ozempic . Prefers Mounjaro  for weight management. Weight gain worsens lumbar radiculopathy. - Submitted request to switch back to Mounjaro  for weight management.  Lumbar radiculopathy Increased pain and symptom frequency with weight gain. Pain radiates below knee, suggesting progression. Current gabapentin  insufficient. - Increased gabapentin  dosage to two tablets three times a day. -back pain much less when weight was 200 lb.  Constipation Chronic constipation worsened by Ozempic . Bowel movements every four to five days. - Discussed potential side effects of Ozempic  with pharmacist. -hydrate well and mild exercise.  -can use miralax .    Essential hypertension Blood pressure improved with valsartan  and amlodipine . - Continue current antihypertensive regimen.  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 Previously managed with antiviral. - Prescribed Valtrex  with two refills for episodic treatment of cold sores.  Nausea Intermittent nausea post-Ozempic  administration. -lipase level today.  Follow up date to be determined after lipase review and depending if able to switch glp1

## 2024-07-09 NOTE — Telephone Encounter (Signed)
 Pharmacy Patient Advocate Encounter  Received notification from [employer] that Prior Authorization for Mounjaro  2.5MG /0.5ML auto-injectors  has been APPROVED from 07/09/24 to 07/09/25. Ran test claim, Copay is $25. This test claim was processed through [facility]- copay amounts may vary at other pharmacies due to pharmacy/plan contracts, or as the patient moves through the different stages of their insurance plan.   PA #/Case ID/Reference #: [id number]

## 2024-07-09 NOTE — Progress Notes (Signed)
 "  Subjective:    Patient ID: Brett Allen, male    DOB: 11-12-1975, 49 y.o.   MRN: [id number]  HPI  Brett Allen is a 49 year old male with type 2 diabetes who presents with concerns about medication efficacy and side effects.  He has had increasing blood sugars since switching from Mounjaro  to Ozempic  0.5 mg two months ago, with last A1c 6.2% on November 4. On Mounjaro  his blood sugars were more stable and he felt better. On Ozempic  he feels queasy(transient nause after injection) and has constipation with bowel movements every four to five days, and has gained about 15 pounds in two months, which he links to the medication change.  His weight had been stable around 200 pounds on Mounjaro . He now weighs 215 pounds and wants to return to 200 pounds. He notes his sciatica and back pain were less severe at the lower weight, and the recent weight gain has worsened these symptoms. He takes gabapentin , two tablets three times daily, but still has significant pain, especially in his leg starting under the knee and shooting down the leg.  He has intermittent cold sores and recently had a recurrence after about a year without one. He was previously prescribed medication for this, which he no longer has, and is requesting a refill.  He takes valsartan  320 mg and amlodipine  10 mg for hypertension and reports good blood pressure control on this regimen.     Review of Systems See hpi    Objective:   Physical Exam   General Mental Status- Alert. General Appearance- Not in acute distress.   Skin General: Color- Normal Color. Moisture- Normal Moisture.  Neck No JVD.  Chest and Lung Exam Auscultation: Breath Sounds:-CTA  Cardiovascular Auscultation:Rythm- RRR Murmurs & Other Heart Sounds:Auscultation of the heart reveals- No Murmurs.  Abdomen Inspection:-Inspeection Normal. Palpation/Percussion:Note:No mass. Palpation and Percussion of the abdomen reveal- Non Tender, Non  Distended + BS, no rebound or guarding.   Neurologic Cranial Nerve exam:- CN III-XII intact(No nystagmus), symmetric smile. Strength:- 5/5 equal and symmetric strength both upper and lower extremities.      Assessment & Plan:   Patient Instructions  Type 2 diabetes mellitus A1c at 6.2%. Weight gain and  since switching to Ozempic . Nausea and constipation present with Ozempic , absent with Mounjaro . Prefers Mounjaro  for better weight management and fewer side effects. - Submitted request to switch back to Mounjaro . - Scheduled A1c and metabolic panel for February 4th.  Obesity Weight gain to 215-216 lbs, BMI 30.65, since switching to Ozempic . Prefers Mounjaro  for weight management. Weight gain worsens lumbar radiculopathy. - Submitted request to switch back to Mounjaro  for weight management.  Lumbar radiculopathy Increased pain and symptom frequency with weight gain. Pain radiates below knee, suggesting progression. Current gabapentin  insufficient. - Increased gabapentin  dosage to two tablets three times a day. -back pain much less when weight was 200 lb.  Constipation Chronic constipation worsened by Ozempic . Bowel movements every four to five days. - Discussed potential side effects of Ozempic  with pharmacist. -hydrate well and mild exercise.  -can use miralax .    Essential hypertension Blood pressure improved with valsartan  and amlodipine . - Continue current antihypertensive regimen.  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 Previously managed with antiviral. - Prescribed Valtrex  with two refills for episodic treatment of cold sores.  Nausea Intermittent nausea post-Ozempic  administration. -lipase level today.  Follow up date to be determined after lipase review and depending if able to  switch glp1      Brett Lacko, PA-C  "

## 2024-07-11 ENCOUNTER — Other Ambulatory Visit (HOSPITAL_COMMUNITY): Payer: Self-pay

## 2024-07-11 ENCOUNTER — Other Ambulatory Visit (HOSPITAL_BASED_OUTPATIENT_CLINIC_OR_DEPARTMENT_OTHER): Payer: Self-pay

## 2024-07-14 ENCOUNTER — Other Ambulatory Visit: Payer: Self-pay

## 2024-07-14 ENCOUNTER — Other Ambulatory Visit (HOSPITAL_BASED_OUTPATIENT_CLINIC_OR_DEPARTMENT_OTHER): Payer: Self-pay

## 2024-07-16 ENCOUNTER — Other Ambulatory Visit: Payer: Self-pay

## 2024-07-16 NOTE — Progress Notes (Signed)
 Surgical Instructions   Your procedure is scheduled on Thursday, February 5th, 2026. Report to [facility] Main Entrance A at 11:00 A.M., then check in with the Admitting office. Any questions or running late day of surgery: call [phone]  Questions prior to your surgery date: call [phone], Monday-Friday, 8am-4pm. If you experience any cold or flu symptoms such as cough, fever, chills, shortness of breath, etc. between now and your scheduled surgery, please notify us  at the above number.     Remember:  Do not eat or drink after midnight the night before your surgery     Take these medicines the morning of surgery with A SIP OF WATER: Amlodipine  (Norvasc ) Bupropion (Wellbutrin XL) Clindamycin  (Cleocin ) Gabapentin  (Neurontin )   May take these medicines IF NEEDED: Acetaminophen -codeine  (Tylenol  #3) Valacyclovir  (Valtrex )    One week prior to surgery, STOP taking any Aspirin (unless otherwise instructed by your surgeon) Aleve , Naproxen , Ibuprofen , Motrin , Advil , Goody's, BC's, all herbal medications, fish oil, and non-prescription vitamins.    WHAT DO I DO ABOUT MY DIABETES MEDICATION?   Semaglutide  (Ozempic ) AND Tirzepatide  (Mounjaro ) should be held for 7 days prior to your procedure.  Your last dose should be on or before Wednesday, January 28th.       HOW TO MANAGE YOUR DIABETES BEFORE AND AFTER SURGERY  Why is it important to control my blood sugar before and after surgery? Improving blood sugar levels before and after surgery helps healing and can limit problems. A way of improving blood sugar control is eating a healthy diet by:  Eating less sugar and carbohydrates  Increasing activity/exercise  Talking with your doctor about reaching your blood sugar goals High blood sugars (greater than 180 mg/dL) can raise your risk of infections and slow your recovery, so you will need to focus on controlling your diabetes during the weeks before surgery. Make sure  that the doctor who takes care of your diabetes knows about your planned surgery including the date and location.  How do I manage my blood sugar before surgery? Check your blood sugar at least 4 times a day, starting 2 days before surgery, to make sure that the level is not too high or low.  Check your blood sugar the morning of your surgery when you wake up and every 2 hours until you get to the Short Stay unit.  If your blood sugar is less than 70 mg/dL, you will need to treat for low blood sugar: Do not take insulin . Treat a low blood sugar (less than 70 mg/dL) with  cup of clear juice (cranberry or apple), 4 glucose tablets, OR glucose gel. Recheck blood sugar in 15 minutes after treatment (to make sure it is greater than 70 mg/dL). If your blood sugar is not greater than 70 mg/dL on recheck, call [phone] for further instructions. Report your blood sugar to the short stay nurse when you get to Short Stay.  If you are admitted to the hospital after surgery: Your blood sugar will be checked by the staff and you will probably be given insulin  after surgery (instead of oral diabetes medicines) to make sure you have good blood sugar levels. The goal for blood sugar control after surgery is 80-180 mg/dL.                      Do NOT Smoke (Tobacco/Vaping) for 24 hours prior to your procedure.  If you use a CPAP at night, you may bring your mask/headgear for your overnight  stay.   You will be asked to remove any contacts, glasses, piercing's, hearing aid's, dentures/partials prior to surgery. Please bring cases for these items if needed.    Your surgeon will determine if you are to be admitted or discharged the same day.  Patients discharged the day of surgery will not be allowed to drive home, and someone needs to stay with them for 24 hours.  SURGICAL WAITING ROOM VISITATION Patients may have no more than 2 support people in the waiting area - these visitors may rotate.   Pre-op  nurse will coordinate an appropriate time for 2 ADULT support persons, who may not rotate, to accompany patient in pre-op.  Children under the age of 60 must have an adult with them who is not the patient and must remain in the main waiting area with an adult.  If the patient needs to stay at the hospital during part of their recovery, the visitor guidelines for inpatient rooms apply.  Please refer to the [facility] website for the visitor guidelines for any additional information.   If you received a COVID test during your pre-op visit  it is requested that you wear a mask when out in public, stay away from anyone that may not be feeling well and notify your surgeon if you develop symptoms. If you have been in contact with anyone that has tested positive in the last 10 days please notify you surgeon.      Pre-operative 4 CHG Bathing Instructions   You can play a key role in reducing the risk of infection after surgery. Your skin needs to be as free of germs as possible. You can reduce the number of germs on your skin by washing with CHG (chlorhexidine gluconate) soap before surgery. CHG is an antiseptic soap that kills germs and continues to kill germs even after washing.   DO NOT use if you have an allergy to chlorhexidine/CHG or antibacterial soaps. If your skin becomes reddened or irritated, stop using the CHG and notify one of our RNs at [phone].   Please shower with the CHG soap starting 4 days before surgery using the following schedule:     Please keep in mind the following:  DO NOT shave, including legs and underarms, starting the day of your first shower.   You may shave your face at any point before/day of surgery.  Place clean sheets on your bed the day you start using CHG soap. Use a clean washcloth (not used since being washed) for each shower. DO NOT sleep with pets once you start using the CHG.   CHG Shower Instructions:  Wash your face and private area with  normal soap. If you choose to wash your hair, wash first with your normal shampoo.  After you use shampoo/soap, rinse your hair and body thoroughly to remove shampoo/soap residue.  Turn the water OFF and apply  bottle of CHG soap to a CLEAN washcloth.  Apply CHG soap ONLY FROM YOUR NECK DOWN TO YOUR TOES (washing for 3-5 minutes)  DO NOT use CHG soap on face, private areas, open wounds, or sores.  Pay special attention to the area where your surgery is being performed.  If you are having back surgery, having someone wash your back for you may be helpful. Wait 2 minutes after CHG soap is applied, then you may rinse off the CHG soap.  Pat dry with a clean towel  Put on clean clothes/pajamas   If you choose to wear  lotion, please use ONLY the CHG-compatible lotions that are listed below.  Additional instructions for the day of surgery:  If you choose, you may shower the morning of surgery with an antibacterial soap.  DO NOT APPLY any lotions, deodorants, cologne, or perfumes.   Do not bring valuables to the hospital. [facility] is not responsible for any belongings/valuables. Do not wear nail polish, gel polish, artificial nails, or any other type of covering on natural nails (fingers and toes) Do not wear jewelry or makeup Put on clean/comfortable clothes.  Please brush your teeth.  Ask your nurse before applying any prescription medications to the skin.     CHG Compatible Lotions   Aveeno Moisturizing lotion  Cetaphil Moisturizing Cream  Cetaphil Moisturizing Lotion  Clairol Herbal Essence Moisturizing Lotion, Dry Skin  Clairol Herbal Essence Moisturizing Lotion, Extra Dry Skin  Clairol Herbal Essence Moisturizing Lotion, Normal Skin  Curel Age Defying Therapeutic Moisturizing Lotion with Alpha Hydroxy  Curel Extreme Care Body Lotion  Curel Soothing Hands Moisturizing Hand Lotion  Curel Therapeutic Moisturizing Cream, Fragrance-Free  Curel Therapeutic Moisturizing Lotion,  Fragrance-Free  Curel Therapeutic Moisturizing Lotion, Original Formula  Eucerin Daily Replenishing Lotion  Eucerin Dry Skin Therapy Plus Alpha Hydroxy Crme  Eucerin Dry Skin Therapy Plus Alpha Hydroxy Lotion  Eucerin Original Crme  Eucerin Original Lotion  Eucerin Plus Crme Eucerin Plus Lotion  Eucerin TriLipid Replenishing Lotion  Keri Anti-Bacterial Hand Lotion  Keri Deep Conditioning Original Lotion Dry Skin Formula Softly Scented  Keri Deep Conditioning Original Lotion, Fragrance Free Sensitive Skin Formula  Keri Lotion Fast Absorbing Fragrance Free Sensitive Skin Formula  Keri Lotion Fast Absorbing Softly Scented Dry Skin Formula  Keri Original Lotion  Keri Skin Renewal Lotion Keri Silky Smooth Lotion  Keri Silky Smooth Sensitive Skin Lotion  Nivea Body Creamy Conditioning Oil  Nivea Body Extra Enriched Lotion  Nivea Body Original Lotion  Nivea Body Sheer Moisturizing Lotion Nivea Crme  Nivea Skin Firming Lotion  NutraDerm 30 Skin Lotion  NutraDerm Skin Lotion  NutraDerm Therapeutic Skin Cream  NutraDerm Therapeutic Skin Lotion  ProShield Protective Hand Cream  Provon moisturizing lotion  Please read over the following fact sheets that you were given.

## 2024-07-17 ENCOUNTER — Encounter (HOSPITAL_COMMUNITY): Payer: Self-pay

## 2024-07-17 ENCOUNTER — Other Ambulatory Visit: Payer: Self-pay

## 2024-07-17 ENCOUNTER — Encounter (HOSPITAL_COMMUNITY)
Admission: RE | Admit: 2024-07-17 | Discharge: 2024-07-17 | Disposition: A | Discharge status: Home / Self Care | Source: Ambulatory Visit | Attending: Neurosurgery | Admitting: Neurosurgery

## 2024-07-17 VITALS — BP 145/101 | HR 83 | Temp 97.7°F | Resp 18 | Ht 70.0 in

## 2024-07-17 DIAGNOSIS — Z01818 Encounter for other preprocedural examination: Secondary | ICD-10-CM | POA: Diagnosis present

## 2024-07-17 DIAGNOSIS — Z9889 Other specified postprocedural states: Secondary | ICD-10-CM | POA: Insufficient documentation

## 2024-07-17 DIAGNOSIS — I10 Essential (primary) hypertension: Secondary | ICD-10-CM | POA: Insufficient documentation

## 2024-07-17 DIAGNOSIS — E119 Type 2 diabetes mellitus without complications: Secondary | ICD-10-CM | POA: Diagnosis not present

## 2024-07-17 DIAGNOSIS — Z01812 Encounter for preprocedural laboratory examination: Secondary | ICD-10-CM | POA: Insufficient documentation

## 2024-07-17 DIAGNOSIS — Z87442 Personal history of urinary calculi: Secondary | ICD-10-CM | POA: Insufficient documentation

## 2024-07-17 DIAGNOSIS — Z7985 Long-term (current) use of injectable non-insulin antidiabetic drugs: Secondary | ICD-10-CM | POA: Diagnosis not present

## 2024-07-17 DIAGNOSIS — D8689 Sarcoidosis of other sites: Secondary | ICD-10-CM | POA: Insufficient documentation

## 2024-07-17 DIAGNOSIS — Z87891 Personal history of nicotine dependence: Secondary | ICD-10-CM | POA: Insufficient documentation

## 2024-07-17 DIAGNOSIS — M4316 Spondylolisthesis, lumbar region: Secondary | ICD-10-CM | POA: Insufficient documentation

## 2024-07-17 HISTORY — DX: Personal history of urinary calculi: Z87.442

## 2024-07-17 LAB — BASIC METABOLIC PANEL WITH GFR
Anion gap: 9 (ref 5–15)
BUN: 12 mg/dL (ref 6–20)
CO2: 31 mmol/L (ref 22–32)
Calcium: 9.7 mg/dL (ref 8.9–10.3)
Chloride: 99 mmol/L (ref 98–111)
Creatinine, Ser: 1.13 mg/dL (ref 0.61–1.24)
GFR, Estimated: >60 mL/min
Glucose, Bld: 225 mg/dL — ABNORMAL HIGH (ref 70–99)
Potassium: 3.9 mmol/L (ref 3.5–5.1)
Sodium: 138 mmol/L (ref 135–145)

## 2024-07-17 LAB — CBC
HCT: 46.4 % (ref 39.0–52.0)
Hemoglobin: 15.5 g/dL (ref 13.0–17.0)
MCH: 25.9 pg — ABNORMAL LOW (ref 26.0–34.0)
MCHC: 33.4 g/dL (ref 30.0–36.0)
MCV: 77.5 fL — ABNORMAL LOW (ref 80.0–100.0)
Platelets: 305 10*3/uL (ref 150–400)
RBC: 5.99 MIL/uL — ABNORMAL HIGH (ref 4.22–5.81)
RDW: 13.2 % (ref 11.5–15.5)
WBC: 6.4 10*3/uL (ref 4.0–10.5)
nRBC: 0 % (ref 0.0–0.2)

## 2024-07-17 LAB — HEMOGLOBIN A1C
Hgb A1c MFr Bld: 7.9 % — ABNORMAL HIGH (ref 4.8–5.6)
Mean Plasma Glucose: 180.03 mg/dL

## 2024-07-17 LAB — SURGICAL PCR SCREEN
MRSA, PCR: POSITIVE — AB
Staphylococcus aureus: POSITIVE — AB

## 2024-07-17 LAB — GLUCOSE, CAPILLARY: Glucose-Capillary: 238 mg/dL — ABNORMAL HIGH (ref 70–99)

## 2024-07-17 NOTE — Progress Notes (Addendum)
 Check your BP one to times a day.  If the systolic(top number)is consistently above 165 or the diastolic (bottom number) is consistently above 100, contact your primary care provider. Your blood pressure medicine may need to be adjusted prior to surgery.  A copy of these instructions was given to the patient.

## 2024-07-17 NOTE — Progress Notes (Addendum)
 PCP - [place] Dorina RIGGERS Cardiologist - denies  PPM/ICD - denies Device Orders -  Rep Notified -   Chest x-ray - na EKG - 11/27/23 Stress Test - denies ECHO - denies Cardiac Cath - denies  Sleep Study - denies CPAP -   Fasting Blood Sugar - 130 Checks Blood Sugar once in a while  Last dose of GLP1 agonist-  07/16/24 GLP1 instructions: Hold Mounjaro  7 days prior to surgery.  Blood Thinner Instructions:na Aspirin Instructions:na  ERAS Protcol - NPO PRE-SURGERY Ensure or G2-   COVID TEST- na   Anesthesia review: yes- elevated BP.Allison Zelenak, PA-C notified.   Patient denies shortness of breath, fever, cough and chest pain at PAT appointment   All instructions explained to the patient, with a verbal understanding of the material. Patient agrees to go over the instructions while at home for a better understanding. The opportunity to ask questions was provided.

## 2024-07-18 NOTE — Anesthesia Preprocedure Evaluation (Signed)
"                                    Anesthesia Evaluation    Airway        Dental   Pulmonary former smoker          Cardiovascular hypertension,      Neuro/Psych    GI/Hepatic   Endo/Other  diabetes    Renal/GU      Musculoskeletal   Abdominal   Peds  Hematology   Anesthesia Other Findings   Reproductive/Obstetrics                              Anesthesia Physical Anesthesia Plan  ASA:   Anesthesia Plan:    Post-op Pain Management:    Induction:   PONV Risk Score and Plan:   Airway Management Planned:   Additional Equipment:   Intra-op Plan:   Post-operative Plan:   Informed Consent:   Plan Discussed with:   Anesthesia Plan Comments: (PAT note written 07/18/2024 by Graves Nipp, PA-C.  )        Anesthesia Quick Evaluation  "

## 2024-07-18 NOTE — Progress Notes (Signed)
 Anesthesia Chart Review:  Case: [id number] Date/Time: 07/24/24 1245   Procedure: LUMBAR PERCUTANEOUS PEDICLE SCREW 1 LEVEL - L3-L4 percutaneous screw placement, posterolateral arthrodesis   Anesthesia type: General   Diagnosis: Spondylolisthesis, lumbar region [M43.16]   Pre-op diagnosis: Spondylolisthesis, lumbar region   Location: [facility] OR ROOM 20 / [facility] OR   Surgeons: Gillie Duncans, MD       DISCUSSION: Patient is a 49 year old male scheduled for the above procedure.   History includes former smoker, HTN, DM2, Sarcoidosis (involving right eye), nephrolithiasis, neck lipoma (s/p excision 04/02/2023), right rotator cuff repair (04/2023).  BP 146/110 on arrival to PAT. Down to 145/101 on recheck. Had taken meds that morning. Recently saw PCP on 07/09/2024 with BP 140/100->139/80. He said he is able to monitor home BP. Advised to monitor his BP 1-2x/day between now and surgery and if readings consistently show SBP >165/DBP > 100 then to contact his PCP to consider medication adjustments. Back pain and on-going prednisone  (to finish 07/18/2024, prescribed for lumbar radicular pain), possibly contributing to elevated readings. He is on amlodipine  10 mg daily, valsartan  320 mg daily. He should continue to follow with primary care for improved long term BP control. He denied chest pain and SOB. EKG in June 2025 showed NSR.  A1c 7.9%. Last Mounjaro  07/16/2024.   Anesthesia team to evaluate on the day of surgery. Vitals on arrival.   VS: BP (!) 145/101   Pulse 83   Temp 36.5 C   Resp 18   Ht 5' 10 (1.778 m)   SpO2 98%   BMI 31.02 kg/m   BP Readings from Last 3 Encounters:  07/17/24 (!) 145/101  07/09/24 139/80  06/24/24 100/72    PROVIDERS: Dorina [place] RIGGERS is PCP    LABS: Preoperative labs noted. LFTs normal 06/09/2024. (all labs ordered are listed, but only abnormal results are displayed)  Labs Reviewed  SURGICAL PCR SCREEN - Abnormal; Notable for the following components:       Result Value   MRSA, PCR POSITIVE (*)    Staphylococcus aureus POSITIVE (*)    All other components within normal limits  GLUCOSE, CAPILLARY - Abnormal; Notable for the following components:   Glucose-Capillary 238 (*)    All other components within normal limits  HEMOGLOBIN A1C - Abnormal; Notable for the following components:   Hgb A1c MFr Bld 7.9 (*)    All other components within normal limits  BASIC METABOLIC PANEL WITH GFR - Abnormal; Notable for the following components:   Glucose, Bld 225 (*)    All other components within normal limits  CBC - Abnormal; Notable for the following components:   RBC 5.99 (*)    MCV 77.5 (*)    MCH 25.9 (*)    All other components within normal limits     IMAGES: MRI L-spine 05/06/2024: IMPRESSION: 1. Very age-advanced lumbar disc and endplate degeneration at L3-L4 and L4-L5, with superimposed mild degenerative-appearing endplate marrow edema at the former. Mild degenerative retrolisthesis at those levels and L5-S1. 2. But no significant lumbar spinal or lateral recess stenosis. And up to moderate associated neural foraminal stenosis at those levels does not appear significantly changed from a 2023 MRI.   CXR 06/20/2023: FINDINGS: The heart size and mediastinal contours are within normal limits. Both lungs are clear. The visualized skeletal structures are unremarkable. IMPRESSION: No active cardiopulmonary disease.   EKG: 11/27/2023: NSR   CV: N/A  Past Medical History:  Diagnosis Date   Back pain  lower   Cellulitis    Diabetes mellitus without complication (HCC)    History of kidney stones    Hypertension    Sarcoidosis    right eye    Past Surgical History:  Procedure Laterality Date   left wrsit surgery Left    Lipoma removal Right 03/2023   right neckline   ROTATOR CUFF REPAIR Right 04/2023   WRIST FRACTURE SURGERY Left 2017    MEDICATIONS:  tirzepatide  (MOUNJARO ) 2.5 MG/0.5ML Pen   acetaminophen -codeine   (TYLENOL  #3) 300-30 MG tablet   amLODipine  (NORVASC ) 10 MG tablet   buPROPion (WELLBUTRIN XL) 150 MG 24 hr tablet   cyclobenzaprine  (FLEXERIL ) 10 MG tablet   gabapentin  (NEURONTIN ) 300 MG capsule   ibuprofen  (ADVIL ) 800 MG tablet   mupirocin  ointment (BACTROBAN ) 2 %   potassium chloride  (KLOR-CON  M) 10 MEQ tablet   predniSONE  (DELTASONE ) 10 MG tablet   Semaglutide ,0.25 or 0.5MG /DOS, (OZEMPIC , 0.25 OR 0.5 MG/DOSE,) 2 MG/3ML SOPN   traZODone (DESYREL) 100 MG tablet   valACYclovir  (VALTREX ) 1000 MG tablet   valsartan  (DIOVAN ) 320 MG tablet   No current facility-administered medications for this encounter.    Isaiah Ruder, PA-C Surgical Short Stay/Anesthesiology [facility] Phone [phone] [facility] Phone [phone] 07/18/2024 4:06 PM

## 2024-07-23 ENCOUNTER — Other Ambulatory Visit (HOSPITAL_BASED_OUTPATIENT_CLINIC_OR_DEPARTMENT_OTHER): Payer: Self-pay

## 2024-07-23 ENCOUNTER — Other Ambulatory Visit

## 2024-07-23 DIAGNOSIS — E119 Type 2 diabetes mellitus without complications: Secondary | ICD-10-CM | POA: Diagnosis not present

## 2024-07-23 DIAGNOSIS — I10 Essential (primary) hypertension: Secondary | ICD-10-CM

## 2024-07-23 LAB — HEMOGLOBIN A1C: Hgb A1c MFr Bld: 8.3 % — ABNORMAL HIGH (ref 4.6–6.5)

## 2024-07-23 LAB — COMPREHENSIVE METABOLIC PANEL WITH GFR
ALT: 24 U/L (ref 3–53)
AST: 18 U/L (ref 5–37)
Albumin: 4.3 g/dL (ref 3.5–5.2)
Alkaline Phosphatase: 74 U/L (ref 39–117)
BUN: 12 mg/dL (ref 6–23)
CO2: 29 meq/L (ref 19–32)
Calcium: 9.3 mg/dL (ref 8.4–10.5)
Chloride: 102 meq/L (ref 96–112)
Creatinine, Ser: 1.15 mg/dL (ref 0.40–1.50)
GFR: 76.5 mL/min
Glucose, Bld: 233 mg/dL — ABNORMAL HIGH (ref 70–99)
Potassium: 3.6 meq/L (ref 3.5–5.1)
Sodium: 139 meq/L (ref 135–145)
Total Bilirubin: 0.4 mg/dL (ref 0.2–1.2)
Total Protein: 7.1 g/dL (ref 6.0–8.3)

## 2024-07-25 NOTE — Telephone Encounter (Signed)
 Pt apt scheduled for 07/29/2024

## 2024-07-29 ENCOUNTER — Ambulatory Visit: Admitting: Medical

## 2024-07-31 ENCOUNTER — Encounter (HOSPITAL_COMMUNITY): Payer: Self-pay | Admitting: Vascular Surgery

## 2024-07-31 ENCOUNTER — Encounter (HOSPITAL_COMMUNITY): Admission: RE | Payer: Self-pay | Source: Home / Self Care

## 2024-07-31 ENCOUNTER — Ambulatory Visit (HOSPITAL_COMMUNITY): Admission: RE | Admit: 2024-07-31 | Source: Home / Self Care | Admitting: Neurosurgery

## 2024-12-31 ENCOUNTER — Ambulatory Visit: Admitting: Physician Assistant
